# Patient Record
Sex: Male | Born: 1935 | Race: Black or African American | Hispanic: No
Health system: Southern US, Community
[De-identification: ages and names within clinical notes are randomized; demographics above are authoritative.]

## PROBLEM LIST (undated history)

## (undated) DIAGNOSIS — M1990 Unspecified osteoarthritis, unspecified site: Secondary | ICD-10-CM

## (undated) DIAGNOSIS — R7611 Nonspecific reaction to tuberculin skin test without active tuberculosis: Secondary | ICD-10-CM

## (undated) DIAGNOSIS — N289 Disorder of kidney and ureter, unspecified: Secondary | ICD-10-CM

## (undated) DIAGNOSIS — I10 Essential (primary) hypertension: Secondary | ICD-10-CM

## (undated) DIAGNOSIS — R7301 Impaired fasting glucose: Secondary | ICD-10-CM

## (undated) DIAGNOSIS — E119 Type 2 diabetes mellitus without complications: Secondary | ICD-10-CM

## (undated) DIAGNOSIS — N400 Enlarged prostate without lower urinary tract symptoms: Secondary | ICD-10-CM

## (undated) DIAGNOSIS — I739 Peripheral vascular disease, unspecified: Secondary | ICD-10-CM

## (undated) DIAGNOSIS — D649 Anemia, unspecified: Secondary | ICD-10-CM

## (undated) DIAGNOSIS — E785 Hyperlipidemia, unspecified: Secondary | ICD-10-CM

## (undated) DIAGNOSIS — K311 Adult hypertrophic pyloric stenosis: Secondary | ICD-10-CM

## (undated) DIAGNOSIS — R7309 Other abnormal glucose: Secondary | ICD-10-CM

## (undated) HISTORY — DX: Type 2 diabetes mellitus without complications: E11.9

## (undated) HISTORY — DX: Nonspecific reaction to tuberculin skin test without active tuberculosis: R76.11

## (undated) HISTORY — DX: Anemia, unspecified: D64.9

## (undated) HISTORY — DX: Other abnormal glucose: R73.09

## (undated) HISTORY — PX: COLONOSCOPY W/ POLYPECTOMY: SHX1380

## (undated) HISTORY — DX: Hyperlipidemia, unspecified: E78.5

## (undated) HISTORY — DX: Adult hypertrophic pyloric stenosis: K31.1

## (undated) HISTORY — PX: LIVER BIOPSY: SHX301

## (undated) HISTORY — DX: Peripheral vascular disease, unspecified: I73.9

## (undated) HISTORY — PX: CHOLECYSTECTOMY: SHX55

## (undated) HISTORY — DX: Impaired fasting glucose: R73.01

## (undated) HISTORY — DX: Unspecified osteoarthritis, unspecified site: M19.90

## (undated) HISTORY — DX: Benign prostatic hyperplasia without lower urinary tract symptoms: N40.0

## (undated) HISTORY — DX: Essential (primary) hypertension: I10

## (undated) HISTORY — DX: Disorder of kidney and ureter, unspecified: N28.9

---

## 1989-11-10 DIAGNOSIS — R7611 Nonspecific reaction to tuberculin skin test without active tuberculosis: Secondary | ICD-10-CM

## 1989-11-10 HISTORY — DX: Nonspecific reaction to tuberculin skin test without active tuberculosis: R76.11

## 2002-09-02 ENCOUNTER — Encounter: Payer: Self-pay | Admitting: Internal Medicine

## 2002-09-02 ENCOUNTER — Encounter: Admission: RE | Admit: 2002-09-02 | Discharge: 2002-09-02 | Payer: Self-pay | Admitting: Internal Medicine

## 2002-10-31 ENCOUNTER — Encounter: Payer: Self-pay | Admitting: Internal Medicine

## 2002-10-31 ENCOUNTER — Encounter: Admission: RE | Admit: 2002-10-31 | Discharge: 2002-10-31 | Payer: Self-pay | Admitting: Internal Medicine

## 2003-11-28 ENCOUNTER — Encounter: Payer: Self-pay | Admitting: Internal Medicine

## 2004-09-24 ENCOUNTER — Ambulatory Visit: Payer: Self-pay | Admitting: Internal Medicine

## 2005-03-18 ENCOUNTER — Encounter: Admission: RE | Admit: 2005-03-18 | Discharge: 2005-03-18 | Payer: Self-pay | Admitting: Internal Medicine

## 2005-03-18 ENCOUNTER — Ambulatory Visit: Payer: Self-pay | Admitting: Internal Medicine

## 2005-03-26 ENCOUNTER — Ambulatory Visit: Payer: Self-pay | Admitting: Internal Medicine

## 2005-03-27 ENCOUNTER — Encounter: Admission: RE | Admit: 2005-03-27 | Discharge: 2005-03-27 | Payer: Self-pay | Admitting: Internal Medicine

## 2005-10-09 ENCOUNTER — Ambulatory Visit: Payer: Self-pay | Admitting: Internal Medicine

## 2006-03-26 ENCOUNTER — Ambulatory Visit: Payer: Self-pay | Admitting: Internal Medicine

## 2006-04-23 ENCOUNTER — Ambulatory Visit: Payer: Self-pay | Admitting: Internal Medicine

## 2006-08-17 ENCOUNTER — Ambulatory Visit: Payer: Self-pay | Admitting: Internal Medicine

## 2006-09-01 ENCOUNTER — Ambulatory Visit: Payer: Self-pay | Admitting: Internal Medicine

## 2006-11-10 DIAGNOSIS — R7309 Other abnormal glucose: Secondary | ICD-10-CM

## 2006-11-10 HISTORY — DX: Other abnormal glucose: R73.09

## 2007-01-13 ENCOUNTER — Ambulatory Visit: Payer: Self-pay | Admitting: Internal Medicine

## 2007-01-20 ENCOUNTER — Ambulatory Visit: Payer: Self-pay | Admitting: Internal Medicine

## 2007-01-20 LAB — CONVERTED CEMR LAB
ALT: 29 units/L (ref 0–40)
AST: 29 units/L (ref 0–37)
BUN: 21 mg/dL (ref 6–23)
Cholesterol: 160 mg/dL (ref 0–200)
Creatinine, Ser: 1.5 mg/dL (ref 0.4–1.5)
Creatinine,U: 226 mg/dL
HDL: 56.5 mg/dL (ref >39.0)
Hgb A1c MFr Bld: 6.1 % — ABNORMAL HIGH (ref 4.6–6.0)
LDL Cholesterol: 92 mg/dL (ref 0–99)
Microalb Creat Ratio: 2.7 mg/g (ref 0.0–30.0)
Microalb, Ur: 0.6 mg/dL (ref 0.0–1.9)
Total CHOL/HDL Ratio: 2.8
Triglycerides: 60 mg/dL (ref 0–149)
VLDL: 12 mg/dL (ref 0–40)

## 2007-02-09 HISTORY — PX: UPPER GASTROINTESTINAL ENDOSCOPY: SHX188

## 2007-02-12 ENCOUNTER — Ambulatory Visit: Payer: Self-pay | Admitting: Gastroenterology

## 2007-02-12 LAB — CONVERTED CEMR LAB
ALT: 26 units/L (ref 0–40)
Alkaline Phosphatase: 48 units/L (ref 39–117)
BUN: 32 mg/dL — ABNORMAL HIGH (ref 6–23)
Basophils Relative: 0.5 % (ref 0.0–1.0)
CO2: 31 meq/L (ref 19–32)
Calcium: 9.5 mg/dL (ref 8.4–10.5)
Eosinophils Absolute: 0.2 10*3/uL (ref 0.0–0.6)
GFR calc Af Amer: 51 mL/min
GFR calc non Af Amer: 42 mL/min
Lymphocytes Relative: 23.7 % (ref 12.0–46.0)
Monocytes Relative: 13.6 % — ABNORMAL HIGH (ref 3.0–11.0)
Neutro Abs: 4.6 10*3/uL (ref 1.4–7.7)
Platelets: 203 10*3/uL (ref 150–400)
RBC: 4.48 M/uL (ref 4.22–5.81)

## 2007-02-25 ENCOUNTER — Ambulatory Visit (HOSPITAL_COMMUNITY): Admission: RE | Admit: 2007-02-25 | Discharge: 2007-02-25 | Payer: Self-pay | Admitting: Gastroenterology

## 2007-02-25 ENCOUNTER — Encounter (INDEPENDENT_AMBULATORY_CARE_PROVIDER_SITE_OTHER): Payer: Self-pay | Admitting: Specialist

## 2007-03-02 ENCOUNTER — Ambulatory Visit: Payer: Self-pay | Admitting: Gastroenterology

## 2007-04-02 ENCOUNTER — Ambulatory Visit: Payer: Self-pay | Admitting: Gastroenterology

## 2007-04-02 LAB — CONVERTED CEMR LAB: Creatinine, Ser: 1.9 mg/dL — ABNORMAL HIGH (ref 0.4–1.5)

## 2007-04-06 ENCOUNTER — Ambulatory Visit: Payer: Self-pay | Admitting: Cardiology

## 2007-05-17 ENCOUNTER — Ambulatory Visit: Payer: Self-pay | Admitting: Gastroenterology

## 2007-05-20 ENCOUNTER — Encounter: Payer: Self-pay | Admitting: Internal Medicine

## 2007-05-20 ENCOUNTER — Ambulatory Visit (HOSPITAL_COMMUNITY): Admission: RE | Admit: 2007-05-20 | Discharge: 2007-05-20 | Payer: Self-pay | Admitting: Gastroenterology

## 2007-05-20 ENCOUNTER — Encounter: Payer: Self-pay | Admitting: Gastroenterology

## 2007-05-25 ENCOUNTER — Ambulatory Visit: Payer: Self-pay | Admitting: Gastroenterology

## 2007-06-03 ENCOUNTER — Ambulatory Visit (HOSPITAL_COMMUNITY): Admission: RE | Admit: 2007-06-03 | Discharge: 2007-06-03 | Payer: Self-pay | Admitting: Gastroenterology

## 2007-06-03 ENCOUNTER — Encounter: Payer: Self-pay | Admitting: Internal Medicine

## 2007-07-01 ENCOUNTER — Encounter: Payer: Self-pay | Admitting: Gastroenterology

## 2007-07-01 ENCOUNTER — Encounter: Payer: Self-pay | Admitting: Internal Medicine

## 2007-07-01 ENCOUNTER — Ambulatory Visit (HOSPITAL_COMMUNITY): Admission: RE | Admit: 2007-07-01 | Discharge: 2007-07-01 | Payer: Self-pay | Admitting: Gastroenterology

## 2007-07-01 DIAGNOSIS — Q400 Congenital hypertrophic pyloric stenosis: Secondary | ICD-10-CM | POA: Insufficient documentation

## 2007-07-05 ENCOUNTER — Ambulatory Visit: Payer: Self-pay | Admitting: Internal Medicine

## 2007-07-14 ENCOUNTER — Ambulatory Visit: Payer: Self-pay | Admitting: Gastroenterology

## 2008-02-26 DIAGNOSIS — I10 Essential (primary) hypertension: Secondary | ICD-10-CM

## 2008-02-26 DIAGNOSIS — E789 Disorder of lipoprotein metabolism, unspecified: Secondary | ICD-10-CM | POA: Insufficient documentation

## 2008-03-08 ENCOUNTER — Ambulatory Visit: Payer: Self-pay | Admitting: Internal Medicine

## 2008-03-08 DIAGNOSIS — N400 Enlarged prostate without lower urinary tract symptoms: Secondary | ICD-10-CM | POA: Insufficient documentation

## 2008-03-08 DIAGNOSIS — T887XXA Unspecified adverse effect of drug or medicament, initial encounter: Secondary | ICD-10-CM | POA: Insufficient documentation

## 2008-03-08 DIAGNOSIS — R7611 Nonspecific reaction to tuberculin skin test without active tuberculosis: Secondary | ICD-10-CM | POA: Insufficient documentation

## 2008-03-08 DIAGNOSIS — R7989 Other specified abnormal findings of blood chemistry: Secondary | ICD-10-CM | POA: Insufficient documentation

## 2008-03-17 ENCOUNTER — Encounter: Payer: Self-pay | Admitting: Internal Medicine

## 2008-03-20 ENCOUNTER — Encounter (INDEPENDENT_AMBULATORY_CARE_PROVIDER_SITE_OTHER): Payer: Self-pay | Admitting: *Deleted

## 2008-03-20 ENCOUNTER — Ambulatory Visit: Payer: Self-pay | Admitting: Internal Medicine

## 2008-03-20 LAB — CONVERTED CEMR LAB
OCCULT 2: NEGATIVE
OCCULT 3: NEGATIVE

## 2009-03-16 ENCOUNTER — Ambulatory Visit: Payer: Self-pay | Admitting: Internal Medicine

## 2009-03-16 DIAGNOSIS — I4949 Other premature depolarization: Secondary | ICD-10-CM | POA: Insufficient documentation

## 2009-03-16 DIAGNOSIS — R7309 Other abnormal glucose: Secondary | ICD-10-CM

## 2009-03-20 ENCOUNTER — Ambulatory Visit: Payer: Self-pay | Admitting: Internal Medicine

## 2009-03-21 ENCOUNTER — Telehealth (INDEPENDENT_AMBULATORY_CARE_PROVIDER_SITE_OTHER): Payer: Self-pay | Admitting: *Deleted

## 2009-03-21 ENCOUNTER — Encounter (INDEPENDENT_AMBULATORY_CARE_PROVIDER_SITE_OTHER): Payer: Self-pay | Admitting: *Deleted

## 2009-03-26 ENCOUNTER — Encounter (INDEPENDENT_AMBULATORY_CARE_PROVIDER_SITE_OTHER): Payer: Self-pay | Admitting: *Deleted

## 2009-03-26 LAB — CONVERTED CEMR LAB
ALT: 26 units/L (ref 0–53)
Alkaline Phosphatase: 38 units/L — ABNORMAL LOW (ref 39–117)
BUN: 25 mg/dL — ABNORMAL HIGH (ref 6–23)
Basophils Absolute: 0 10*3/uL (ref 0.0–0.1)
Bilirubin, Direct: 0.1 mg/dL (ref 0.0–0.3)
Chloride: 104 meq/L (ref 96–112)
Creatinine, Ser: 1.6 mg/dL — ABNORMAL HIGH (ref 0.4–1.5)
Direct LDL: 132.2 mg/dL
Eosinophils Absolute: 0.1 10*3/uL (ref 0.0–0.7)
Glucose, Bld: 111 mg/dL — ABNORMAL HIGH (ref 70–99)
HCT: 40.7 % (ref 39.0–52.0)
Lymphs Abs: 1.5 10*3/uL (ref 0.7–4.0)
MCHC: 32.7 g/dL (ref 30.0–36.0)
MCV: 92 fL (ref 78.0–100.0)
Monocytes Absolute: 0.7 10*3/uL (ref 0.1–1.0)
Monocytes Relative: 10.2 % (ref 3.0–12.0)
Platelets: 178 10*3/uL (ref 150.0–400.0)
RDW: 12.8 % (ref 11.5–14.6)
TSH: 3.62 microintl units/mL (ref 0.35–5.50)
Total Protein: 7.1 g/dL (ref 6.0–8.3)
VLDL: 15.4 mg/dL (ref 0.0–40.0)

## 2009-03-29 ENCOUNTER — Encounter (INDEPENDENT_AMBULATORY_CARE_PROVIDER_SITE_OTHER): Payer: Self-pay | Admitting: *Deleted

## 2009-09-21 ENCOUNTER — Ambulatory Visit: Payer: Self-pay | Admitting: Internal Medicine

## 2009-09-24 ENCOUNTER — Encounter (INDEPENDENT_AMBULATORY_CARE_PROVIDER_SITE_OTHER): Payer: Self-pay | Admitting: *Deleted

## 2010-03-26 ENCOUNTER — Telehealth (INDEPENDENT_AMBULATORY_CARE_PROVIDER_SITE_OTHER): Payer: Self-pay | Admitting: *Deleted

## 2010-04-19 ENCOUNTER — Ambulatory Visit: Payer: Self-pay | Admitting: Internal Medicine

## 2010-04-19 DIAGNOSIS — H9190 Unspecified hearing loss, unspecified ear: Secondary | ICD-10-CM

## 2010-12-08 LAB — CONVERTED CEMR LAB
ALT: 29 units/L (ref 0–53)
AST: 46 units/L — ABNORMAL HIGH (ref 0–37)
Alkaline Phosphatase: 39 units/L (ref 39–117)
Bilirubin, Direct: 0.1 mg/dL (ref 0.0–0.3)
Calcium: 9.1 mg/dL (ref 8.4–10.5)
Cholesterol: 204 mg/dL — ABNORMAL HIGH (ref 0–200)
Creatinine, Ser: 1.6 mg/dL — ABNORMAL HIGH (ref 0.4–1.5)
Direct LDL: 130.5 mg/dL
Eosinophils Relative: 2.2 % (ref 0.0–5.0)
GFR calc non Af Amer: 52.46 mL/min (ref >60)
HCT: 43.1 % (ref 39.0–52.0)
HDL: 64.5 mg/dL (ref >39.00)
Hemoglobin: 14.4 g/dL (ref 13.0–17.0)
Hgb A1c MFr Bld: 6.1 % (ref 4.6–6.5)
Monocytes Absolute: 0.6 10*3/uL (ref 0.1–1.0)
Monocytes Relative: 8.7 % (ref 3.0–12.0)
Neutro Abs: 4.3 10*3/uL (ref 1.4–7.7)
Sodium: 143 meq/L (ref 135–145)
Total CHOL/HDL Ratio: 3
Triglycerides: 68 mg/dL (ref 0.0–149.0)
VLDL: 13.6 mg/dL (ref 0.0–40.0)
WBC: 6.8 10*3/uL (ref 4.5–10.5)

## 2010-12-10 NOTE — Assessment & Plan Note (Signed)
Summary: med refill /cbs   Vital Signs:  Patient profile:   77 year old male Height:      67.75 inches Weight:      146 pounds BMI:     22.44 Temp:     98.4 degrees F oral Resp:     14 per minute BP sitting:   120 / 68  (left arm) Cuff size:   regular  Vitals Entered By: Georgette Dover (April 19, 2010 11:31 AM) CC: Yearly follow-up and fasting labs , Lipid Management Comments REVIEWED MED LIST, PATIENT AGREED DOSE AND INSTRUCTION CORRECT    CC:  Yearly follow-up and fasting labs  and Lipid Management.  History of Present Illness: Here for Medicare AWV:  1.   Risk factors based on Past M, S, F history:Lipids, fasting hyperglycemia,HTN 2.   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   Depression/mood: denied 4.   Hearing: decreased R> L to loud whisper @ 6 ft 5.   ADL's: no limitations 6.   Fall Risk: denied 7.   Home Safety: none 8.   Height, weight, &visual acuity:vision grossly  intact w/o lenses  9.   Counseling: no restrictions based on history  & exam 10.   Labs ordered based on risk factors: as per Dx (see #1) 11.           Referral Coordination: [facility] referral recommended 12.           Care Plan: see Instructions 13.            Cognitive Assessment: Oriented X 3   Hypertension Follow-Up      This is an 77 year old man who presents for Hypertension follow-up.  The patient denies lightheadedness, urinary frequency, headaches, edema, rash, and fatigue.  The patient denies the following associated symptoms: chest pain, chest pressure, exercise intolerance, dyspnea, palpitations, syncope, and pedal edema.  Compliance with medications (by patient report) has been near 100%.  The patient reports exercising daily.  Adjunctive measures currently used by the patient include salt restriction.    Lipid Management History:      Positive NCEP/ATP III risk factors include male age 75 years old or older, family history for ischemic heart disease (females  less than 82 years old), and hypertension.  Negative NCEP/ATP III risk factors include non-diabetic, non-tobacco-user status, no ASHD (atherosclerotic heart disease), no prior stroke/TIA, no peripheral vascular disease, and no history of aortic aneurysm.     Preventive Screening-Counseling & Management  Alcohol-Tobacco     Alcohol drinks/day: 0     Smoking Status: quit     Year Quit: 1977  Caffeine-Diet-Exercise     Caffeine use/day: 1 cup/day     Diet Comments: none     Does Patient Exercise: yes     Type of exercise: walking on job  Hep-HIV-STD-Contraception     Dental Visit-last 6 months yes  Safety-Violence-Falls     Seat Belt Use: yes     Smoke Detectors: no     Violence in the Home: no risk noted     Fall Risk: no risk by history      Sexual History:  currently monogamous.        Blood Transfusions:  no.    Allergies (verified): No Known Drug Allergies  Past History:  Past Medical History: HYPERLIPIDEMIA (ICD-272.4), LDL goal = < 130 as per [place] Study Fasting hyperglycemia (790.29) HYPERTENSION (ICD-401.9) PYLORIC STENOSIS (ICD-750.5)/pyloric stricture , Dr Ardis Hughs PMH + PPD,  no INH Therapy 1991  Past Surgical History: Cholecystectomy; Liver biopsy of hepatic lesion @ cholecystectomy: benign 1960s ulcers, no surgery ; Endoscopy  02/2007 :pyloric stricture, gastritis, Dr Oretha Caprice  Family History: father-CVA mother-HTN, MI @ 70 M aunt-cancer ? 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 GI:  Denies abdominal pain, bloody stools, dark tarry stools, and indigestion; No dysphagia. Derm:  Denies poor wound healing. Neuro:  Denies numbness and tingling. Endo:  Denies  excessive hunger, excessive thirst, and excessive urination.  Physical Exam  General:  Appears much younger than age,well-nourished,in no acute distress; alert,appropriate and cooperative throughout examination Head:  Normocephalic and atraumatic without obvious abnormalities. Pattern  alopecia or balding. Eyes:  No corneal or conjunctival inflammation noted. Vision grossly normal. Ears:  External ear exam shows no significant lesions or deformities.  Otoscopic examination reveals clear canals, tympanic membranes are intact bilaterally without bulging, retraction, inflammation or discharge. Hearing is grossly  decreased bilaterally. Mouth:  Oral mucosa and oropharynx without lesions or exudates.  Upper plate , lower partial Neck:  No deformities, masses, or tenderness noted. Lungs:  Normal respiratory effort, chest expands symmetrically. Lungs are clear to auscultation, no crackles or wheezes. Heart:  Normal rate and regular rhythm. S1 and S2 normal without gallop, murmur, click, rub .S4 Abdomen:  Bowel sounds positive,abdomen soft and non-tender without masses, organomegaly or hernias noted. OP scar R periumbilical area Msk:  No deformity or scoliosis noted of thoracic or lumbar spine.   Pulses:  R and L carotid,radial,dorsalis pedis and posterior tibial pulses are full and equal bilaterally Extremities:  No clubbing, cyanosis, edema. Flexion contractures 5th fingers Neurologic:  alert & oriented X3, strength normal in all extremities, and DTRs symmetrical and normal.   Skin:  Intact without suspicious lesions or rashes Cervical Nodes:  No lymphadenopathy noted Psych:  memory intact for recent and remote, normally interactive, good eye contact, not anxious appearing, and not depressed appearing.     Impression & Recommendations:  Problem # 1:  PREVENTIVE HEALTH CARE (ICD-V70.0)  Orders: Subsequent annual wellness visit with prevention plan [id number])  Problem # 2:  HEARING LOSS, BILATERAL  (ICD-389.9)  Orders: Audiology (Audio)  Problem # 3:  FASTING HYPERGLYCEMIA (ICD-790.29)  Orders: TLB-A1C / Hgb A1C (Glycohemoglobin) (83036-A1C)  Problem # 4:  HYPERLIPIDEMIA (ICD-272.4)  Orders: TLB-Lipid Panel (80061-LIPID) TLB-Hepatic/Liver Function Pnl (80076-HEPATIC) TLB-TSH (Thyroid Stimulating Hormone) (84443-TSH)  Problem # 5:  HYPERTENSION (ICD-401.9)  controlled His updated medication list for this problem includes:    Hydrochlorothiazide 25 Mg Tabs (Hydrochlorothiazide) .Marland Kitchen... 1/2 tab qd    Benicar 40 Mg Tabs (Olmesartan medoxomil) .Marland Kitchen... 1 by mouth qd  Orders: EKG w/ Interpretation (93000) Venipuncture [id number]) TLB-BMP (Basic Metabolic Panel-BMET) (99991111)  Complete Medication List: 1)  Hydrochlorothiazide 25 Mg Tabs (Hydrochlorothiazide) .... 1/2 tab qd 2)  Benicar 40 Mg Tabs (Olmesartan medoxomil) .Marland Kitchen.. 1 by mouth qd 3)  Baby Asa   Lipid Assessment/Plan:      Based on NCEP/ATP III, the patient's risk factor category is "2 or more risk factors and a calculated 10 year CAD risk of < 20%".  The patient's lipid goals are as follows: Total cholesterol goal is 200; LDL cholesterol goal is 130; HDL  cholesterol goal is 40; Triglyceride goal is 150.    Patient Instructions: 1)  Take an 81 mg coated  Aspirin every day. 2)  It is important that your Diabetic A1c level is checked every 6 months. 3)  Check your Blood Pressure regularly. If it is above: 140/90 ON AVERAGE  you should make an appointment.

## 2010-12-10 NOTE — Progress Notes (Signed)
Summary: lm pt needs appt cpx scheduled [id number]  Phone Note Call from Patient Call back at Home Phone [phone]   Caller: Patient Summary of Call: Message left on VM: Patient would like to schedule an appointment for a check up and needs a refill on Benicar   RX filled for 30day supply, will forward to have appointment scheduled for 32min Yearly check-up Initial call taken by: Georgette Dover,  Mar 26, 2010 1:43 PM  Follow-up for Phone Call        lm am re appt .Marland KitchenArbie [place]  Mar 26, 2010 2:40 PM   Additional Follow-up for Phone Call Additional follow up Details #1::        appt scheduled [id number] Additional Follow-up by: Arbie [place],  Apr 02, 2010 2:53 PM    Prescriptions: BENICAR 40 MG  TABS (OLMESARTAN MEDOXOMIL) 1 by mouth qd  #30 x 0   Entered by:   Georgette Dover   Authorized by:   Unice Cobble MD   Signed by:   Georgette Dover on 03/26/2010   Method used:   Elect[facility]ically to        [id number]       [address]       [place], [place]  [address]       Ph: [phone]       Fax: [phone]   RxID:   [id number]

## 2011-03-11 ENCOUNTER — Encounter: Payer: Self-pay | Admitting: Internal Medicine

## 2011-03-12 ENCOUNTER — Encounter: Payer: Self-pay | Admitting: Internal Medicine

## 2011-03-12 ENCOUNTER — Ambulatory Visit (INDEPENDENT_AMBULATORY_CARE_PROVIDER_SITE_OTHER): Payer: Medicare Other | Admitting: Internal Medicine

## 2011-03-12 DIAGNOSIS — Z87898 Personal history of other specified conditions: Secondary | ICD-10-CM

## 2011-03-12 DIAGNOSIS — R0981 Nasal congestion: Secondary | ICD-10-CM

## 2011-03-12 DIAGNOSIS — R350 Frequency of micturition: Secondary | ICD-10-CM

## 2011-03-12 DIAGNOSIS — R7309 Other abnormal glucose: Secondary | ICD-10-CM

## 2011-03-12 DIAGNOSIS — J3489 Other specified disorders of nose and nasal sinuses: Secondary | ICD-10-CM

## 2011-03-12 LAB — POCT URINALYSIS DIPSTICK
Bilirubin, UA: NEGATIVE
Blood, UA: NEGATIVE
Glucose, UA: NEGATIVE
Ketones, UA: NEGATIVE
Spec Grav, UA: 1.015

## 2011-03-12 NOTE — Patient Instructions (Addendum)
Use the  sample spray twice a day in each nostril. Report fever, frontal headache , facial pain,  Or  yellow-green secretions.Marland Kitchen

## 2011-03-12 NOTE — Progress Notes (Signed)
Subjective:    Patient ID: Ryan Cortez, male    DOB: 03/10/1927, 77 y.o.   MRN: [id number]  HPI Urinary Frequency  Onset: 2 weeks   Progression: actually better with increased water intake    Symptoms:No polydipsia or polyphagia Urgency: no  Hesitancy: no  Hematuria: no  Flank Pain: yes, slightly on R intermittently  Fever: no    Nausea/Vomiting: no   Discharge: no Irritants: no Rash: no  Red Flags  : (Risk Factors for Complicated UTI) Recent Antibiotic Usage (last 30 days): no  Symptoms lasting more than seven (7) days: yes, but better this week  More than 3 UTI's last 12 months: no  PMH of  1. DM: no 2. Renal Disease/Calculi: no 3. Urinary Tract Abnormality: no  4. Instrumentation/Trauma: no 5. Immunosuppression: no                                                                                                                                                                      Upper respiratory tract symptoms Onset/symptoms:2- 3 weeks ago Exposures (illness/environmental/extrinsic):denied Progression of symptoms:stable Treatments/response:Mucinex DM w/o change Present symptoms: Fever/chills/sweats:no Frontal headache:pressure w/o true pain  Facial pain:no Nasal purulence:no Sore throat:no Dental pain:dentures Lymphadenopathy:no Wheezing/shortness of breath:no Cough/sputum/hemoptysis:no Pleuritic pain:no Associated extrinsic/allergic symptoms:itchy eyes/ sneezing:no Past medical history: Seasonal allergies/asthma:no Smoking history:quit 1957         Review of Systems     Objective:   Physical Exam Gen.: Healthy and well-nourished in appearance. Alert, appropriate and cooperative throughout exam. Appears younger than stated age Head: Normocephalic without obvious abnormalities;  Pattern  alopecia  Eyes: No corneal or conjunctival inflammation noted.Ears: External  ear exam reveals no significant lesions or deformities. Canals clear .TMs normal.  Hearing is grossly normal bilaterally. Nose: External nasal exam reveals no deformity or inflammation. Nasal mucosa are pink and moist. No lesions or exudates noted. Septum w/o  Deviation/dislocation. Mouth: Oral mucosa and oropharynx reveal no lesions or exudates. dentures Neck: No deformities, masses, or tenderness noted.  Lungs: Normal respiratory effort; chest expands symmetrically. Lungs are clear to auscultation without rales, wheezes, or increased work of breathing. Heart: Normal rate and rhythm. Normal S1 and S2. No gallop, click, or rub. No murmur. Abdomen: Bowel sounds normal; abdomen soft and nontender. No masses, organomegaly or hernias noted.No flank tenderness. Genitalia: Testicular atrophy is present. There is a granuloma in the right epididymal area. Prostate is upper limits of normal but soft and nontender without nodules.  No clubbing, cyanosis, edema, or deformity noted. Vascular: Carotid, radial artery, dorsalis pedis and dorsalis posterior tibial pulses are full and equal. No bruits present. Neurologic: Alert and oriented x3. Skin: Intact without suspicious lesions or rashes. Lymph: No cervical, axillary, or inguinal lymphadenopathy present. Psych: Mood and affect are normal. Normally interactive                                                                                        Assessment & Plan:  #1 urinary frequency; rule out urinary tract infection. Rule out diabetes  #2 mild flank discomfort  #3 sinus pressure ; criteria for rhinosinusitis are not present  Plan: #1 urinalysis  #2 sample of Nasonex to use twice a day.  #3 A1c

## 2011-03-15 LAB — URINE CULTURE: Organism ID, Bacteria: NO GROWTH

## 2011-03-25 NOTE — Assessment & Plan Note (Signed)
[facility] OFFICE NOTE   Ryan Cortez, SWEENY                         MRN:          [id number]  DATE:05/17/2007                            DOB:          05/23/1927    PRIMARY CARE PHYSICIAN:  Dr. Unice Cobble.   GASTROINTESTINAL PROBLEM LIST:  Pyloric stenosis unclear etiology,  presented with early satiety and weight loss April 2008. EGD found  gastritis, incomplete pyloric stricture causing partial gastric outlet  obstruction, CLO biopsies negative. No neoplasm or dysplasia, on  biopsies there was focal intestinal metaplasia. CT scan showed pyloric  thickening but no other abnormalities, no signs of obvious neoplasm.   INTERVAL HISTORY:  I last saw Hiren in April of 2008. After his CT scan  we tried several times to contact him with the results without much  success. We eventually sent him a letter by mail at the end of May which  he responded to and he is showing up for that appointment now. He tells  me he was out of town and was not checking his messages regularly. He  still has early satiety and has lost another 6 pounds since his last  visit here. He says his heartburn is much better on Aciphex once daily.   CURRENT MEDICATIONS:  Pravastatin, hydrochlorothiazide, metformin,  Benicar, and Aciphex.   PHYSICAL EXAMINATION:  Weight 134 pounds which is down 6 pounds since  his last visit, blood pressure 120/62, pulse 80.  CONSTITUTIONAL: Thin otherwise well-appearing.  NEUROLOGIC: Alert and oriented x3.  ABDOMEN: Soft, nontender, nondistended. Normal bowel sounds.  EXTREMITIES: No lower extremity edema.   ASSESSMENT/PLAN:  A 77 year old man with early satiety, weight loss,  pyloric stricture.   He does not take a lot of NSAIDs except for a baby aspirin once daily  and so I doubt that this pyloric stricture is NSAID related. His CLO  biopsy is negative. It did not look malignant endoscopically and the  CT  scan did not show any signs of overt malignancy. My plan is to repeat  his EGD in the next 2 to 3 days, get another look at it to rebiopsy it.  He is losing weight and if my biopsies are inconclusive I will probably  send him for surgical evaluation, and consider distal gastrectomy.     Milus Banister, MD  Electronically Signed   DPJ/MedQ  DD: 05/17/2007  DT: 05/17/2007  Job #: [id number]   cc:   Darrick Penna. Linna Darner, MD,FACP,FCCP

## 2011-03-28 NOTE — Assessment & Plan Note (Signed)
[facility] OFFICE NOTE   JEHOVAH, ESKRA                         MRN:          [id number]  DATE:01/20/2007                            DOB:          [birth year]    Mr. Lempert has had intermittent vague GI symptoms which he described as  his stomach is nervous.  This has been associated with anorexia. He  has also had dysphagia, approximately 2 times a week for several weeks.  He denies any melena.  He also has had an approximately 5 pound weight  loss.   His past medical history includes cholecystectomy.  At that time he had  a liver biopsy, and there was no definite pathologic process diagnosed.  He also has had a history of ulcers.  In 1991 a PPD was positive; he was  not treated with INH because of his age.  He has medical problems of  hypertension, hypercholesterolemia, prostatic hypertrophy and peripheral  vascular disease.   His creatinine has been as high as 1.8.   FAMILY HISTORY:  Positive high blood pressure, heart attack and  alcoholism.   He quit smoking in 1977.  His does not drink.  He has worked in [employer]  which requires significant walking.   He is on:  1. Benicar 10 mg daily.  2. Enteric-coated aspirin 81 mg daily.  3. Hydrochlorothiazide 25 mg 1/2 daily.  4. Pravastatin 40 mg at bedtime.   He has no known drug allergies.   Blood pressure was 116/64, pulse 68, respiratory rate 16.  He had no icterus.  He is  not jaundiced.  An S4 was noted with a grade 1 systolic murmur.  CHEST:  Clear.  He had no organomegaly or lymphadenopathy.  There was no abdominal  tenderness.   His lipids were at goal on the pravastatin and SGOT and SGPT were both  normal.  The creatinine is now 1.5.  A1c was 6.1%; he had no  microalbumin.  Hyperglycemic carbohydrate restriction will be  recommended, along with avoidance of high-fructose-containing foods.  He  was placed on metformin CR 500 mg 1 daily.   BUN, creatinine and A1c will  be rechecked in 8 weeks. GI consult will be pursued because of the  anorexia, weight loss, & dysphagia.    Darrick Penna. Linna Darner, MD,FACP,FCCP  Electronically Signed   WF[facility]/MedQ  DD: 01/25/2007  DT: 01/25/2007  Job #: [id number]

## 2011-03-28 NOTE — Assessment & Plan Note (Signed)
[facility] OFFICE NOTE   Ryan Cortez, Ryan Cortez                         MRN:          [id number]  DATE:02/12/2007                            DOB:          [birth year]    REFERRING PHYSICIAN:  Darrick Penna. Linna Darner, MD,FACP,FCCP   GI CONSULTATION:   REASON FOR REFERRAL:  Dr. Linna Cortez asked me to evaluate Ryan Cortez in  consultation regarding early satiety.   HISTORY OF PRESENT ILLNESS:  Ryan Cortez is a very pleasant 77 year old  man who has noticed approximately three months of early satiety.  He  says food goes down fine, but it seems to just sit in his stomach and  does not digest quickly.  He gets early very fast and is not eating  nearly as much as he used to.  He believes that he has lost about 10  pounds in the past three months.  He has no true dysphagia symptoms.  He  used to have burning off and on and would take a small pink-colored  pill, which I believe is probably a proton pump inhibitor.  He would  take this intermittently over the past many years and said with that,  the burning would definitely improve.  He has noticed that in the past  few months, the burning has returned.  He has not been taking any  specific medicines for it.   He has had no nausea, no vomiting, no overt GI bleeding.   REVIEW OF SYSTEMS:  Notable for a 10 pound weight loss, otherwise  essentially normal and is available on the nursing intake sheet.   PAST MEDICAL HISTORY:  1. Hypertension.  2. Elevated cholesterol.  3. Status post cholecystectomy.  4. Borderline diabetes.   CURRENT MEDICATIONS:  Pravastatin, hydrochlorothiazide, Metformin,  Benicar.   ALLERGIES:  No known drug allergies.   SOCIAL HISTORY:  Divorced.  Lives by himself.  Retired from [employer].  Nonsmoker.  Nondrinker.  Does not drink much caffeine.   FAMILY HISTORY:  No colon cancer or colon polyps in the family.   PHYSICAL EXAMINATION:  VITAL SIGNS:   Height 5 feet 9 inches.  Weight 140  pounds.  Blood pressure 140/70, pulse 82.  CONSTITUTIONAL:  Thin-appearing but not very chronically ill-appearing.  NEUROLOGIC:  Alert and oriented x3.  HEENT:  Extraocular movements are intact.  Mouth:  Oropharynx is moist.  No lesions.  NECK:  Supple.  No lymphadenopathy.  CARDIOVASCULAR:  Regular rate and rhythm.  LUNGS:  Clear to auscultation bilaterally.  ABDOMEN:  Soft, nontender, nondistended.  Normal bowel sounds.  Right  vertical scar from previous open cholecystectomy.  EXTREMITIES:  No lower extremity edema.  SKIN:  No rashes or lesions on the visible extremities.   ASSESSMENT/PLAN:  A 77 year old man with early satiety, abdominal  burning, weight loss.   The burning that he describes may be gastroesophageal reflux disease  related, and it sounds like that it what he has had in the past and got  relief from proton pump inhibitors.  So I am giving him samples of  proton pump inhibitor  and he is instructed to take 20-30 minutes prior  to his breakfast meal on a daily basis.  We will also arrange for him to  have a CBC, a complete metabolic profile and thyroid testing done today.  He will have an EEG performed at his soonest convenience.  Concern about  peptic stricturing, ulcer disease, and perhaps neoplasm.  If the EGD  does not give the answer, I will proceed to CT scan of abdomen and  possibly pelvis.     Ryan Banister, MD  Electronically Signed    DPJ/MedQ  DD: 02/12/2007  DT: 02/12/2007  Job #: [id number]   cc:   Darrick Penna. Linna Darner, MD,FACP,FCCP

## 2011-04-19 ENCOUNTER — Encounter: Payer: Self-pay | Admitting: Internal Medicine

## 2011-04-22 ENCOUNTER — Ambulatory Visit (INDEPENDENT_AMBULATORY_CARE_PROVIDER_SITE_OTHER): Payer: Medicare Other | Admitting: Internal Medicine

## 2011-04-22 ENCOUNTER — Encounter: Payer: Self-pay | Admitting: Internal Medicine

## 2011-04-22 VITALS — BP 150/80 | HR 72 | Temp 98.6°F | Resp 14 | Ht 68.0 in | Wt 148.0 lb

## 2011-04-22 DIAGNOSIS — Z87898 Personal history of other specified conditions: Secondary | ICD-10-CM

## 2011-04-22 DIAGNOSIS — E785 Hyperlipidemia, unspecified: Secondary | ICD-10-CM

## 2011-04-22 DIAGNOSIS — Z136 Encounter for screening for cardiovascular disorders: Secondary | ICD-10-CM

## 2011-04-22 DIAGNOSIS — N189 Chronic kidney disease, unspecified: Secondary | ICD-10-CM

## 2011-04-22 DIAGNOSIS — Z0000 Encounter for general adult medical examination without abnormal findings: Secondary | ICD-10-CM

## 2011-04-22 DIAGNOSIS — I10 Essential (primary) hypertension: Secondary | ICD-10-CM

## 2011-04-22 DIAGNOSIS — R7309 Other abnormal glucose: Secondary | ICD-10-CM

## 2011-04-22 LAB — BASIC METABOLIC PANEL
CO2: 28 mEq/L (ref 19–32)
Glucose, Bld: 91 mg/dL (ref 70–99)
Potassium: 4.6 mEq/L (ref 3.5–5.1)
Sodium: 137 mEq/L (ref 135–145)

## 2011-04-22 LAB — HEPATIC FUNCTION PANEL
ALT: 23 U/L (ref 0–53)
Bilirubin, Direct: 0 mg/dL (ref 0.0–0.3)
Total Bilirubin: 0.6 mg/dL (ref 0.3–1.2)

## 2011-04-22 LAB — CBC WITH DIFFERENTIAL/PLATELET
Basophils Absolute: 0 10*3/uL (ref 0.0–0.1)
Basophils Relative: 0.4 % (ref 0.0–3.0)
HCT: 38.5 % — ABNORMAL LOW (ref 39.0–52.0)
Hemoglobin: 12.7 g/dL — ABNORMAL LOW (ref 13.0–17.0)
Lymphs Abs: 2.6 10*3/uL (ref 0.7–4.0)
MCHC: 33 g/dL (ref 30.0–36.0)
Monocytes Relative: 9 % (ref 3.0–12.0)
Neutro Abs: 5.2 10*3/uL (ref 1.4–7.7)
RBC: 4.31 Mil/uL (ref 4.22–5.81)
RDW: 14.4 % (ref 11.5–14.6)

## 2011-04-22 LAB — TSH: TSH: 4.02 u[IU]/mL (ref 0.35–5.50)

## 2011-04-22 LAB — LIPID PANEL
HDL: 76.5 mg/dL (ref >39.00)
VLDL: 22.8 mg/dL (ref 0.0–40.0)

## 2011-04-22 MED ORDER — HYDROCHLOROTHIAZIDE 25 MG PO TABS: 25.0000 mg | ORAL_TABLET | ORAL | Status: DC

## 2011-04-22 NOTE — Progress Notes (Signed)
Subjective:    Patient ID: Ryan Cortez, male    DOB: 11-Aug-1927, 77 y.o.   MRN: [id number]  HPI  Medicare Wellness Visit:  The following psychosocial & medical history were reviewed as required by Medicare.   Social history: caffeine: 1 cup/ day , alcohol:  no,  tobacco use : quit 1977  & exercise : does manual work almost daily .   Home & personal  safety / fall risk: no issues, activities of daily living: no limitations , seatbelt use : yes , and smoke alarm employment : yes.  Power of Attorney/Living Will status : needed  Vision ( as recorded per Nurse) & Hearing  evaluation :  decreased to whisper @ 6 ft; wall chart read with lenses. Orientation :oriented X 3 , memory & recall :poor, spelling : "WORLD" spelled " backward:"DLORW",and mood & affect : normal . Depression / anxiety: denied Travel history : [place] 1951 , immunization status :risk discussed , transfusion history:  no, and preventive health surveillance ( colonoscopies, BMD , etc as per protocol/ SOC): no colonoscopy to date; ENDO 2008, Dental care:  dentures . Chart reviewed &  Updated. Active issues reviewed & addressed.     Review of Systems     Objective:   Physical Exam Gen.: Thin but healthy and well-nourished in appearance. Alert, appropriate and cooperative throughout exam. Appears younger than age Head: Normocephalic without obvious abnormalities;  Pattern  alopecia  Eyes: No corneal or conjunctival inflammation noted. Pupils equal round reactive to light and accommodation. Fundal exam is benign without hemorrhages, exudate, papilledema. Extraocular motion intact. Vision grossly normal. Ears: Wax on R; L TM normal. Hearing is grossly decreased  bilaterally. Nose: External nasal exam reveals no deformity or inflammation. Nasal mucosa are pink and moist. No lesions or exudates noted. Decreased flow L nareMouth: Oral mucosa and oropharynx reveal no lesions or exudates. dentures Neck: No deformities, masses, or  tenderness noted. Range of motion &. Thyroid normal. Lungs: Normal respiratory effort; chest expands symmetrically. Lungs are clear to auscultation without rales, wheezes, or increased work of breathing. Decreased BS Heart: Normal rate and rhythm. Normal S1 and S2. No gallop, click, or rub. No  murmur. Abdomen: Bowel sounds normal; abdomen soft and nontender. No masses, organomegaly or hernias noted. Genitalia: deferred due to age                                                                                      Musculoskeletal/extremities: No deformity or scoliosis noted of  the thoracic or lumbar spine. No clubbing, cyanosis, edema, or deformity noted. Range of motion  normal .Tone & strength  Normal.Flexion contracture L 5th digit. Nail health  good. Vascular: Carotid, radial artery, dorsalis pedis and dorsalis posterior tibial pulses are full and equal. No bruits present. Neurologic: Alert and oriented x3. Deep tendon reflexes symmetrical and normal.         Skin: Intact without suspicious lesions or rashes. Lymph: No cervical, axillary, or inguinal lymphadenopathy present. Psych: Mood and affect are normal. Normally interactive  Assessment & Plan:  #1 Medicare Wellness Exam; criteria met ; data entered #2 Problem List reviewed ; Assessment/ Recommendations made Plan: see Orders

## 2011-04-22 NOTE — Patient Instructions (Signed)
Preventive Health Care: Exercise at least 30-45 minutes a day,  3-4 days a week.  Consider referral to [facility] - have an eye exam @ least annually.                                                         Health Care Power of [place]. Complete if not in place ; these place you in charge of your health care decisions.

## 2011-04-23 ENCOUNTER — Encounter: Payer: Self-pay | Admitting: Internal Medicine

## 2011-04-23 LAB — LDL CHOLESTEROL, DIRECT: Direct LDL: 144 mg/dL

## 2011-04-23 MED ORDER — FUROSEMIDE 40 MG PO TABS: 40.0000 mg | ORAL_TABLET | Freq: Every day | ORAL | Status: DC

## 2011-04-23 NOTE — Progress Notes (Signed)
Addended by: Secundino Ginger on: 04/23/2011 01:13 PM   Modules accepted: Orders

## 2011-06-19 ENCOUNTER — Ambulatory Visit (INDEPENDENT_AMBULATORY_CARE_PROVIDER_SITE_OTHER): Payer: Medicare Other | Admitting: Internal Medicine

## 2011-06-19 ENCOUNTER — Encounter: Payer: Self-pay | Admitting: Internal Medicine

## 2011-06-19 DIAGNOSIS — M549 Dorsalgia, unspecified: Secondary | ICD-10-CM

## 2011-06-19 DIAGNOSIS — R490 Dysphonia: Secondary | ICD-10-CM

## 2011-06-19 DIAGNOSIS — N189 Chronic kidney disease, unspecified: Secondary | ICD-10-CM

## 2011-06-19 DIAGNOSIS — I10 Essential (primary) hypertension: Secondary | ICD-10-CM

## 2011-06-19 NOTE — Progress Notes (Signed)
  Subjective:    Patient ID: Ryan Cortez, male    DOB: [birth year], 77 y.o.   MRN: [id number]  HPI Back Pain: Location: LS   Onset: intermittently in past, ?worse post Lasix. He brings in the package insert with highlights of dry mouth, decreased urination electrolyte problems, loss of appetite, restlessness.   Severity: up to 5 Pain is described as: aching  Better with: ambulation  Pain radiates to: no   Impaired range of motion: no History of repetitive motion:  yes, lifts 40 # 4-5 X/day  History of trauma:  no   Past history of similar problem:  yes, ? Symptoms Numbness/tingling:  Not associated with pain Weakness: no Red Flags Fever:  no  Bowel/bladder dysfunction:  no       Review of Systems he complains of some hoarseness.     Objective:   Physical Exam he is in no acute distress; he is then but appears well muscled well-nourished. He appears  much less younger than his age.  He has no lymphadenopathy about the head, neck, or axilla.  Thyroid is not enlarged; no nodules are palpable  He has no flank tenderness to percussion. He is able to lie back and sit up without help.  Strength in extremities is good and equal. Negative SLR  Degenerative reflexes are normal except for 1/2+ at the right knee  Abdomen is flat and well muscled. He has no aortic aneurysm.         Assessment & Plan:  #1 low back pain most likely related to repetitive lifting  #2 hoarseness; normal thyroid exam. TSH normal in June of this year  #3 hypertension adequately controlled  #4 renal insufficiency; it was for this reason that the Lasix was prescribed.  Plan: I reassured him that the case symptoms are not related to the diuretic. He would take this medication can provide adequate diuresis in view of this kidney impairment. He can try half a pill if he wishes.

## 2011-06-19 NOTE — Patient Instructions (Signed)
BUN & creatinine assess kidney function. To protect the kidneys it  is important to control your blood pressure and sugar. You should also stay well hydrated. Drink to thirst, up to 40 ounces of water a day. Progressive kidney impairment is typically associated with anemia which is unrelated to  iron deficiency or B12 deficiency.

## 2011-06-20 LAB — CREATININE, SERUM: Creatinine, Ser: 2 mg/dL — ABNORMAL HIGH (ref 0.4–1.5)

## 2011-06-20 LAB — POTASSIUM: Potassium: 4.3 mEq/L (ref 3.5–5.1)

## 2011-06-20 LAB — TSH: TSH: 3.52 u[IU]/mL (ref 0.35–5.50)

## 2011-07-06 ENCOUNTER — Encounter: Payer: Self-pay | Admitting: Internal Medicine

## 2011-08-11 ENCOUNTER — Encounter: Payer: Self-pay | Admitting: Internal Medicine

## 2011-08-11 ENCOUNTER — Ambulatory Visit (INDEPENDENT_AMBULATORY_CARE_PROVIDER_SITE_OTHER): Payer: Medicare Other | Admitting: Internal Medicine

## 2011-08-11 DIAGNOSIS — I10 Essential (primary) hypertension: Secondary | ICD-10-CM

## 2011-08-11 DIAGNOSIS — N189 Chronic kidney disease, unspecified: Secondary | ICD-10-CM

## 2011-08-11 DIAGNOSIS — N259 Disorder resulting from impaired renal tubular function, unspecified: Secondary | ICD-10-CM

## 2011-08-11 DIAGNOSIS — Z87898 Personal history of other specified conditions: Secondary | ICD-10-CM

## 2011-08-11 MED ORDER — AMLODIPINE BESYLATE 2.5 MG PO TABS: 2.5000 mg | ORAL_TABLET | Freq: Every day | ORAL | Status: DC

## 2011-08-11 NOTE — Progress Notes (Signed)
  Subjective:    Patient ID: Ryan Cortez, male    DOB: December 25, 1926, 77 y.o.   MRN: [id number]  HPI  HYPERTENSION: Disease Monitoring  Blood pressure range: not checked  Chest pain: no   Dyspnea: no   Claudication: no  Medication compliance: yes,   Medication Side Effects  Lightheadedness: no   Urinary frequency: yes, with Lasix   Edema: no   Preventitive Healthcare:  Exercise: yes, walking   Diet Pattern: no  Salt Restriction: modified restriction      Review of Systems  BUN has risen from 25 to a value of 34 and creatinine from 1.7 up to 2.0 in the past 4 months. His creatinine has been as low as 1.6 and as high as 1.9 the last 4 years.  His A1c have been well controlled over the last 2 years; high A1c was 6.2%  He has a history of prostatic hypertrophy but he denies any difficulty urinating such as dribbling, frequency or oliguria.     Objective:   Physical Exam  He is thin and well nourished; he appears much younger that his stated age  Chest is clear with no rhonchi, rales or increased work of breathing  Heart rhythm is regular with an S4. No significant murmurs  He has no aortic aneurysm or renal artery bruits  He has no significant clubbing, edema or cyanosis  All pulses are intact although the pedal pulses are slightly decreased.        Assessment & Plan:  #1 hypertension, suboptimal control  #2 renal insufficiency, slightly worse  Plan: See orders and recommendations

## 2011-08-11 NOTE — Patient Instructions (Signed)
Your BP goal = AVERAGE < 135/85. Avoid ingestion of  excess salt/sodium.Cook with pepper & other spices . Use the salt substitute "No Salt"(unless your potassium has been elevated) OR the Mrs Deliah Boston products to season food @ the table. Avoid foods which taste salty or "vinegary" as their sodium contentet will be high.  Please  schedule fasting Labs in 3 months : BMET, CBC & dif, A1c.  Please bring these instructions to that Lab appt.

## 2011-08-12 ENCOUNTER — Other Ambulatory Visit: Payer: Self-pay | Admitting: *Deleted

## 2011-08-12 MED ORDER — FUROSEMIDE 40 MG PO TABS: 40.0000 mg | ORAL_TABLET | Freq: Every day | ORAL | Status: DC

## 2011-08-12 MED ORDER — OLMESARTAN MEDOXOMIL 40 MG PO TABS: 40.0000 mg | ORAL_TABLET | Freq: Every day | ORAL | Status: DC

## 2011-08-12 NOTE — Telephone Encounter (Signed)
Rx sent 

## 2011-11-10 ENCOUNTER — Other Ambulatory Visit: Payer: Self-pay | Admitting: Internal Medicine

## 2011-11-10 DIAGNOSIS — I10 Essential (primary) hypertension: Secondary | ICD-10-CM

## 2011-11-10 DIAGNOSIS — R7309 Other abnormal glucose: Secondary | ICD-10-CM

## 2011-11-10 DIAGNOSIS — N189 Chronic kidney disease, unspecified: Secondary | ICD-10-CM

## 2011-11-12 ENCOUNTER — Other Ambulatory Visit (INDEPENDENT_AMBULATORY_CARE_PROVIDER_SITE_OTHER): Payer: Medicare Other

## 2011-11-12 DIAGNOSIS — N189 Chronic kidney disease, unspecified: Secondary | ICD-10-CM

## 2011-11-12 DIAGNOSIS — I10 Essential (primary) hypertension: Secondary | ICD-10-CM | POA: Diagnosis not present

## 2011-11-12 DIAGNOSIS — R7309 Other abnormal glucose: Secondary | ICD-10-CM

## 2011-11-12 LAB — BASIC METABOLIC PANEL
BUN: 41 mg/dL — ABNORMAL HIGH (ref 6–23)
Chloride: 105 mEq/L (ref 96–112)
GFR: 36.78 mL/min — ABNORMAL LOW (ref >60.00)
Glucose, Bld: 95 mg/dL (ref 70–99)
Potassium: 4.9 mEq/L (ref 3.5–5.1)
Sodium: 139 mEq/L (ref 135–145)

## 2011-11-12 LAB — CBC WITH DIFFERENTIAL/PLATELET
Basophils Absolute: 0 10*3/uL (ref 0.0–0.1)
Eosinophils Relative: 4.2 % (ref 0.0–5.0)
HCT: 34.7 % — ABNORMAL LOW (ref 39.0–52.0)
Hemoglobin: 11.4 g/dL — ABNORMAL LOW (ref 13.0–17.0)
Lymphs Abs: 2.5 10*3/uL (ref 0.7–4.0)
MCV: 90.8 fl (ref 78.0–100.0)
Monocytes Absolute: 0.8 10*3/uL (ref 0.1–1.0)
Monocytes Relative: 10.7 % (ref 3.0–12.0)
Neutro Abs: 4 10*3/uL (ref 1.4–7.7)
RDW: 14.1 % (ref 11.5–14.6)

## 2011-12-23 ENCOUNTER — Ambulatory Visit (INDEPENDENT_AMBULATORY_CARE_PROVIDER_SITE_OTHER): Payer: Medicare Other | Admitting: Internal Medicine

## 2011-12-23 ENCOUNTER — Encounter: Payer: Self-pay | Admitting: Internal Medicine

## 2011-12-23 DIAGNOSIS — IMO0002 Reserved for concepts with insufficient information to code with codable children: Secondary | ICD-10-CM

## 2011-12-23 DIAGNOSIS — I10 Essential (primary) hypertension: Secondary | ICD-10-CM

## 2011-12-23 DIAGNOSIS — G609 Hereditary and idiopathic neuropathy, unspecified: Secondary | ICD-10-CM | POA: Diagnosis not present

## 2011-12-23 DIAGNOSIS — G629 Polyneuropathy, unspecified: Secondary | ICD-10-CM

## 2011-12-23 DIAGNOSIS — R7309 Other abnormal glucose: Secondary | ICD-10-CM | POA: Diagnosis not present

## 2011-12-23 DIAGNOSIS — N289 Disorder of kidney and ureter, unspecified: Secondary | ICD-10-CM

## 2011-12-23 DIAGNOSIS — M5416 Radiculopathy, lumbar region: Secondary | ICD-10-CM

## 2011-12-23 DIAGNOSIS — D649 Anemia, unspecified: Secondary | ICD-10-CM

## 2011-12-23 DIAGNOSIS — E785 Hyperlipidemia, unspecified: Secondary | ICD-10-CM | POA: Diagnosis not present

## 2011-12-23 DIAGNOSIS — R0989 Other specified symptoms and signs involving the circulatory and respiratory systems: Secondary | ICD-10-CM

## 2011-12-23 DIAGNOSIS — Z87898 Personal history of other specified conditions: Secondary | ICD-10-CM | POA: Diagnosis not present

## 2011-12-23 MED ORDER — GABAPENTIN 100 MG PO CAPS: ORAL_CAPSULE | ORAL | Status: DC

## 2011-12-23 MED ORDER — OLMESARTAN MEDOXOMIL 40 MG PO TABS: 40.0000 mg | ORAL_TABLET | Freq: Every day | ORAL | Status: DC

## 2011-12-23 MED ORDER — AMLODIPINE BESYLATE 5 MG PO TABS: 5.0000 mg | ORAL_TABLET | Freq: Every day | ORAL | Status: DC

## 2011-12-23 NOTE — Assessment & Plan Note (Signed)
His blood pressure is suboptimally controlled; this may be the cause of the progressive renal insufficiency. See orders

## 2011-12-23 NOTE — Assessment & Plan Note (Signed)
The degree of BPH should not cause hydronephrosis; PSA will be checked

## 2011-12-23 NOTE — Assessment & Plan Note (Signed)
His advanced cholesterol testing does not suggest increased risk with the LDL of 144; but he does have a left carotid bruit. Carotid Doppler will be performed. His echo his HDL is protective

## 2011-12-23 NOTE — Patient Instructions (Addendum)
BUN, creatinine, and GFR  all assess kidney function. To protect the kidneys it  is important to control your blood pressure and sugar. You should also stay well hydrated. Drink to thirst, up to 40 ounces of water a day. Progressive kidney impairment is typically associated with anemia which is unrelated to  iron deficiency or B12 deficiency. Blood Pressure Goal  Ideally is an AVERAGE < 135/85. This AVERAGE should be calculated from @ least 5-7 BP readings taken @ different times of day on different days of week. You should not respond to isolated BP readings , but rather the AVERAGE for that week   PLEASE BRING THESE INSTRUCTIONS TO FOLLOW UP  LAB APPOINTMENT for BMET in 10 weeks.This will guarantee correct labs are drawn, eliminating need for repeat blood sampling ( needle sticks ! ). Diagnoses /Codes: U8646187.9

## 2011-12-23 NOTE — Assessment & Plan Note (Signed)
A1 C. is excellent; no medication required

## 2011-12-23 NOTE — Progress Notes (Signed)
Subjective:    Patient ID: Ryan Cortez, male    DOB: [birth year], 78 y.o.   MRN: [id number]  HPI HYPERTENSION: Disease Monitoring: Blood pressure range-not checked regularly Chest pain, palpitations- no       Dyspnea- no Medications: Compliance- no  Lightheadedness,Syncope- no   Edema- no Creatinine 2.2, BUN 41, GFR 36.7809/12/23  DIABETES: Disease Monitoring: Blood Sugar ranges-not monitored Polyuria/phagia/dipsia- no       Visual problems- no Medications:none Diet: no but restricts salt  A1c was 6.2 on 11/12/11.  HYPERLIPIDEMIA: Disease Monitoring: See symptoms for Hypertension Medications: Compliance- no statin On 04/22/2011 his LDL was 144 and HDL 76.5. TG were 114.            Review of Systems Abd pain, bowel changes-no   Muscle aches- cramps in calves @ night occasionally  For several weeks he describes intermittent throbbing low back pain with intermittent radiation into the left thigh. He will intermittently have numbness and tingling in the same area. He denies stool or urinary incontinence. He denies taking any medication for this.  He denies any history of prostate disease.     Objective:   Physical Exam Gen.: Thin but healthy and well-nourished in appearance. Alert, appropriate and cooperative throughout exam.Appears younger than stated age   Eyes: No corneal or conjunctival inflammation noted.  Neck: No deformities, masses, or tenderness noted. Range of motion & Thyroidnormal  Lungs: Normal respiratory effort; chest expands symmetrically. Lungs are clear to auscultation without rales, wheezes, or increased work of breathing.Decreased BS Heart: Normal rate and rhythm. Normal S1 and S2. No gallop, click, or rub. Grade 1/6 systolic murmur . Abdomen: Bowel sounds normal; abdomen soft and nontender. No masses, organomegaly or hernias noted.No AAA  DRE: Prostate is mildly enlarged; there is no asymmetry or nodules present.                                                                                    Musculoskeletal/extremities: No deformity or scoliosis noted of  the thoracic or lumbar spine;but  the right paraspinous muscles a larger than the left. No clubbing, cyanosis, edema, or deformity noted. Range of motion  normal .Tone & strength  normal.Joints normal. Nail health  good. He is able to lie back and sit up without help. Straight leg raising is negative to 90 Vascular: Carotid, radial artery, and  posterior tibial pulses are full and equal. Decreased DPP.L carotid  bruit present. Neurologic: Alert and oriented x3. Deep tendon reflexes ssymmetric; right knee reflex 0-1/2+. Foot drop noted on the left with heel walking. Tiptoe walking is normal.      Skin: Intact without suspicious lesions or rashes. Lymph: No cervical, axillary, or inguinal lymphadenopathy present. Psych: Mood and affect are normal. Normally interactive  Assessment & Plan:

## 2011-12-24 LAB — CBC WITH DIFFERENTIAL/PLATELET
Basophils Relative: 0.5 % (ref 0.0–3.0)
Eosinophils Absolute: 0.2 10*3/uL (ref 0.0–0.7)
Hemoglobin: 11.4 g/dL — ABNORMAL LOW (ref 13.0–17.0)
MCHC: 32.8 g/dL (ref 30.0–36.0)
MCV: 90.2 fl (ref 78.0–100.0)
Monocytes Absolute: 0.6 10*3/uL (ref 0.1–1.0)
Neutro Abs: 1.5 10*3/uL (ref 1.4–7.7)
RBC: 3.85 Mil/uL — ABNORMAL LOW (ref 4.22–5.81)
RDW: 14.3 % (ref 11.5–14.6)

## 2011-12-24 LAB — IBC PANEL
Iron: 36 ug/dL — ABNORMAL LOW (ref 42–165)
Saturation Ratios: 9.5 % — ABNORMAL LOW (ref 20.0–50.0)
Transferrin: 270.1 mg/dL (ref 212.0–360.0)

## 2011-12-26 ENCOUNTER — Other Ambulatory Visit: Payer: Self-pay | Admitting: Internal Medicine

## 2011-12-26 DIAGNOSIS — D509 Iron deficiency anemia, unspecified: Secondary | ICD-10-CM

## 2012-01-06 ENCOUNTER — Other Ambulatory Visit: Payer: Self-pay | Admitting: *Deleted

## 2012-01-06 ENCOUNTER — Ambulatory Visit (INDEPENDENT_AMBULATORY_CARE_PROVIDER_SITE_OTHER): Payer: Medicare Other

## 2012-01-06 DIAGNOSIS — R0989 Other specified symptoms and signs involving the circulatory and respiratory systems: Secondary | ICD-10-CM

## 2012-01-06 DIAGNOSIS — E785 Hyperlipidemia, unspecified: Secondary | ICD-10-CM

## 2012-01-06 DIAGNOSIS — I10 Essential (primary) hypertension: Secondary | ICD-10-CM

## 2012-01-06 DIAGNOSIS — I6529 Occlusion and stenosis of unspecified carotid artery: Secondary | ICD-10-CM

## 2012-01-13 ENCOUNTER — Ambulatory Visit (INDEPENDENT_AMBULATORY_CARE_PROVIDER_SITE_OTHER): Payer: Medicare Other | Admitting: Gastroenterology

## 2012-01-13 ENCOUNTER — Encounter: Payer: Self-pay | Admitting: Gastroenterology

## 2012-01-13 DIAGNOSIS — D509 Iron deficiency anemia, unspecified: Secondary | ICD-10-CM

## 2012-01-13 MED ORDER — PEG-KCL-NACL-NASULF-NA ASC-C 100 G PO SOLR: 1.0000 | ORAL | Status: DC

## 2012-01-13 NOTE — Patient Instructions (Addendum)
You will be set up for a colonoscopy for iron deficiency anemia. You will be set up for an upper endoscopy at [facility] for iron deficiency anemia.

## 2012-01-13 NOTE — Progress Notes (Signed)
Review of pertinent gastrointestinal problems: 1.  Pyloric stenosis ; likely benign presented with early satiety and weight loss April 2008. EGD found gastritis, incomplete pyloric stricture causing partial gastric outlet obstruction, CLO biopsies negative. No neoplasm or dysplasia, onbiopsies there was focal intestinal metaplasia. CT scan showed pyloric thickening but no other abnormalities, no signs of obvious neoplasm.  Repeat EGD July 2008, repeat biopsies of pyloric stricture or showed no malignancy. Dilated to 15 mm. Repeat EGD later in July 2008 showed the same, dilated to 16.5 mm. Repeat EGD August 2008 showed the same, dilated to 18 mm. He was recommended to followup 3-4 weeks later but that was the last I saw him.     HPI: This is a   very pleasant 78 year old man whom I last saw several years ago.  I was helping him with his benign, pyloric stenosis, see the EGD reports above.  I last saw him 4-5 years ago.  He has been eating OK.  No vomiting, no nausea.  He eats small meals.  Early satiety but he works around it.  His weight has been stable.  No overt GI bleeding.  His bowels are not a problems.  He was recently found to have hemoglobin of 11.4, 2 years ago it was 13. His iron level was slightly low at 34.  He does not take much nsaids.    He does have minor dysphagia, pills especially.    Has never had colonoscopy that he can recall. No FH of colon cancer, polyps.   Review of systems: Pertinent positive and negative review of systems were noted in the above HPI section. Complete review of systems was performed and was otherwise normal.    Past Medical History  Diagnosis Date  . Positive PPD 1991    no INH prophylaxis due to age  . Hypertension   . Hyperlipidemia   . BPH (benign prostatic hyperplasia)   . PVD (peripheral vascular disease)   . Renal insufficiency   . Other abnormal glucose 2008    A1c 6.1%  . Fasting hyperglycemia   . Pyloric stenosis     Past  Surgical History  Procedure Date  . Cholecystectomy   . Liver biopsy   . Upper gastrointestinal endoscopy 02/2007    Dr. Oretha Caprice; gastritis,incomplete pyloric stricture with partial outlet obstruction    Current Outpatient Prescriptions  Medication Sig Dispense Refill  . amLODipine (NORVASC) 5 MG tablet Take 1 tablet (5 mg total) by mouth daily.  90 tablet  3  . aspirin 81 MG tablet Take 81 mg by mouth daily.        . furosemide (LASIX) 40 MG tablet Take 1 tablet (40 mg total) by mouth daily.  90 tablet  1  . gabapentin (NEURONTIN) 100 MG capsule 1 every 8 hrs prn  30 capsule  2  . olmesartan (BENICAR) 40 MG tablet Take 1 tablet (40 mg total) by mouth daily.  30 tablet  5    Allergies as of 01/13/2012  . (No Known Allergies)    Family History  Problem Relation Age of Onset  . Stroke Father   . Hypertension Mother   . Heart attack Mother   . Cancer Maternal Aunt     ? primary  . Alcohol abuse Brother     History   Social History  . Marital Status: Divorced    Spouse Name: N/A    Number of Children: N/A  . Years of Education: N/A   Occupational History  .  Retired    Social History Main Topics  . Smoking status: Former Smoker    Quit date: 11/11/1975  . Smokeless tobacco: Never Used  . Alcohol Use: No  . Drug Use: No  . Sexually Active: Not on file   Other Topics Concern  . Not on file   Social History Narrative   [employer]       Physical Exam: BP 138/66  Pulse 80  Ht 5\' 9"  (1.753 m)  Wt 145 lb 6.4 oz (65.953 kg)  BMI 21.47 kg/m2 Constitutional: generally well-appearing Psychiatric: alert and oriented x3 Eyes: extraocular movements intact Mouth: oral pharynx moist, no lesions Neck: supple no lymphadenopathy Cardiovascular: heart regular rate and rhythm Lungs: clear to auscultation bilaterally Abdomen: soft, nontender, nondistended, no obvious ascites, no peritoneal signs, normal bowel sounds Extremities: no lower extremity  edema bilaterally Skin: no lesions on visible extremities    Assessment and plan: 78 y.o. male with  mild iron deficiency anemia, otherwise routine risk for colon cancer, previous pyloric stenosis  He does not believe that he has ever had colon cancer screening with a colonoscopy. Given his mild iron deficiency anemia we will proceed with that now. I am also going to look in his stomach again since he has had pyloric stenosis. It sounds like he has modified his diet and works around that narrow pylorus quite well. The area has been biopsied 2 or 3 times several years ago but given his anemia I would like to look at it again and repeat biopsies to check for neoplastic transformation.

## 2012-01-14 ENCOUNTER — Telehealth: Payer: Self-pay

## 2012-01-14 NOTE — Telephone Encounter (Signed)
Paperwork was received from [employer]. Benicar is no longer covered. This drug will only be covered if patient tries other drugs first. This is referred to as a step therapy program. Per Dr.Hopper (hand written on paperwork) change Benicar to Losartan 100 mg #90/1 refill    I called and left message on voicemail for patient to return call to discuss

## 2012-01-21 MED ORDER — LOSARTAN POTASSIUM 100 MG PO TABS: 100.0000 mg | ORAL_TABLET | Freq: Every day | ORAL | Status: DC

## 2012-01-21 NOTE — Telephone Encounter (Signed)
Left message on voicemail informing patient to return call when available

## 2012-01-21 NOTE — Telephone Encounter (Signed)
Spoke with patient, patient ok'd med change and would like rx sent to [facility] [address]

## 2012-02-18 ENCOUNTER — Other Ambulatory Visit (INDEPENDENT_AMBULATORY_CARE_PROVIDER_SITE_OTHER): Payer: Medicare Other

## 2012-02-18 DIAGNOSIS — I10 Essential (primary) hypertension: Secondary | ICD-10-CM | POA: Diagnosis not present

## 2012-02-18 LAB — BASIC METABOLIC PANEL
BUN: 34 mg/dL — ABNORMAL HIGH (ref 6–23)
Calcium: 9.2 mg/dL (ref 8.4–10.5)
Creatinine, Ser: 2.4 mg/dL — ABNORMAL HIGH (ref 0.4–1.5)
GFR: 32.94 mL/min — ABNORMAL LOW (ref >60.00)

## 2012-02-24 ENCOUNTER — Encounter: Payer: Self-pay | Admitting: Gastroenterology

## 2012-02-24 ENCOUNTER — Ambulatory Visit (AMBULATORY_SURGERY_CENTER): Payer: Medicare Other | Admitting: Gastroenterology

## 2012-02-24 VITALS — BP 133/93 | HR 81 | Temp 98.1°F | Resp 20 | Ht 69.0 in | Wt 145.0 lb

## 2012-02-24 DIAGNOSIS — D126 Benign neoplasm of colon, unspecified: Secondary | ICD-10-CM

## 2012-02-24 DIAGNOSIS — D509 Iron deficiency anemia, unspecified: Secondary | ICD-10-CM | POA: Diagnosis not present

## 2012-02-24 DIAGNOSIS — D1330 Benign neoplasm of unspecified part of small intestine: Secondary | ICD-10-CM | POA: Diagnosis not present

## 2012-02-24 MED ORDER — SODIUM CHLORIDE 0.9 % IV SOLN: 500.0000 mL | INTRAVENOUS | Status: DC

## 2012-02-24 NOTE — Patient Instructions (Signed)
YOU HAD AN ENDOSCOPIC PROCEDURE TODAY AT THE [facility]: Refer to the procedure report that was given to you for any specific questions about what was found during the examination.  If the procedure report does not answer your questions, please call your gastroenterologist to clarify.  If you requested that your care partner not be given the details of your procedure findings, then the procedure report has been included in a sealed envelope for you to review at your convenience later.  YOU SHOULD EXPECT: Some feelings of bloating in the abdomen. Passage of more gas than usual.  Walking can help get rid of the air that was put into your GI tract during the procedure and reduce the bloating. If you had a lower endoscopy (such as a colonoscopy or flexible sigmoidoscopy) you may notice spotting of blood in your stool or on the toilet paper. If you underwent a bowel prep for your procedure, then you may not have a normal bowel movement for a few days.  DIET: Your first meal following the procedure should be a light meal and then it is ok to progress to your normal diet.  A half-sandwich or bowl of soup is an example of a good first meal.  Heavy or fried foods are harder to digest and may make you feel nauseous or bloated.  Likewise meals heavy in dairy and vegetables can cause extra gas to form and this can also increase the bloating.  Drink plenty of fluids but you should avoid alcoholic beverages for 24 hours.  ACTIVITY: Your care partner should take you home directly after the procedure.  You should plan to take it easy, moving slowly for the rest of the day.  You can resume normal activity the day after the procedure however you should NOT DRIVE or use heavy machinery for 24 hours (because of the sedation medicines used during the test).    SYMPTOMS TO REPORT IMMEDIATELY: A gastroenterologist can be reached at any hour.  During normal business hours, 8:30 AM to 5:00 PM Monday through Friday,  call [phone].  After hours and on weekends, please call the GI answering service at [phone] who will take a message and have the physician on call contact you.   Following lower endoscopy (colonoscopy or flexible sigmoidoscopy):  Excessive amounts of blood in the stool  Significant tenderness or worsening of abdominal pains  Swelling of the abdomen that is new, acute  Fever of 100F or higher  Following upper endoscopy (EGD)  Vomiting of blood or coffee ground material  New chest pain or pain under the shoulder blades  Painful or persistently difficult swallowing  New shortness of breath  Fever of 100F or higher  Black, tarry-looking stools  FOLLOW UP: If any biopsies were taken you will be contacted by phone or by letter within the next 1-3 weeks.  Call your gastroenterologist if you have not heard about the biopsies in 3 weeks.  Our staff will call the home number listed on your records the next business day following your procedure to check on you and address any questions or concerns that you may have at that time regarding the information given to you following your procedure. This is a courtesy call and so if there is no answer at the home number and we have not heard from you through the emergency physician on call, we will assume that you have returned to your regular daily activities without incident.  SIGNATURES/CONFIDENTIALITY: You and/or your care   partner have signed paperwork which will be entered into your electronic medical record.  These signatures attest to the fact that that the information above on your After Visit Summary has been reviewed and is understood.  Full responsibility of the confidentiality of this discharge information lies with you and/or your care-partner.   Colonoscopy- we removed 1 polyps and will send to pathology  Endoscopy- polyp noted and biopsy taken of this.  Dr. Ardis Hughs will contact you with the final results and any recommendation

## 2012-02-24 NOTE — Progress Notes (Signed)
Patient did not experience any of the following events: a burn prior to discharge; a fall within the facility; wrong site/side/patient/procedure/implant event; or a hospital transfer or hospital admission upon discharge from the facility. (G8907) Patient did not have preoperative order for IV antibiotic SSI prophylaxis. (G8918)  

## 2012-02-24 NOTE — Op Note (Signed)
[facility] [place], [place]  [id number]  COLONOSCOPY PROCEDURE REPORT  PATIENT:  Ryan Cortez, Ryan Cortez  MR#:  [id number] BIRTHDATE:  24-Jan-1927, 85 yrs. old  GENDER:  male ENDOSCOPIST:  Milus Banister, MD PROCEDURE DATE:  02/24/2012 PROCEDURE:  Colonoscopy with biopsy ASA CLASS:  Class II INDICATIONS:  IDA MEDICATIONS:   Fentanyl 50 mcg IV, These medications were titrated to patient response per physician's verbal order, Versed 4 mg IV  DESCRIPTION OF PROCEDURE:   After the risks benefits and alternatives of the procedure were thoroughly explained, informed consent was obtained.  Digital rectal exam was performed and revealed no rectal masses.   The LB [id number]-[id number] endoscope was introduced through the anus and advanced to the cecum, which was identified by both the appendix and ileocecal valve, without limitations.  The quality of the prep was good..  The instrument was then slowly withdrawn as the colon was fully examined. <<PROCEDUREIMAGES>> FINDINGS:  A diminutive polyp was found in the ascending colon. This was removed with forceps and sent to pathology (jar 1) (see image3).  This was otherwise a normal examination of the colon (see image1, image2, and image4).   Retroflexed views in the rectum revealed no abnormalities. COMPLICATIONS:  None  ENDOSCOPIC IMPRESSION: 1) Diminutive polyp in the ascending colon, removed and sent to pathology 2) Otherwise normal examination RECOMMENDATIONS: 1) Given your age, you will not need another colonoscopy for colon cancer screening or polyp surveillance. These types of tests usually stop around the age 64. 2) You will receive a letter within 1-2 weeks with the results of your biopsy as well as final recommendations. Please call my office if you have not received a letter after 3 weeks. 3) EGD now to continue workup of IDA  ______________________________ Milus Banister, MD  n. eSIGNED:   Milus Banister  at 02/24/2012 10:49 AM  Jacinto Halim, [id number]

## 2012-02-24 NOTE — Op Note (Signed)
[facility]. [place], [place]  [id number]  ENDOSCOPY PROCEDURE REPORT  PATIENT:  Ryan, Cortez  MR#:  [id number] BIRTHDATE:  Mar 30, 1927, 20 yrs. old  GENDER:  male ENDOSCOPIST:  Milus Banister, MD PROCEDURE DATE:  02/24/2012 PROCEDURE:  EGD with biopsy, [id number] ASA CLASS:  Class II INDICATIONS:  IDA MEDICATIONS:  There was residual sedation effect present from prior procedure., Versed 2 mg IV, These medications were titrated to patient response per physician's verbal order TOPICAL ANESTHETIC:  Cetacaine Spray  DESCRIPTION OF PROCEDURE:   After the risks benefits and alternatives of the procedure were thoroughly explained, informed consent was obtained.  The [facility] GIF-H180 [id number] endoscope was introduced through the mouth and advanced to the second portion of the duodenum, without limitations.  The instrument was slowly withdrawn as the mucosa was fully examined. <<PROCEDUREIMAGES>> A pedunculated polyp was found. This was a 2cm, friable, hyperplastic/inflamatory appearing polyp in body of stomach on a thin stalk. This was biopsied and sent to pathology (jar 1) (see image3 and image5).  pyloric stenosis. Mild pyloric stenosis (chronic) that was able to be passed with adult gatroscope without resistence.  Otherwise the examination was normal (see image2 and image1).  ENDOSCOPIC IMPRESSION: 1) Pedunculated polyp in stomach, biopsied 2) Pyloric stenosis (chronic) 3) Otherwise normal examination  RECOMMENDATIONS: Await final biopsies.  If this is an inflamatory polyp, will start carafate twice daily.  This polyp was friable and certainly could cause your mild anemia.  ______________________________ Milus Banister, MD  n. eSIGNED:   Milus Banister at 02/24/2012 11:06 AM  Jacinto Halim, [id number]

## 2012-02-25 ENCOUNTER — Telehealth: Payer: Self-pay | Admitting: *Deleted

## 2012-02-25 NOTE — Telephone Encounter (Signed)
  Follow up Call-  Call back number 02/24/2012  Post procedure Call Back phone  # [phone]  Permission to leave phone message Yes     Patient questions:  Do you have a fever, pain , or abdominal swelling? no Pain Score  0 *  Have you tolerated food without any problems? yes  Have you been able to return to your normal activities? yes  Do you have any questions about your discharge instructions: Diet   no Medications  no Follow up visit  no  Do you have questions or concerns about your Care? no  Actions: * If pain score is 4 or above: No action needed, pain <4.

## 2012-03-04 ENCOUNTER — Telehealth: Payer: Self-pay

## 2012-03-04 DIAGNOSIS — D509 Iron deficiency anemia, unspecified: Secondary | ICD-10-CM

## 2012-03-04 MED ORDER — SUCRALFATE 1 GM/10ML PO SUSP: 1.0000 g | Freq: Two times a day (BID) | ORAL | Status: DC

## 2012-03-04 NOTE — Telephone Encounter (Signed)
Pt has been given his path results and advised to have labs in 2 months and pick up his rx.  Pt wil call with any questions or concerns

## 2012-03-04 NOTE — Telephone Encounter (Signed)
Left message on machine to call back  

## 2012-03-23 ENCOUNTER — Encounter: Payer: Self-pay | Admitting: Internal Medicine

## 2012-03-23 ENCOUNTER — Ambulatory Visit (INDEPENDENT_AMBULATORY_CARE_PROVIDER_SITE_OTHER): Payer: Medicare Other | Admitting: Internal Medicine

## 2012-03-23 VITALS — BP 150/74 | HR 65 | Temp 98.1°F | Wt 140.0 lb

## 2012-03-23 DIAGNOSIS — I10 Essential (primary) hypertension: Secondary | ICD-10-CM

## 2012-03-23 DIAGNOSIS — N189 Chronic kidney disease, unspecified: Secondary | ICD-10-CM | POA: Diagnosis not present

## 2012-03-23 DIAGNOSIS — R7309 Other abnormal glucose: Secondary | ICD-10-CM

## 2012-03-23 LAB — BASIC METABOLIC PANEL
BUN: 38 mg/dL — ABNORMAL HIGH (ref 6–23)
Creatinine, Ser: 2.2 mg/dL — ABNORMAL HIGH (ref 0.4–1.5)
GFR: 36.18 mL/min — ABNORMAL LOW (ref >60.00)
Glucose, Bld: 96 mg/dL (ref 70–99)

## 2012-03-23 LAB — HEMOGLOBIN A1C: Hgb A1c MFr Bld: 6.1 % (ref 4.6–6.5)

## 2012-03-23 MED ORDER — AMLODIPINE BESYLATE 5 MG PO TABS: 5.0000 mg | ORAL_TABLET | Freq: Every day | ORAL | Status: DC

## 2012-03-23 NOTE — Assessment & Plan Note (Addendum)
Last A1c 6.2, get labs. Meaning of the hemoglobin A1c discussed with the patient.

## 2012-03-23 NOTE — Progress Notes (Signed)
  Subjective:    Patient ID: Ryan Cortez, male    DOB: [birth year], 78 y.o.   MRN: [id number]  HPI  Acute visit Since Benicar was changed to losartan his BP has increased, it used to be in the 140s but now is in the 160s. Yesterday he got concerned because he felt dizzy for 15 minutes, dizziness was mild, there was no associated symptoms, it resolved by itself. Dizziness has not returned. Chronic renal insufficiency, chart is reviewed, last creatinine slightly elevated.  Past Medical History  Diagnosis Date  . Positive PPD 1991    no INH prophylaxis due to age  . Hypertension   . Hyperlipidemia   . BPH (benign prostatic hyperplasia)   . PVD (peripheral vascular disease)   . Renal insufficiency   . Other abnormal glucose 2008    A1c 6.1%  . Fasting hyperglycemia   . Pyloric stenosis   . Anemia    Past Surgical History  Procedure Date  . Cholecystectomy   . Liver biopsy   . Upper gastrointestinal endoscopy 02/2007    Dr. Oretha Caprice; gastritis,incomplete pyloric stricture with partial outlet obstruction      Review of Systems Denies chest pain or shortness of breath No slurred speech, diplopia or headaches No syncope or presyncopal feeling.     Objective:   Physical Exam  General -- alert, well-developed, and well-nourished.   Neck --no thyromegaly , normal carotid pulse Lungs -- normal respiratory effort, no intercostal retractions, no accessory muscle use, and normal breath sounds.   Heart-- normal rate, regular rhythm, no murmur, and no gallop.   Extremities-- no pretibial edema bilaterally Neurologic-- alert & oriented X3, speech, gait and motor are intact. EOMI Psych-- Cognition and judgment appear intact. Alert and cooperative with normal attention span and concentration.  not anxious appearing and not depressed appearing.       Assessment & Plan:

## 2012-03-23 NOTE — Assessment & Plan Note (Signed)
Will check a renal ultrasound, refer to nephrology

## 2012-03-23 NOTE — Assessment & Plan Note (Addendum)
History of hypertension and chronic renal insufficiency. BP lately has been elevated in the 160s, on further questioning, he stopped taking amlodipine, unclear why. He denies side effects. Also had dizziness for 15 minutes yesterday, no associated symptoms, neurological exam normal. Plan:  Restart amlodipine, BMP Will call if dizziness re surface or go to the ER if severe

## 2012-03-23 NOTE — Patient Instructions (Signed)
Continue with all her regular medicines and also amlodipine, see list below. Come back in 2 weeks Will schedule a renal ultrasound and also send you to the kidney doctor.

## 2012-03-26 ENCOUNTER — Ambulatory Visit
Admission: RE | Admit: 2012-03-26 | Discharge: 2012-03-26 | Disposition: A | Discharge status: Home / Self Care | Payer: Medicare Other | Source: Ambulatory Visit | Attending: Internal Medicine | Admitting: Internal Medicine

## 2012-03-26 DIAGNOSIS — N189 Chronic kidney disease, unspecified: Secondary | ICD-10-CM

## 2012-03-26 DIAGNOSIS — N281 Cyst of kidney, acquired: Secondary | ICD-10-CM | POA: Diagnosis not present

## 2012-03-26 DIAGNOSIS — M545 Low back pain, unspecified: Secondary | ICD-10-CM | POA: Diagnosis not present

## 2012-05-04 ENCOUNTER — Telehealth: Payer: Self-pay

## 2012-05-04 NOTE — Telephone Encounter (Signed)
Pt has been notified to have labs  

## 2012-05-04 NOTE — Telephone Encounter (Signed)
Message copied by Barron Alvine on Tue May 04, 2012  8:17 AM ------      Message from: Barron Alvine      Created: Thu Mar 04, 2012 10:09 AM       Pt needs CBC see path report 03/04/12

## 2012-05-06 ENCOUNTER — Other Ambulatory Visit (INDEPENDENT_AMBULATORY_CARE_PROVIDER_SITE_OTHER): Payer: Medicare Other

## 2012-05-06 DIAGNOSIS — D509 Iron deficiency anemia, unspecified: Secondary | ICD-10-CM | POA: Diagnosis not present

## 2012-05-06 LAB — CBC WITH DIFFERENTIAL/PLATELET
Basophils Absolute: 0 10*3/uL (ref 0.0–0.1)
Eosinophils Absolute: 0.2 10*3/uL (ref 0.0–0.7)
Lymphocytes Relative: 26.8 % (ref 12.0–46.0)
Lymphs Abs: 2.7 10*3/uL (ref 0.7–4.0)
MCHC: 32.1 g/dL (ref 30.0–36.0)
Monocytes Relative: 8.9 % (ref 3.0–12.0)
Neutro Abs: 6.2 10*3/uL (ref 1.4–7.7)
Platelets: 210 10*3/uL (ref 150.0–400.0)
RDW: 14.2 % (ref 11.5–14.6)

## 2012-05-10 DIAGNOSIS — D649 Anemia, unspecified: Secondary | ICD-10-CM | POA: Diagnosis not present

## 2012-05-10 DIAGNOSIS — N183 Chronic kidney disease, stage 3 unspecified: Secondary | ICD-10-CM | POA: Diagnosis not present

## 2012-05-10 DIAGNOSIS — N2581 Secondary hyperparathyroidism of renal origin: Secondary | ICD-10-CM | POA: Diagnosis not present

## 2012-05-10 DIAGNOSIS — I10 Essential (primary) hypertension: Secondary | ICD-10-CM | POA: Diagnosis not present

## 2012-05-10 DIAGNOSIS — I129 Hypertensive chronic kidney disease with stage 1 through stage 4 chronic kidney disease, or unspecified chronic kidney disease: Secondary | ICD-10-CM | POA: Diagnosis not present

## 2012-05-12 ENCOUNTER — Encounter: Payer: Self-pay | Admitting: Internal Medicine

## 2012-05-12 ENCOUNTER — Ambulatory Visit (INDEPENDENT_AMBULATORY_CARE_PROVIDER_SITE_OTHER): Payer: Medicare Other | Admitting: Internal Medicine

## 2012-05-12 VITALS — BP 146/70 | HR 96 | Temp 98.4°F | Wt 137.8 lb

## 2012-05-12 DIAGNOSIS — Q400 Congenital hypertrophic pyloric stenosis: Secondary | ICD-10-CM

## 2012-05-12 DIAGNOSIS — R7309 Other abnormal glucose: Secondary | ICD-10-CM

## 2012-05-12 DIAGNOSIS — D649 Anemia, unspecified: Secondary | ICD-10-CM

## 2012-05-12 DIAGNOSIS — I10 Essential (primary) hypertension: Secondary | ICD-10-CM

## 2012-05-12 DIAGNOSIS — K635 Polyp of colon: Secondary | ICD-10-CM | POA: Insufficient documentation

## 2012-05-12 DIAGNOSIS — Z87898 Personal history of other specified conditions: Secondary | ICD-10-CM

## 2012-05-12 DIAGNOSIS — N189 Chronic kidney disease, unspecified: Secondary | ICD-10-CM

## 2012-05-12 MED ORDER — LOSARTAN POTASSIUM 100 MG PO TABS: 100.0000 mg | ORAL_TABLET | Freq: Every day | ORAL | Status: DC

## 2012-05-12 NOTE — Patient Instructions (Addendum)
Blood Pressure Goal is AVERAGE < 140/90; Ideally an AVERAGE < 135/85. This AVERAGE should be calculated from @ least 5-7 BP readings taken @ different times of day on different days of week. You should not respond to isolated BP readings , but rather the AVERAGE for that week

## 2012-05-12 NOTE — Progress Notes (Signed)
  Subjective:    Patient ID: Ryan Cortez, male    DOB: 1927-04-25, 78 y.o.   MRN: [id number]  HPI CHRONIC HYPERTENSION  Disease Monitoring  Blood pressure range: not monitored  Chest pain: no  Dyspnea:no  Claudication: sometimes  Medication compliance: yes  Medication Side Effects  Lightheadedness: no  Urinary frequency: with diuretic   Edema: intermittently   Preventitive Healthcare:  Exercise: still working   Diet Pattern: no plan  Salt Restriction: no     Review of Systems  evaluation by Dr. Ardis Hughs was reviewed. His chart was updated in reference to problem list  & assessment In all is we need to worry about renal artery stenosis    Objective:   Physical Exam He is thin but appears healthy and well-nourished; he is in no acute distress  No carotid bruits are present.  Heart rhythm and rate are normal with no gallops.Grade 1/2 over 6 systolic murmur   Chest is clear with no increased work of breathing  There is no evidence of aortic aneurysm or renal artery bruits  He has no clubbing or edema.   Pedal pulses are intact   No ischemic skin changes are present         Assessment & Plan:  #1 hypertension; systolic is mildly elevated. Home monitor her goals discussed. He was encouraged to buy a blood pressure cuff to facilitate the monitor.  #2 see updated problem list with assessments.

## 2012-05-12 NOTE — Addendum Note (Signed)
Addended byUnice Cobble F on: 05/12/2012 03:07 PM   Modules accepted: Orders

## 2012-06-21 ENCOUNTER — Other Ambulatory Visit: Payer: Self-pay

## 2012-06-21 MED ORDER — FUROSEMIDE 40 MG PO TABS: 40.0000 mg | ORAL_TABLET | Freq: Every day | ORAL | Status: DC

## 2012-06-21 NOTE — Telephone Encounter (Signed)
Patient called in requesting refill, patient was informed that in the future he needs to contact pharmacy first. RX sent to pharmacy

## 2012-08-10 DIAGNOSIS — N183 Chronic kidney disease, stage 3 unspecified: Secondary | ICD-10-CM | POA: Diagnosis not present

## 2012-08-10 DIAGNOSIS — I129 Hypertensive chronic kidney disease with stage 1 through stage 4 chronic kidney disease, or unspecified chronic kidney disease: Secondary | ICD-10-CM | POA: Diagnosis not present

## 2012-08-10 DIAGNOSIS — D649 Anemia, unspecified: Secondary | ICD-10-CM | POA: Diagnosis not present

## 2012-08-10 DIAGNOSIS — I10 Essential (primary) hypertension: Secondary | ICD-10-CM | POA: Diagnosis not present

## 2012-08-10 DIAGNOSIS — N2581 Secondary hyperparathyroidism of renal origin: Secondary | ICD-10-CM | POA: Diagnosis not present

## 2012-09-13 ENCOUNTER — Telehealth: Payer: Self-pay

## 2012-09-13 DIAGNOSIS — D509 Iron deficiency anemia, unspecified: Secondary | ICD-10-CM

## 2012-09-13 NOTE — Telephone Encounter (Signed)
Pt has been notified to make an appointment with Dr Ardis Hughs and have labs

## 2012-09-13 NOTE — Telephone Encounter (Signed)
Message copied by Barron Alvine on Mon Sep 13, 2012  9:17 AM ------      Message from: Barron Alvine      Created: Wed May 12, 2012 12:58 PM        repeat CBC in 4 months and rov afterwards

## 2012-09-23 ENCOUNTER — Other Ambulatory Visit: Payer: Medicare Other

## 2012-09-23 ENCOUNTER — Other Ambulatory Visit (INDEPENDENT_AMBULATORY_CARE_PROVIDER_SITE_OTHER): Payer: BC Managed Care – PPO

## 2012-09-23 DIAGNOSIS — D509 Iron deficiency anemia, unspecified: Secondary | ICD-10-CM | POA: Diagnosis not present

## 2012-09-23 LAB — CBC WITH DIFFERENTIAL/PLATELET
Basophils Absolute: 0 10*3/uL (ref 0.0–0.1)
Eosinophils Absolute: 0.2 10*3/uL (ref 0.0–0.7)
HCT: 35.2 % — ABNORMAL LOW (ref 39.0–52.0)
Hemoglobin: 11.4 g/dL — ABNORMAL LOW (ref 13.0–17.0)
Lymphs Abs: 2.2 10*3/uL (ref 0.7–4.0)
MCHC: 32.3 g/dL (ref 30.0–36.0)
MCV: 87.8 fl (ref 78.0–100.0)
Monocytes Absolute: 0.8 10*3/uL (ref 0.1–1.0)
Monocytes Relative: 9.3 % (ref 3.0–12.0)
Neutro Abs: 5.2 10*3/uL (ref 1.4–7.7)
Platelets: 212 10*3/uL (ref 150.0–400.0)
RDW: 14 % (ref 11.5–14.6)

## 2012-10-20 ENCOUNTER — Ambulatory Visit (INDEPENDENT_AMBULATORY_CARE_PROVIDER_SITE_OTHER): Payer: Medicare Other | Admitting: Gastroenterology

## 2012-10-20 ENCOUNTER — Encounter: Payer: Self-pay | Admitting: Gastroenterology

## 2012-10-20 VITALS — BP 132/58 | HR 76 | Ht 66.75 in | Wt 141.0 lb

## 2012-10-20 DIAGNOSIS — D649 Anemia, unspecified: Secondary | ICD-10-CM

## 2012-10-20 NOTE — Progress Notes (Signed)
Review of pertinent gastrointestinal problems:  1. Pyloric stenosis ; likely benign presented with early satiety and weight loss April 2008. EGD found gastritis, incomplete pyloric stricture causing partial gastric outlet obstruction, CLO biopsies negative. No neoplasm or dysplasia, onbiopsies there was focal intestinal metaplasia. CT scan showed pyloric thickening but no other abnormalities, no signs of obvious neoplasm. Repeat EGD July 2008, repeat biopsies of pyloric stricture or showed no malignancy. Dilated to 15 mm. Repeat EGD later in July 2008 showed the same, dilated to 16.5 mm. Repeat EGD August 2008 showed the same, dilated to 18 mm. He was recommended to followup 3-4 weeks later but that was the last I saw him. 2. IDA 2013: possibly from hyperplastic, inflammatory polyp in stomach seen and biopsied EGD 01/2012 (2cm pedunculated polpy) was put on bid carafate;  Hb stable 11.4 throughout 2013 2. Adenomatous colon polyp: colonoscopy 01/2012 single small TA removed, no recall due to age (was 13)  HPI: This is a  very pleasant 78 year old man whom I last saw several months ago  We've been watching his blood counts and been absolutely stable over the past several months at 11.4.  He has been feeling pretty good.  Has lost a bit of weight. 4 pounds in 9 months according to our scale here.  He has had no abdominal pains, no overt bleeding signs.   Past Medical History  Diagnosis Date  . Positive PPD 1991    no INH prophylaxis due to age  . Hypertension   . Hyperlipidemia   . BPH (benign prostatic hyperplasia)   . PVD (peripheral vascular disease)   . Renal insufficiency   . Other abnormal glucose 2008    A1c 6.1%  . Fasting hyperglycemia   . Pyloric stenosis   . Anemia     Past Surgical History  Procedure Date  . Cholecystectomy   . Liver biopsy   . Upper gastrointestinal endoscopy 02/2007    Dr. Oretha Caprice; gastritis,incomplete pyloric stricture with partial outlet obstruction     Current Outpatient Prescriptions  Medication Sig Dispense Refill  . amLODipine (NORVASC) 5 MG tablet Take 1 tablet (5 mg total) by mouth daily.  30 tablet  6  . aspirin 81 MG tablet Take 81 mg by mouth daily.        . furosemide (LASIX) 40 MG tablet Take 1 tablet (40 mg total) by mouth daily.  90 tablet  1  . losartan (COZAAR) 100 MG tablet Take 1 tablet (100 mg total) by mouth daily.  90 tablet  3  . sucralfate (CARAFATE) 1 GM/10ML suspension Take 10 mLs (1 g total) by mouth 2 (two) times daily.  420 mL  3  . [DISCONTINUED] olmesartan (BENICAR) 40 MG tablet Take 1 tablet (40 mg total) by mouth daily.  30 tablet  5   Current Facility-Administered Medications  Medication Dose Route Frequency Provider Last Rate Last Dose  . 0.9 %  sodium chloride infusion  500 mL Intravenous Continuous Milus Banister, MD        Allergies as of 10/20/2012  . (No Known Allergies)    Family History  Problem Relation Age of Onset  . Stroke Father   . Hypertension Mother   . Heart attack Mother   . Cancer Maternal Aunt     ? primary  . Alcohol abuse Brother     History   Social History  . Marital Status: Divorced    Spouse Name: N/A    Number of Children: N/A  .  Years of Education: N/A   Occupational History  . Retired    Social History Main Topics  . Smoking status: Former Smoker    Quit date: 11/11/1975  . Smokeless tobacco: Never Used  . Alcohol Use: No  . Drug Use: No  . Sexually Active: Not on file   Other Topics Concern  . Not on file   Social History Narrative   [employer]      Physical Exam: BP 132/58  Pulse 76  Ht 5' 6.75" (1.695 m)  Wt 141 lb (63.957 kg)  BMI 22.25 kg/m2 Constitutional: generally well-appearing Psychiatric: alert and oriented x3 Abdomen: soft, nontender, nondistended, no obvious ascites, no peritoneal signs, normal bowel sounds     Assessment and plan: 78 y.o. male with iron deficiency anemia; possibly from benign,  hyperplastic gastric polyp  He will start iron supplement pill once daily, over-the-counter. Repeat CBC in 3 months with iron studies as well.

## 2012-10-20 NOTE — Patient Instructions (Addendum)
Start taking one iron supplement pill every day.  325 mg ferrous sulfate. Repeat CBC, iron, ferritin, TIBC level in 3 months.

## 2013-01-17 ENCOUNTER — Encounter (INDEPENDENT_AMBULATORY_CARE_PROVIDER_SITE_OTHER): Payer: Medicare Other

## 2013-01-17 DIAGNOSIS — I6529 Occlusion and stenosis of unspecified carotid artery: Secondary | ICD-10-CM | POA: Diagnosis not present

## 2013-01-24 ENCOUNTER — Encounter: Payer: Self-pay | Admitting: *Deleted

## 2013-02-13 ENCOUNTER — Other Ambulatory Visit: Payer: Self-pay | Admitting: Internal Medicine

## 2013-04-01 ENCOUNTER — Ambulatory Visit (INDEPENDENT_AMBULATORY_CARE_PROVIDER_SITE_OTHER): Payer: Medicare Other | Admitting: Internal Medicine

## 2013-04-01 ENCOUNTER — Encounter: Payer: Self-pay | Admitting: Internal Medicine

## 2013-04-01 VITALS — BP 138/75 | HR 75 | Temp 98.4°F | Resp 12 | Ht 67.5 in | Wt 138.0 lb

## 2013-04-01 DIAGNOSIS — I10 Essential (primary) hypertension: Secondary | ICD-10-CM | POA: Diagnosis not present

## 2013-04-01 DIAGNOSIS — D649 Anemia, unspecified: Secondary | ICD-10-CM

## 2013-04-01 DIAGNOSIS — I129 Hypertensive chronic kidney disease with stage 1 through stage 4 chronic kidney disease, or unspecified chronic kidney disease: Secondary | ICD-10-CM | POA: Diagnosis not present

## 2013-04-01 DIAGNOSIS — Z0000 Encounter for general adult medical examination without abnormal findings: Secondary | ICD-10-CM

## 2013-04-01 DIAGNOSIS — E785 Hyperlipidemia, unspecified: Secondary | ICD-10-CM

## 2013-04-01 DIAGNOSIS — N189 Chronic kidney disease, unspecified: Secondary | ICD-10-CM

## 2013-04-01 DIAGNOSIS — R7309 Other abnormal glucose: Secondary | ICD-10-CM

## 2013-04-01 LAB — CBC WITH DIFFERENTIAL/PLATELET
Basophils Absolute: 0 10*3/uL (ref 0.0–0.1)
Basophils Relative: 1 % (ref 0–1)
MCHC: 32.6 g/dL (ref 30.0–36.0)
Neutro Abs: 4.5 10*3/uL (ref 1.7–7.7)
Neutrophils Relative %: 51 % (ref 43–77)
Platelets: 235 10*3/uL (ref 150–400)
RDW: 14.8 % (ref 11.5–15.5)

## 2013-04-01 LAB — HEPATIC FUNCTION PANEL
AST: 29 U/L (ref 0–37)
Bilirubin, Direct: 0.1 mg/dL (ref 0.0–0.3)
Total Bilirubin: 0.4 mg/dL (ref 0.3–1.2)

## 2013-04-01 LAB — TSH: TSH: 3.034 u[IU]/mL (ref 0.350–4.500)

## 2013-04-01 LAB — BASIC METABOLIC PANEL
BUN: 34 mg/dL — ABNORMAL HIGH (ref 6–23)
Potassium: 4.6 mEq/L (ref 3.5–5.3)
Sodium: 141 mEq/L (ref 135–145)

## 2013-04-01 LAB — LIPID PANEL
HDL: 68 mg/dL (ref >39)
Total CHOL/HDL Ratio: 2.9 Ratio

## 2013-04-01 NOTE — Patient Instructions (Addendum)
Share results with all non [facility] medical staff seen  

## 2013-04-01 NOTE — Progress Notes (Signed)
Subjective:    Patient ID: Ryan Cortez, male    DOB: [birth year], 79 y.o.   MRN: [id number]  HPI Medicare Wellness Visit:  Psychosocial & medical history were reviewed as required by Medicare (abuse,antisocial behavioral risks,firearm risk).  Social history: caffeine:cocoa every other day  , alcohol:no   ,  tobacco use: quit 1977   Exercise : walking 1/4 mpd 2-3 X/ week  No home & personal  safety / fall risk Activities of daily living: no limitations  Seatbelt  and smoke alarm employed. Power of Attorney/Living Will status : needed Ophthalmology exam pending Hearing evaluation not current Orientation :oriented X 3  Memory & recall :good Spelling  testing:good Mood & affect : normal . Depression / anxiety: denied Travel history : last [place] 1953 Immunization status :does not take Transfusion history:  none  Preventive health surveillance ( colonoscopy as per protocol/ SOC): current (aged out as per GI) Dental care:  Dentures. Chart reviewed &  Updated. Active issues reviewed & addressed.      Review of Systems    His blood pressure at home has ranged from 128/57-140/65. He denies chest pain, palpitations, dyspnea, paroxysmal nocturnal dyspnea, or claudication. He does have some cramps in his legs at times. He also has intermittent ankle edema.      Objective:   Physical Exam  Gen.: Healthy and well-nourished in appearance. Alert, appropriate and cooperative throughout exam.Appears much younger than stated age  Head: Normocephalic without obvious abnormalities; pattern alopecia  Eyes: No corneal or conjunctival inflammation noted. Extraocular motion intact. Vision grossly normal with lenses Ears: External  ear exam reveals no significant lesions or deformities. Wax on L. Hearing is grossly markedly decreased bilaterally. Nose: External nasal exam reveals no deformity or inflammation. Nasal mucosa are pink and moist. No lesions or exudates noted.  Mouth: Oral mucosa and  oropharynx reveal no lesions or exudates. Upper plate & lower partial. Neck: No deformities, masses, or tenderness noted. Range of motion &. Thyroid normal. Lungs: Normal respiratory effort; chest expands symmetrically. Lungs are clear to auscultation without rales, wheezes, or increased work of breathing. Heart: Normal rate and rhythm. Normal S1 and S2. No gallop, click, or rub. S4 w/o murmur. Abdomen: Bowel sounds normal; abdomen soft and nontender. No masses, organomegaly or hernias noted. Genitalia: As per Urology                                  Musculoskeletal/extremities: No deformity or scoliosis noted of  the thoracic or lumbar spine.  No clubbing, cyanosis, edema, or significant extremity  deformity noted. Range of motion normal .Tone & strength  Normal. Joints  reveal mild  flexion changes. Nail health good. Able to lie down & sit up w/o help. Negative SLR bilaterally Vascular: Carotid, radial artery, dorsalis pedis and  posterior tibial pulses are full and equal. No bruits present. Neurologic: Alert and oriented x3. Deep tendon reflexes symmetrical and normal.          Skin: Intact without suspicious lesions or rashes. Lymph: No cervical, axillary lymphadenopathy present. Psych: Mood and affect are normal. Normally interactive  Assessment & Plan:  #1 Medicare Wellness Exam; criteria met ; data entered #2 Problem List reviewed ; Assessment/ Recommendations made Plan: see Orders

## 2013-04-01 NOTE — Addendum Note (Signed)
Addended by: Modena Morrow D on: 04/01/2013 03:43 PM   Modules accepted: Orders

## 2013-04-03 ENCOUNTER — Other Ambulatory Visit: Payer: Self-pay | Admitting: Internal Medicine

## 2013-06-17 ENCOUNTER — Other Ambulatory Visit: Payer: Self-pay | Admitting: Internal Medicine

## 2013-08-29 DIAGNOSIS — N2581 Secondary hyperparathyroidism of renal origin: Secondary | ICD-10-CM | POA: Diagnosis not present

## 2013-08-29 DIAGNOSIS — N184 Chronic kidney disease, stage 4 (severe): Secondary | ICD-10-CM | POA: Diagnosis not present

## 2013-08-29 DIAGNOSIS — D649 Anemia, unspecified: Secondary | ICD-10-CM | POA: Diagnosis not present

## 2013-12-22 ENCOUNTER — Ambulatory Visit (INDEPENDENT_AMBULATORY_CARE_PROVIDER_SITE_OTHER): Payer: Medicare Other | Admitting: Internal Medicine

## 2013-12-22 ENCOUNTER — Encounter: Payer: Self-pay | Admitting: Internal Medicine

## 2013-12-22 VITALS — BP 132/68 | HR 77 | Temp 98.4°F | Resp 14 | Ht 67.5 in | Wt 143.0 lb

## 2013-12-22 DIAGNOSIS — N4889 Other specified disorders of penis: Secondary | ICD-10-CM | POA: Diagnosis not present

## 2013-12-22 DIAGNOSIS — N489 Disorder of penis, unspecified: Secondary | ICD-10-CM

## 2013-12-22 NOTE — Patient Instructions (Signed)
Use hypoallergenic cleansing motions.Use Aveeno Daily  Moisturizing Lotion  twice a day  for the dry skin. Bathe with moisturizing liquid soap , not bar soap.

## 2013-12-22 NOTE — Progress Notes (Signed)
   Subjective:    Patient ID: Ryan Cortez, male    DOB: Jul 11, 1927, 80 y.o.   MRN: [id number]  HPI  He has had a small lesion on his penis since September 2014. This gets irritated with bathing  But has not changed in size or color. There has been no associated discharge, bleeding or pain      Review of Systems He also denies fever, chills, sweats, or unexplained weight loss  He has no dysuria, pyuria, or hematuria.     Objective:   Physical Exam    He appears well-nourished are healthy. He appears much younger than stated age  He has no Jaundice or scleral icterus  He has no lymphadenopathy about the neck, axilla, or inguinal areas  He has no organomegaly or masses  There is 0.5 x 1 cm slightly shiny area over the dorsum of the penis proximal to the penile head. The tissue is not macerated or weeping. There is no suggestion of cellulitis. There is no ulcer formation.        Assessment & Plan:  #1penile neoplasm; ? Soap reaction See orders

## 2013-12-22 NOTE — Progress Notes (Signed)
Pre visit review using our clinic review tool, if applicable. No additional management support is needed unless otherwise documented below in the visit note/SLS  

## 2014-01-26 ENCOUNTER — Other Ambulatory Visit: Payer: Self-pay | Admitting: *Deleted

## 2014-01-26 MED ORDER — LOSARTAN POTASSIUM 100 MG PO TABS: ORAL_TABLET | ORAL | Status: DC

## 2014-01-26 MED ORDER — AMLODIPINE BESYLATE 5 MG PO TABS: ORAL_TABLET | ORAL | Status: DC

## 2014-02-01 DIAGNOSIS — N476 Balanoposthitis: Secondary | ICD-10-CM | POA: Diagnosis not present

## 2014-02-06 ENCOUNTER — Other Ambulatory Visit (HOSPITAL_COMMUNITY): Payer: Self-pay | Admitting: Cardiology

## 2014-02-06 DIAGNOSIS — I6529 Occlusion and stenosis of unspecified carotid artery: Secondary | ICD-10-CM

## 2014-02-13 ENCOUNTER — Encounter: Payer: Self-pay | Admitting: Internal Medicine

## 2014-02-13 ENCOUNTER — Ambulatory Visit (HOSPITAL_COMMUNITY): Payer: Medicare Other | Attending: Internal Medicine | Admitting: Cardiology

## 2014-02-13 DIAGNOSIS — I6529 Occlusion and stenosis of unspecified carotid artery: Secondary | ICD-10-CM | POA: Diagnosis not present

## 2014-02-13 NOTE — Progress Notes (Signed)
Carotid duplex performed 

## 2014-02-15 ENCOUNTER — Encounter: Payer: Self-pay | Admitting: *Deleted

## 2014-02-21 ENCOUNTER — Other Ambulatory Visit: Payer: Self-pay

## 2014-02-21 MED ORDER — LOSARTAN POTASSIUM 100 MG PO TABS: ORAL_TABLET | ORAL | Status: DC

## 2014-03-28 ENCOUNTER — Ambulatory Visit (INDEPENDENT_AMBULATORY_CARE_PROVIDER_SITE_OTHER): Payer: Medicare Other | Admitting: Internal Medicine

## 2014-03-28 ENCOUNTER — Encounter: Payer: Self-pay | Admitting: Internal Medicine

## 2014-03-28 VITALS — BP 148/70 | HR 76 | Temp 98.1°F | Wt 145.0 lb

## 2014-03-28 DIAGNOSIS — H6120 Impacted cerumen, unspecified ear: Secondary | ICD-10-CM

## 2014-03-28 DIAGNOSIS — H6122 Impacted cerumen, left ear: Secondary | ICD-10-CM

## 2014-03-28 DIAGNOSIS — J309 Allergic rhinitis, unspecified: Secondary | ICD-10-CM | POA: Diagnosis not present

## 2014-03-28 DIAGNOSIS — I6529 Occlusion and stenosis of unspecified carotid artery: Secondary | ICD-10-CM | POA: Diagnosis not present

## 2014-03-28 NOTE — Progress Notes (Signed)
Pre visit review using our clinic review tool, if applicable. No additional management support is needed unless otherwise documented below in the visit note. 

## 2014-03-28 NOTE — Patient Instructions (Signed)
Plain Mucinex (NOT D) for thick secretions ;force NON dairy fluids .   Nasal cleansing in the shower as discussed with lather of mild shampoo.After 10 seconds wash off lather while  exhaling through nostrils. Make sure that all residual soap is removed to prevent irritation.  Flonase OR Nasacort AQ 1 spray in each nostril twice a day as needed. Use the "crossover" technique into opposite nostril spraying toward opposite ear @ 45 degree angle, not straight up into nostril.  Use a Neti pot daily only  as needed for significant sinus congestion; going from open side to congested side . Plain Allegra (NOT D )  160 daily , Loratidine 10 mg , OR Zyrtec 10 mg @ bedtime  as needed for itchy eyes & sneezing.  Please do not use Q-tips as we discussed. Should wax build up occur, please put 2-3 drops of mineral oil in the affected  ear at night to soften the wax .Cover the canal with a  cotton ball to prevent the oil from staining bed linens. In the morning fill the ear canal with hydrogen peroxide & lie in the opposite lateral decubitus position(on the side opposite the affected ear)  for 10-15 minutes. After allowing this period of time for the peroxide to dissolve the wax ;shower and use the thinnest washrag available to wick out the wax. If both ears are involved ; alternate this treatment from ear to ear each night until no wax is found on the washrag. 

## 2014-03-28 NOTE — Progress Notes (Signed)
   Subjective:    Patient ID: Ryan Cortez, male    DOB: 01/01/1927, 80 y.o.   MRN: [id number]  HPI   Symptoms began one week ago as nasal congestion. He's had significant sinus congestion with clear drainage. Cough has been productive of clear sputum as well.  He's had some numbness or fullness in the left ear.  He feelstrigger may have been pollen exposure.  The ear symptoms began 5/17; these are not associated with otic discharge  He has associated itchy, watery eyes, and sneezing   Review of Systems  He specifically denies fever, chills, or sweats  He has no significant frontal sinus pain, facial pain, nasal purulence, dental pain, or sore throat  The cough is not associated with shortness of breath or wheezing. Again he has no purulent sputum.         Objective:   Physical Exam General appearance:good health ;well nourished; no acute distress or increased work of breathing is present.  No  lymphadenopathy about the head, neck, or axilla noted. Appears younger than stated age  Eyes: No conjunctival inflammation or lid edema is present. There is no scleral icterus.  Ears:  External ear exam shows no significant lesions or deformities.  Otoscopic examination reveals wax L > R Nose:  External nasal examination shows no deformity or inflammation. Nasal mucosa are pink and moist without lesions or exudates. No septal dislocation or deviation.No obstruction to airflow.   Oral exam: upper plate & lower partial; lips and gums are healthy appearing.There is no oropharyngeal erythema or exudate noted.   Neck:  No deformities, masses, or tenderness noted.   Supple with full range of motion without pain.   Heart:  Normal rate and regular rhythm. S1 and S2 normal without gallop, murmur, click, rub or other extra sounds.   Lungs:Chest clear to auscultation; no wheezes, rhonchi,rales ,or rubs present.No increased work of breathing.    Extremities:  No cyanosis, edema, or clubbing   noted    Skin: Warm & dry .                     Assessment & Plan:  #1 allergic rhinitis #2 cerumen impaction See orders & AVS

## 2014-04-05 ENCOUNTER — Ambulatory Visit (INDEPENDENT_AMBULATORY_CARE_PROVIDER_SITE_OTHER): Payer: Medicare Other | Admitting: Internal Medicine

## 2014-04-05 ENCOUNTER — Telehealth: Payer: Self-pay | Admitting: Internal Medicine

## 2014-04-05 ENCOUNTER — Other Ambulatory Visit (INDEPENDENT_AMBULATORY_CARE_PROVIDER_SITE_OTHER): Payer: Medicare Other

## 2014-04-05 ENCOUNTER — Encounter: Payer: Self-pay | Admitting: Internal Medicine

## 2014-04-05 VITALS — BP 142/72 | HR 74 | Temp 98.3°F | Resp 14 | Ht 67.75 in | Wt 142.0 lb

## 2014-04-05 DIAGNOSIS — R7309 Other abnormal glucose: Secondary | ICD-10-CM

## 2014-04-05 DIAGNOSIS — I10 Essential (primary) hypertension: Secondary | ICD-10-CM

## 2014-04-05 DIAGNOSIS — D649 Anemia, unspecified: Secondary | ICD-10-CM | POA: Diagnosis not present

## 2014-04-05 DIAGNOSIS — I6529 Occlusion and stenosis of unspecified carotid artery: Secondary | ICD-10-CM | POA: Diagnosis not present

## 2014-04-05 DIAGNOSIS — E785 Hyperlipidemia, unspecified: Secondary | ICD-10-CM

## 2014-04-05 LAB — BASIC METABOLIC PANEL
BUN: 34 mg/dL — ABNORMAL HIGH (ref 6–23)
CHLORIDE: 104 meq/L (ref 96–112)
CO2: 27 meq/L (ref 19–32)
Calcium: 9.7 mg/dL (ref 8.4–10.5)
Creatinine, Ser: 2.4 mg/dL — ABNORMAL HIGH (ref 0.4–1.5)
GFR: 33.41 mL/min — ABNORMAL LOW (ref >60.00)
Glucose, Bld: 91 mg/dL (ref 70–99)
Potassium: 4.9 mEq/L (ref 3.5–5.1)
SODIUM: 140 meq/L (ref 135–145)

## 2014-04-05 LAB — CBC WITH DIFFERENTIAL/PLATELET
Basophils Absolute: 0 10*3/uL (ref 0.0–0.1)
Basophils Relative: 0.5 % (ref 0.0–3.0)
EOS PCT: 2.3 % (ref 0.0–5.0)
Eosinophils Absolute: 0.2 10*3/uL (ref 0.0–0.7)
HCT: 33.8 % — ABNORMAL LOW (ref 39.0–52.0)
Hemoglobin: 11 g/dL — ABNORMAL LOW (ref 13.0–17.0)
LYMPHS PCT: 25.7 % (ref 12.0–46.0)
Lymphs Abs: 2.2 10*3/uL (ref 0.7–4.0)
MCHC: 32.6 g/dL (ref 30.0–36.0)
MCV: 88.5 fl (ref 78.0–100.0)
MONO ABS: 0.9 10*3/uL (ref 0.1–1.0)
MONOS PCT: 10.7 % (ref 3.0–12.0)
Neutro Abs: 5.3 10*3/uL (ref 1.4–7.7)
Neutrophils Relative %: 60.8 % (ref 43.0–77.0)
PLATELETS: 206 10*3/uL (ref 150.0–400.0)
RBC: 3.82 Mil/uL — AB (ref 4.22–5.81)
RDW: 14.6 % (ref 11.5–15.5)
WBC: 8.6 10*3/uL (ref 4.0–10.5)

## 2014-04-05 LAB — IBC PANEL
IRON: 42 ug/dL (ref 42–165)
Saturation Ratios: 9.9 % — ABNORMAL LOW (ref 20.0–50.0)
Transferrin: 303.4 mg/dL (ref 212.0–360.0)

## 2014-04-05 LAB — HEMOGLOBIN A1C: Hgb A1c MFr Bld: 6.1 % (ref 4.6–6.5)

## 2014-04-05 LAB — TSH: TSH: 3.1 u[IU]/mL (ref 0.35–4.50)

## 2014-04-05 MED ORDER — FUROSEMIDE 40 MG PO TABS: ORAL_TABLET | ORAL | Status: DC

## 2014-04-05 NOTE — Progress Notes (Signed)
   Subjective:    Patient ID: Ryan Cortez, male    DOB: May 08, 1927, 80 y.o.   MRN: [id number]  HPI He is here to assess active health issues & conditions. PMH, FH, & Social history verified & updated   Blood pressure range / average : 137/65-140/72 Compliant with anti hypertemsive medication. No lightheadedness or other adverse medication effect described.  A modified low salt diet is followed. Exercise as walking on job for [employer].  Family history is + for HTN ,CVA & MI    Review of Systems   Significant headaches, epistaxis, chest pain, palpitations, exertional dyspnea, claudication, paroxysmal nocturnal dyspnea, or edema absent.  He is followed by Dr Justin Mend for renal insufficiency; ? Date of next appointment.Dr Jason Nest 10/14 OV note was reviewed.       Objective:   Physical Exam Gen.: Thin but healthy and well-nourished in appearance. Alert, appropriate and cooperative throughout exam. Appears younger than stated age  Head: Normocephalic without obvious abnormalities;  Head shaven  Eyes: No corneal or conjunctival inflammation noted. Pupils equal round reactive to light and accommodation. Extraocular motion intact. Arcus Ears: External  ear exam reveals no significant lesions or deformities. Canals clear .TMs normal. Hearing is grossly decreased bilaterally. Nose: External nasal exam reveals no deformity or inflammation. Nasal mucosa are pink and moist. No lesions or exudates noted.   Mouth: Oral mucosa and oropharynx reveal no lesions or exudates. Upper plate ; lower partial Neck: No deformities, masses, or tenderness noted. Range of motion good; Thyroid normal Lungs: Normal respiratory effort; chest expands symmetrically. Lungs are clear to auscultation without rales, wheezes, or increased work of breathing. Heart: Normal rate and rhythm. Normal S1 and S2. No gallop, click, or rub. No murmur. Abdomen: Bowel sounds normal; abdomen soft and nontender. No masses,  organomegaly or hernias noted. Genitalia: deferred (age)                                 Musculoskeletal/extremities:  There is slight asymmetry of the posterior thoracic musculature suggesting occult scoliosis. No clubbing, cyanosis, edema, or significant extremity  deformity noted. Range of motion normal .Tone & strength normal. Hand joints normal  Fingernail health good. Able to lie down & sit up w/o help. Negative SLR bilaterally Vascular: Carotid, radial artery, dorsalis pedis and  posterior tibial pulses are equal. PTP decreased > DPP.No bruits present. Neurologic: Alert and oriented x3. Deep tendon reflexes symmetrical and normal.  Gait normal .     Skin: Intact without suspicious lesions or rashes. Lymph: No cervical, axillary lymphadenopathy present. Psych: Mood and affect are normal. Normally interactive                                                                                        Assessment & Plan:  See Current Assessment & Plan in Problem List under specific Diagnosis

## 2014-04-05 NOTE — Assessment & Plan Note (Signed)
A1c

## 2014-04-05 NOTE — Progress Notes (Signed)
Pre visit review using our clinic review tool, if applicable. No additional management support is needed unless otherwise documented below in the visit note. 

## 2014-04-05 NOTE — Patient Instructions (Signed)

## 2014-04-05 NOTE — Assessment & Plan Note (Signed)
CBC & dif IBC

## 2014-04-05 NOTE — Assessment & Plan Note (Signed)
Blood pressure goals reviewed. BMET 

## 2014-04-05 NOTE — Assessment & Plan Note (Signed)
TSH 

## 2014-04-05 NOTE — Telephone Encounter (Signed)
Relevant patient education mailed to patient.  

## 2014-05-11 ENCOUNTER — Other Ambulatory Visit: Payer: Self-pay | Admitting: Internal Medicine

## 2014-08-22 ENCOUNTER — Other Ambulatory Visit: Payer: Self-pay | Admitting: Internal Medicine

## 2014-09-20 DIAGNOSIS — N189 Chronic kidney disease, unspecified: Secondary | ICD-10-CM | POA: Diagnosis not present

## 2014-09-20 DIAGNOSIS — D631 Anemia in chronic kidney disease: Secondary | ICD-10-CM | POA: Diagnosis not present

## 2014-09-20 DIAGNOSIS — N184 Chronic kidney disease, stage 4 (severe): Secondary | ICD-10-CM | POA: Diagnosis not present

## 2014-09-20 DIAGNOSIS — R809 Proteinuria, unspecified: Secondary | ICD-10-CM | POA: Diagnosis not present

## 2014-09-20 DIAGNOSIS — N2581 Secondary hyperparathyroidism of renal origin: Secondary | ICD-10-CM | POA: Diagnosis not present

## 2015-03-27 ENCOUNTER — Ambulatory Visit (INDEPENDENT_AMBULATORY_CARE_PROVIDER_SITE_OTHER): Payer: Medicare Other | Admitting: Internal Medicine

## 2015-03-27 ENCOUNTER — Other Ambulatory Visit (INDEPENDENT_AMBULATORY_CARE_PROVIDER_SITE_OTHER): Payer: Medicare Other

## 2015-03-27 ENCOUNTER — Encounter: Payer: Self-pay | Admitting: Internal Medicine

## 2015-03-27 VITALS — BP 144/82 | HR 79 | Temp 98.0°F | Wt 143.2 lb

## 2015-03-27 DIAGNOSIS — I10 Essential (primary) hypertension: Secondary | ICD-10-CM

## 2015-03-27 DIAGNOSIS — M79672 Pain in left foot: Secondary | ICD-10-CM

## 2015-03-27 DIAGNOSIS — M25552 Pain in left hip: Secondary | ICD-10-CM

## 2015-03-27 MED ORDER — LOSARTAN POTASSIUM 100 MG PO TABS: 100.0000 mg | ORAL_TABLET | Freq: Every day | ORAL | Status: DC

## 2015-03-27 MED ORDER — TRAMADOL HCL 50 MG PO TABS: ORAL_TABLET | ORAL | Status: DC

## 2015-03-27 MED ORDER — AMLODIPINE BESYLATE 5 MG PO TABS: 5.0000 mg | ORAL_TABLET | Freq: Every day | ORAL | Status: DC

## 2015-03-27 NOTE — Progress Notes (Signed)
Pre visit review using our clinic review tool, if applicable. No additional management support is needed unless otherwise documented below in the visit note. 

## 2015-03-27 NOTE — Patient Instructions (Signed)

## 2015-03-27 NOTE — Progress Notes (Signed)
   Subjective:    Patient ID: Ryan Cortez, male    DOB: [birth year], 81 y.o.   MRN: [id number]  HPI  He has had intermittent pain in the dorsum of the left foot for the last month. Approximately 2 months ago he dropped a glass table on the left foot that had pain only @ the great toe.  The pain is described as sharp up to level VII. As of 5/16 it's actually resolved. He has some residual numbness in the dorsum of the foot.  Over the last 2 weeks he's now had pain in the left hip which he describes up to level IV.  He's had some weakness in the left lower extremity.  BP averages < 140/90.  Review of Systems He denies fever, chills, sweats, weight loss. There's been no localized joint redness or swelling. He denies any change in color or temperature of the skin in the area of the pain. He's had no loss of control of bladder or bowels.  Chest pain, palpitations, tachycardia, exertional dyspnea, paroxysmal nocturnal dyspnea, claudication or edema are absent.      Objective:   Physical Exam  Pertinent or positive findings include: He appears much younger than stated age.  He has a loud S4.  Breath sounds are decreased.  There are flexion contractures of the fifth digits bilaterally greater in the left hand than the right.  He has a slight foot drop with heel walking on the left.  Pedal pulses are decreased.  An exostosis is present over the dorsum of the left foot. This is slightly tender.  The hip range of motion is excellent without pain. Straight leg raising is negative bilaterally.  General appearance :adequately nourished; in no distress. Eyes: No conjunctival inflammation or scleral icterus is present. Heart:  Normal rate and regular rhythm. S1 and S2 normal without gallop, murmur, click, or rub. Lungs:Chest clear to auscultation; no wheezes, rhonchi,rales ,or rubs present.No increased work of breathing.  Abdomen: bowel sounds normal, soft and non-tender without masses,  organomegaly or hernias noted.  No guarding or rebound. No AAA Vascular : all pulses equal ; no bruits present. Skin:Warm & dry.  Intact without suspicious lesions or rashes ; no tenting  Lymphatic: No lymphadenopathy is noted about the head, neck, axilla Neuro: Strength, tone & DTRs normal.        Assessment & Plan:  #1 foot pain, essentially resolved  #2 left hip pain most likely related to adjustment in his gait from #1  #3 HTN Plan: See orders and recommendations

## 2015-03-28 LAB — BASIC METABOLIC PANEL
BUN: 34 mg/dL — ABNORMAL HIGH (ref 6–23)
CALCIUM: 9.6 mg/dL (ref 8.4–10.5)
CO2: 25 mEq/L (ref 19–32)
Chloride: 105 mEq/L (ref 96–112)
Creatinine, Ser: 2.32 mg/dL — ABNORMAL HIGH (ref 0.40–1.50)
GFR: 34.49 mL/min — AB (ref >60.00)
Glucose, Bld: 96 mg/dL (ref 70–99)
Potassium: 4.5 mEq/L (ref 3.5–5.1)
SODIUM: 140 meq/L (ref 135–145)

## 2015-07-20 ENCOUNTER — Ambulatory Visit (INDEPENDENT_AMBULATORY_CARE_PROVIDER_SITE_OTHER): Payer: Medicare Other | Admitting: Internal Medicine

## 2015-07-20 ENCOUNTER — Encounter: Payer: Self-pay | Admitting: Internal Medicine

## 2015-07-20 VITALS — BP 138/64 | HR 89 | Temp 98.7°F | Ht 68.0 in | Wt 137.0 lb

## 2015-07-20 DIAGNOSIS — I10 Essential (primary) hypertension: Secondary | ICD-10-CM | POA: Diagnosis not present

## 2015-07-20 DIAGNOSIS — J309 Allergic rhinitis, unspecified: Secondary | ICD-10-CM

## 2015-07-20 MED ORDER — FUROSEMIDE 40 MG PO TABS: ORAL_TABLET | ORAL | Status: DC

## 2015-07-20 NOTE — Patient Instructions (Addendum)
Minimal Blood Pressure Goal= AVERAGE < 140/90;  Ideal is an AVERAGE < 135/85. This AVERAGE should be calculated from @ least 5-7 BP readings taken @ different times of day on different days of week. You should not respond to isolated BP readings , but rather the AVERAGE for that week .Please bring your  blood pressure cuff to office visits to verify that it is reliable.It  can also be checked against the blood pressure device at the pharmacy. Finger or wrist cuffs are not dependable; an arm cuff is.  Plain Mucinex (NOT D) for thick secretions ;force NON dairy fluids .   Fluticasone  Or Nasacort AQ 1 spray in each nostril twice a day as needed. Use the "crossover" technique into opposite nostril spraying toward opposite ear @ 45 degree angle, not straight up into nostril.  Plain Allegra (NOT D )  160 daily , Loratidine 10 mg , OR Zyrtec 10 mg @ bedtime  as needed for itchy eyes & sneezing.

## 2015-07-20 NOTE — Progress Notes (Signed)
Pre visit review using our clinic review tool, if applicable. No additional management support is needed unless otherwise documented below in the visit note. 

## 2015-07-20 NOTE — Progress Notes (Signed)
   Subjective:    Patient ID: Ryan Cortez, male    DOB: [birth year], 81 y.o.   MRN: [id number]  HPI  He is here for follow-up of his blood pressure. Blood pressure at home ranges 123/58-143/68. He worries about the fluctuation. He had dizziness 1 day this week but this was an isolated phenomena.  He denies active cardiopulmonary symptoms.  Review of Systems Chest pain, palpitations, tachycardia, exertional dyspnea, paroxysmal nocturnal dyspnea, claudication or edema are absent.  He describes nasal congestion as well as itchy, watery eyes.      Objective:   Physical Exam Pattern alopecia is present. He has a mustache. Arcus senilis is noted. He has an upper plate and lower partial. He does have somewhat of a hyponasal speech pattern. Pedal pulses are decreased, especially dorsalis pedis pulses. He has flexion contractures of the fifth fingers, greater on the left than the right.  Pertinent or positive findings include: General appearance :adequately nourished; in no distress.  Eyes: No conjunctival inflammation or scleral icterus is present.  Oral exam:  Lips and gums are healthy appearing.There is no oropharyngeal erythema or exudate noted.  Heart:  Normal rate and regular rhythm. S1 and S2 normal without gallop, murmur, click, rub or other extra sounds    Lungs:Chest clear to auscultation; no wheezes, rhonchi,rales ,or rubs present.No increased work of breathing.   Abdomen: bowel sounds normal, soft and non-tender without masses, organomegaly or hernias noted.  No guarding or rebound.   Vascular : all pulses equal ; no bruits present.  Skin:Warm & dry.  Intact without suspicious lesions or rashes ; no tenting  Lymphatic: No lymphadenopathy is noted about the head, neck, axilla.   Neuro: Strength, tone & DTRs normal.     Assessment & Plan:  #1 HTN  #2 allergic rhinitis  See orders

## 2015-10-23 ENCOUNTER — Telehealth: Payer: Self-pay

## 2015-10-23 NOTE — Telephone Encounter (Signed)
Left Voice Mail for pt to call back.   RE: Flu Vaccine for 2016  

## 2015-11-06 ENCOUNTER — Telehealth: Payer: Self-pay

## 2015-11-06 NOTE — Telephone Encounter (Signed)
Fup regarding AWV; STated he is coming in next Tuesday Jan 2 at 8am. Agreed to stay post MD visit for AWV around 8:30

## 2015-11-13 ENCOUNTER — Other Ambulatory Visit (INDEPENDENT_AMBULATORY_CARE_PROVIDER_SITE_OTHER): Payer: Medicare Other

## 2015-11-13 ENCOUNTER — Ambulatory Visit (INDEPENDENT_AMBULATORY_CARE_PROVIDER_SITE_OTHER): Payer: Medicare Other | Admitting: Internal Medicine

## 2015-11-13 ENCOUNTER — Encounter: Payer: Self-pay | Admitting: Internal Medicine

## 2015-11-13 VITALS — BP 162/72 | HR 74 | Temp 98.2°F | Resp 12 | Ht 69.0 in | Wt 136.1 lb

## 2015-11-13 DIAGNOSIS — N183 Chronic kidney disease, stage 3 unspecified: Secondary | ICD-10-CM

## 2015-11-13 DIAGNOSIS — Z0000 Encounter for general adult medical examination without abnormal findings: Secondary | ICD-10-CM | POA: Insufficient documentation

## 2015-11-13 DIAGNOSIS — R5383 Other fatigue: Secondary | ICD-10-CM

## 2015-11-13 DIAGNOSIS — I10 Essential (primary) hypertension: Secondary | ICD-10-CM | POA: Diagnosis not present

## 2015-11-13 DIAGNOSIS — E789 Disorder of lipoprotein metabolism, unspecified: Secondary | ICD-10-CM

## 2015-11-13 LAB — COMPREHENSIVE METABOLIC PANEL
ALBUMIN: 4.1 g/dL (ref 3.5–5.2)
ALK PHOS: 39 U/L (ref 39–117)
ALT: 21 U/L (ref 0–53)
AST: 29 U/L (ref 0–37)
BUN: 47 mg/dL — AB (ref 6–23)
CALCIUM: 9.8 mg/dL (ref 8.4–10.5)
CHLORIDE: 102 meq/L (ref 96–112)
CO2: 28 mEq/L (ref 19–32)
CREATININE: 2.54 mg/dL — AB (ref 0.40–1.50)
GFR: 31.02 mL/min — ABNORMAL LOW (ref >60.00)
Glucose, Bld: 91 mg/dL (ref 70–99)
POTASSIUM: 4.3 meq/L (ref 3.5–5.1)
Sodium: 140 mEq/L (ref 135–145)
TOTAL PROTEIN: 7.5 g/dL (ref 6.0–8.3)
Total Bilirubin: 0.4 mg/dL (ref 0.2–1.2)

## 2015-11-13 LAB — CBC
HCT: 35.7 % — ABNORMAL LOW (ref 39.0–52.0)
Hemoglobin: 11.4 g/dL — ABNORMAL LOW (ref 13.0–17.0)
MCHC: 31.8 g/dL (ref 30.0–36.0)
MCV: 87.1 fl (ref 78.0–100.0)
PLATELETS: 235 10*3/uL (ref 150.0–400.0)
RBC: 4.1 Mil/uL — AB (ref 4.22–5.81)
RDW: 14.9 % (ref 11.5–15.5)
WBC: 8.9 10*3/uL (ref 4.0–10.5)

## 2015-11-13 LAB — LIPID PANEL
CHOL/HDL RATIO: 4
Cholesterol: 250 mg/dL — ABNORMAL HIGH (ref 0–200)
HDL: 65.9 mg/dL (ref >39.00)
LDL CALC: 166 mg/dL — AB (ref 0–99)
NonHDL: 183.9
TRIGLYCERIDES: 92 mg/dL (ref 0.0–149.0)
VLDL: 18.4 mg/dL (ref 0.0–40.0)

## 2015-11-13 LAB — TSH: TSH: 4.37 u[IU]/mL (ref 0.35–4.50)

## 2015-11-13 MED ORDER — LOSARTAN POTASSIUM 100 MG PO TABS: 100.0000 mg | ORAL_TABLET | Freq: Every day | ORAL | Status: DC

## 2015-11-13 MED ORDER — FUROSEMIDE 40 MG PO TABS: ORAL_TABLET | ORAL | Status: DC

## 2015-11-13 MED ORDER — AMLODIPINE BESYLATE 5 MG PO TABS: 5.0000 mg | ORAL_TABLET | Freq: Every day | ORAL | Status: DC

## 2015-11-13 NOTE — Assessment & Plan Note (Signed)
BP previously at goal on amlodipine, lasix, losartan. BP mildly elevated today due to being out of medicine. Refilled today and will get rechecked at nephrology visit next week.

## 2015-11-13 NOTE — Patient Instructions (Signed)
We are checking the blood work today and will call you back with the results. We will also send the results to Dr. Justin Mend so that he does not have to repeat blood work when you see him.   You are doing great with the health so stay active.   Health Maintenance, Male A healthy lifestyle and preventative care can promote health and wellness.  Maintain regular health, dental, and eye exams.  Eat a healthy diet. Foods like vegetables, fruits, whole grains, low-fat dairy products, and lean protein foods contain the nutrients you need and are low in calories. Decrease your intake of foods high in solid fats, added sugars, and salt. Get information about a proper diet from your health care provider, if necessary.  Regular physical exercise is one of the most important things you can do for your health. Most adults should get at least 150 minutes of moderate-intensity exercise (any activity that increases your heart rate and causes you to sweat) each week. In addition, most adults need muscle-strengthening exercises on 2 or more days a week.   Maintain a healthy weight. The body mass index (BMI) is a screening tool to identify possible weight problems. It provides an estimate of body fat based on height and weight. Your health care provider can find your BMI and can help you achieve or maintain a healthy weight. For males 20 years and older:  A BMI below 18.5 is considered underweight.  A BMI of 18.5 to 24.9 is normal.  A BMI of 25 to 29.9 is considered overweight.  A BMI of 30 and above is considered obese.  Maintain normal blood lipids and cholesterol by exercising and minimizing your intake of saturated fat. Eat a balanced diet with plenty of fruits and vegetables. Blood tests for lipids and cholesterol should begin at age 21 and be repeated every 5 years. If your lipid or cholesterol levels are high, you are over age 70, or you are at high risk for heart disease, you may need your cholesterol  levels checked more frequently.Ongoing high lipid and cholesterol levels should be treated with medicines if diet and exercise are not working.  If you smoke, find out from your health care provider how to quit. If you do not use tobacco, do not start.  Lung cancer screening is recommended for adults aged 64-80 years who are at high risk for developing lung cancer because of a history of smoking. A yearly low-dose CT scan of the lungs is recommended for people who have at least a 30-pack-year history of smoking and are current smokers or have quit within the past 15 years. A pack year of smoking is smoking an average of 1 pack of cigarettes a day for 1 year (for example, a 30-pack-year history of smoking could mean smoking 1 pack a day for 30 years or 2 packs a day for 15 years). Yearly screening should continue until the smoker has stopped smoking for at least 15 years. Yearly screening should be stopped for people who develop a health problem that would prevent them from having lung cancer treatment.  If you choose to drink alcohol, do not have more than 2 drinks per day. One drink is considered to be 12 oz (360 mL) of beer, 5 oz (150 mL) of wine, or 1.5 oz (45 mL) of liquor.  Avoid the use of street drugs. Do not share needles with anyone. Ask for help if you need support or instructions about stopping the use of drugs.  High blood pressure causes heart disease and increases the risk of stroke. High blood pressure is more likely to develop in:  People who have blood pressure in the end of the normal range (100-139/85-89 mm Hg).  People who are overweight or obese.  People who are African American.  If you are 58-22 years of age, have your blood pressure checked every 3-5 years. If you are 75 years of age or older, have your blood pressure checked every year. You should have your blood pressure measured twice--once when you are at a hospital or clinic, and once when you are not at a hospital or  clinic. Record the average of the two measurements. To check your blood pressure when you are not at a hospital or clinic, you can use:  An automated blood pressure machine at a pharmacy.  A home blood pressure monitor.  If you are 53-45 years old, ask your health care provider if you should take aspirin to prevent heart disease.  Diabetes screening involves taking a blood sample to check your fasting blood sugar level. This should be done once every 3 years after age 56 if you are at a normal weight and without risk factors for diabetes. Testing should be considered at a younger age or be carried out more frequently if you are overweight and have at least 1 risk factor for diabetes.  Colorectal cancer can be detected and often prevented. Most routine colorectal cancer screening begins at the age of 42 and continues through age 64. However, your health care provider may recommend screening at an earlier age if you have risk factors for colon cancer. On a yearly basis, your health care provider may provide home test kits to check for hidden blood in the stool. A small camera at the end of a tube may be used to directly examine the colon (sigmoidoscopy or colonoscopy) to detect the earliest forms of colorectal cancer. Talk to your health care provider about this at age 54 when routine screening begins. A direct exam of the colon should be repeated every 5-10 years through age 58, unless early forms of precancerous polyps or small growths are found.  People who are at an increased risk for hepatitis B should be screened for this virus. You are considered at high risk for hepatitis B if:  You were born in a country where hepatitis B occurs often. Talk with your health care provider about which countries are considered high risk.  Your parents were born in a high-risk country and you have not received a shot to protect against hepatitis B (hepatitis B vaccine).  You have HIV or AIDS.  You use needles  to inject street drugs.  You live with, or have sex with, someone who has hepatitis B.  You are a man who has sex with other men (MSM).  You get hemodialysis treatment.  You take certain medicines for conditions like cancer, organ transplantation, and autoimmune conditions.  Hepatitis C blood testing is recommended for all people born from [birth year] through 1963 and any individual with known risk factors for hepatitis C.  Healthy men should no longer receive prostate-specific antigen (PSA) blood tests as part of routine cancer screening. Talk to your health care provider about prostate cancer screening.  Testicular cancer screening is not recommended for adolescents or adult males who have no symptoms. Screening includes self-exam, a health care provider exam, and other screening tests. Consult with your health care provider about any symptoms you have or any  concerns you have about testicular cancer.  Practice safe sex. Use condoms and avoid high-risk sexual practices to reduce the spread of sexually transmitted infections (STIs).  You should be screened for STIs, including gonorrhea and chlamydia if:  You are sexually active and are younger than 24 years.  You are older than 24 years, and your health care provider tells you that you are at risk for this type of infection.  Your sexual activity has changed since you were last screened, and you are at an increased risk for chlamydia or gonorrhea. Ask your health care provider if you are at risk.  If you are at risk of being infected with HIV, it is recommended that you take a prescription medicine daily to prevent HIV infection. This is called pre-exposure prophylaxis (PrEP). You are considered at risk if:  You are a man who has sex with other men (MSM).  You are a heterosexual man who is sexually active with multiple partners.  You take drugs by injection.  You are sexually active with a partner who has HIV.  Talk with your health  care provider about whether you are at high risk of being infected with HIV. If you choose to begin PrEP, you should first be tested for HIV. You should then be tested every 3 months for as long as you are taking PrEP.  Use sunscreen. Apply sunscreen liberally and repeatedly throughout the day. You should seek shade when your shadow is shorter than you. Protect yourself by wearing long sleeves, pants, a wide-brimmed hat, and sunglasses year round whenever you are outdoors.  Tell your health care provider of new moles or changes in moles, especially if there is a change in shape or color. Also, tell your health care provider if a mole is larger than the size of a pencil eraser.  A one-time screening for abdominal aortic aneurysm (AAA) and surgical repair of large AAAs by ultrasound is recommended for men aged 41-75 years who are current or former smokers.  Stay current with your vaccines (immunizations).   This information is not intended to replace advice given to you by your health care provider. Make sure you discuss any questions you have with your health care provider.   Document Released: 04/24/2008 Document Revised: 11/17/2014 Document Reviewed: 03/24/2011 Elsevier Interactive Patient Education [employer].

## 2015-11-13 NOTE — Assessment & Plan Note (Signed)
Aged out of colonoscopy screening. Declines all immunizations today. Counseled about the benefits of immunizations. Low risk for falls at home. Able to do all ADLs. No confusion with medications.

## 2015-11-13 NOTE — Progress Notes (Signed)
Pre visit review using our clinic review tool, if applicable. No additional management support is needed unless otherwise documented below in the visit note. 

## 2015-11-13 NOTE — Progress Notes (Signed)
   Subjective:    Patient ID: Ryan Cortez, male    DOB: 07-Nov-1927, 82 y.o.   MRN: [id number]  HPI Here for medicare wellness, no new complaints. Please see A/P for status and treatment of chronic medical problems.   Diet: heart healthy Physical activity: active Depression/mood screen: negative Hearing: some decline, needs hearing test Visual acuity: grossly normal with lens, performs annual eye exam  ADLs: capable Fall risk: none Home safety: good Cognitive evaluation: intact to orientation, naming, recall and repetition EOL planning: adv directives discussed, not in place  I have personally reviewed and have noted 1. The patient's medical and social history - reviewed today no changes 2. Their use of alcohol, tobacco or illicit drugs 3. Their current medications and supplements 4. The patient's functional ability including ADL's, fall risks, home safety risks and hearing or visual impairment. 5. Diet and physical activities 6. Evidence for depression or mood disorders 7. Care team reviewed and updated (available in snapshot)  Review of Systems  Constitutional: Negative for fever, chills, activity change, appetite change, fatigue and unexpected weight change.  HENT: Negative.   Eyes: Negative.   Respiratory: Negative for cough, chest tightness, shortness of breath and wheezing.   Cardiovascular: Negative for chest pain, palpitations and leg swelling.  Gastrointestinal: Negative for nausea, abdominal pain, diarrhea, constipation and abdominal distention.  Musculoskeletal: Positive for arthralgias. Negative for back pain and gait problem.  Skin: Negative.   Neurological: Negative.   Psychiatric/Behavioral: Negative.       Objective:   Physical Exam  Constitutional: He is oriented to person, place, and time. He appears well-developed and well-nourished.  Slightly thin  HENT:  Head: Normocephalic and atraumatic.  Slightly hard of hearing  Eyes: EOM are normal.  Neck:  Normal range of motion.  Cardiovascular: Normal rate and regular rhythm.   Poor peripheral pulses in PT  Pulmonary/Chest: Effort normal and breath sounds normal. No respiratory distress. He has no wheezes. He has no rales.  Abdominal: Soft. Bowel sounds are normal. He exhibits no distension. There is no tenderness. There is no rebound.  Musculoskeletal: He exhibits no edema.  Neurological: He is alert and oriented to person, place, and time. Coordination normal.  Skin: Skin is warm and dry.  Psychiatric: He has a normal mood and affect.   Filed Vitals:   11/13/15 0804  BP: 158/72  Pulse: 74  Temp: 98.2 F (36.8 C)  TempSrc: Oral  Resp: 12  Height: 5\' 9"  (1.753 m)  Weight: 136 lb 1.9 oz (61.744 kg)  SpO2: 98%      Assessment & Plan:

## 2015-11-13 NOTE — Assessment & Plan Note (Signed)
Seeing Dr. Justin Mend and checking labs today and forward to him. Checking CMP, PTH, CBC today. BP slightly above goal.

## 2015-11-13 NOTE — Assessment & Plan Note (Signed)
Not on statin and checking lipid panel today.

## 2015-11-14 LAB — PTH, INTACT AND CALCIUM
CALCIUM: 9.6 mg/dL (ref 8.4–10.5)
PTH: 72 pg/mL — AB (ref 14–64)

## 2015-11-21 DIAGNOSIS — R809 Proteinuria, unspecified: Secondary | ICD-10-CM | POA: Diagnosis not present

## 2015-11-21 DIAGNOSIS — N2581 Secondary hyperparathyroidism of renal origin: Secondary | ICD-10-CM | POA: Diagnosis not present

## 2015-11-21 DIAGNOSIS — D631 Anemia in chronic kidney disease: Secondary | ICD-10-CM | POA: Diagnosis not present

## 2015-11-21 DIAGNOSIS — N184 Chronic kidney disease, stage 4 (severe): Secondary | ICD-10-CM | POA: Diagnosis not present

## 2016-03-03 ENCOUNTER — Other Ambulatory Visit: Payer: Self-pay | Admitting: Internal Medicine

## 2016-03-03 DIAGNOSIS — I6523 Occlusion and stenosis of bilateral carotid arteries: Secondary | ICD-10-CM

## 2016-03-06 ENCOUNTER — Ambulatory Visit (HOSPITAL_COMMUNITY)
Admission: RE | Admit: 2016-03-06 | Discharge: 2016-03-06 | Disposition: A | Discharge status: Home / Self Care | Payer: Medicare Other | Source: Ambulatory Visit | Attending: Cardiology | Admitting: Cardiology

## 2016-03-06 DIAGNOSIS — E785 Hyperlipidemia, unspecified: Secondary | ICD-10-CM | POA: Diagnosis not present

## 2016-03-06 DIAGNOSIS — I10 Essential (primary) hypertension: Secondary | ICD-10-CM | POA: Diagnosis not present

## 2016-03-06 DIAGNOSIS — I6523 Occlusion and stenosis of bilateral carotid arteries: Secondary | ICD-10-CM

## 2016-03-13 ENCOUNTER — Telehealth: Payer: Self-pay | Admitting: Internal Medicine

## 2016-03-13 NOTE — Telephone Encounter (Signed)
Pt request test result that was done on 03/06/16. Please give him a call back, he has been trying to get in touch with our office.

## 2016-03-13 NOTE — Telephone Encounter (Signed)
Tried to reach patient, no answer. Left message.

## 2016-03-17 ENCOUNTER — Other Ambulatory Visit: Payer: Self-pay | Admitting: Geriatric Medicine

## 2016-03-17 NOTE — Telephone Encounter (Signed)
Can you call on him [phone].

## 2016-03-18 NOTE — Telephone Encounter (Signed)
Tried to reach patient. No answer, left message.

## 2016-08-01 ENCOUNTER — Encounter: Payer: Self-pay | Admitting: Internal Medicine

## 2016-08-01 ENCOUNTER — Ambulatory Visit (INDEPENDENT_AMBULATORY_CARE_PROVIDER_SITE_OTHER): Payer: Medicare Other | Admitting: Internal Medicine

## 2016-08-01 DIAGNOSIS — I10 Essential (primary) hypertension: Secondary | ICD-10-CM

## 2016-08-01 DIAGNOSIS — I6523 Occlusion and stenosis of bilateral carotid arteries: Secondary | ICD-10-CM | POA: Diagnosis not present

## 2016-08-01 DIAGNOSIS — N400 Enlarged prostate without lower urinary tract symptoms: Secondary | ICD-10-CM

## 2016-08-01 NOTE — Progress Notes (Signed)
   Subjective:    Patient ID: Ryan Cortez, male    DOB: [birth year], 82 y.o.   MRN: [id number]  HPI The patient is an 82 YO man coming in for follow up on his blood pressure (taking lasix, amlodipine, and losartan). It is complicated by CKD stage 4. He is doing well overall. Some decrease in energy at times. Better when he can sleep the whole night. He is still seeing nephrology. No headaches or chest pains. No SOB or abdominal pain.   Review of Systems  Constitutional: Negative for activity change, appetite change, chills, fatigue, fever and unexpected weight change.  Respiratory: Negative for cough, chest tightness, shortness of breath and wheezing.   Cardiovascular: Negative for chest pain, palpitations and leg swelling.  Gastrointestinal: Negative for abdominal distention, abdominal pain, constipation, diarrhea and nausea.  Musculoskeletal: Positive for arthralgias. Negative for back pain and gait problem.  Neurological: Negative.   Psychiatric/Behavioral: Negative.       Objective:   Physical Exam  Constitutional: He is oriented to person, place, and time. He appears well-developed and well-nourished.  Slightly thin  HENT:  Head: Normocephalic and atraumatic.  Slightly hard of hearing  Eyes: EOM are normal.  Neck: Normal range of motion.  Cardiovascular: Normal rate and regular rhythm.   Pulmonary/Chest: Effort normal and breath sounds normal. No respiratory distress. He has no wheezes. He has no rales.  Abdominal: Soft. He exhibits no distension. There is no tenderness. There is no rebound.  Musculoskeletal: He exhibits no edema.  Neurological: He is alert and oriented to person, place, and time. Coordination normal.  Skin: Skin is warm and dry.   Vitals:   08/01/16 1456  BP: 134/64  Pulse: 82  Resp: 16  Temp: 98.4 F (36.9 C)  TempSrc: Oral  SpO2: 98%  Weight: 136 lb (61.7 kg)  Height: 5\' 9"  (1.753 m)      Assessment & Plan:

## 2016-08-01 NOTE — Assessment & Plan Note (Signed)
BP at goal on lasix, amlodipine, losartan. Seeing nephrology for the CKD. Stable and will follow. Last CMP stable.

## 2016-08-01 NOTE — Patient Instructions (Signed)
We will see you back in about 6 months for the physical.   Think about getting the pneumonia shot when you come next time as this helps to protect you against the kinds of pneumonia that can make you end up in the hospital.

## 2016-08-01 NOTE — Progress Notes (Signed)
Pre visit review using our clinic review tool, if applicable. No additional management support is needed unless otherwise documented below in the visit note. 

## 2016-08-02 NOTE — Assessment & Plan Note (Signed)
Not on meds due to concurrent lasix making them not effective. No problems with urination.

## 2016-12-04 DIAGNOSIS — N184 Chronic kidney disease, stage 4 (severe): Secondary | ICD-10-CM | POA: Diagnosis not present

## 2016-12-04 DIAGNOSIS — N2581 Secondary hyperparathyroidism of renal origin: Secondary | ICD-10-CM | POA: Diagnosis not present

## 2016-12-04 DIAGNOSIS — D631 Anemia in chronic kidney disease: Secondary | ICD-10-CM | POA: Diagnosis not present

## 2016-12-04 DIAGNOSIS — R809 Proteinuria, unspecified: Secondary | ICD-10-CM | POA: Diagnosis not present

## 2017-01-09 DIAGNOSIS — N184 Chronic kidney disease, stage 4 (severe): Secondary | ICD-10-CM | POA: Diagnosis not present

## 2017-01-21 ENCOUNTER — Telehealth: Payer: Self-pay | Admitting: Internal Medicine

## 2017-01-21 NOTE — Telephone Encounter (Signed)
Patient called office and scheduled awv with health coach for 01/26/2017.

## 2017-01-21 NOTE — Telephone Encounter (Signed)
Called patient to schedule awv. Lvm for patient to call office to schedule appt.  °

## 2017-01-25 NOTE — Progress Notes (Signed)
Subjective:   Ryan Cortez is a 83 y.o. male who presents for Medicare Annual/Subsequent preventive examination.  Review of Systems:  No ROS.  Medicare Wellness Visit.  Cardiac Risk Factors include: advanced age (>48men, >63 women);dyslipidemia;hypertension;male gender;family history of premature cardiovascular disease Sleep patterns: no sleep issues, feels rested on waking, gets up 1 times nightly to void and sleeps 8 hours nightly.   Home Safety/Smoke Alarms: Feels safe in home. Smoke alarms in place.    Living environment; residence and Firearm Safety: 1-story house/ trailer, no firearms. Lives alone, states he has friend support close by. Seat Belt Safety/Bike Helmet: Wears seat belt.   Counseling:   Eye Exam-  Resources given Dental- dentures  Male:   CCS- Last 02/24/12, no recall due to age   PSA- Lab Results  Component Value Date   PSA 3.02 12/23/2011         Objective:    Vitals: BP (!) 156/72   Pulse 72   Resp 18   Ht 5\' 9"  (1.753 m)   Wt 136 lb (61.7 kg)   SpO2 98%   BMI 20.08 kg/m   Body mass index is 20.08 kg/m.  Tobacco History  Smoking Status  . Former Smoker  . Quit date: 11/11/1975  Smokeless Tobacco  . Never Used    Comment: smoked age [age],up to 1/2 ppd     Counseling given: Not Answered   Past Medical History:  Diagnosis Date  . Anemia    iron Rxed by Dr Ardis Hughs  . BPH (benign prostatic hyperplasia)   . Fasting hyperglycemia   . Hyperlipidemia   . Hypertension   . Other abnormal glucose 2008   A1c 6.1%  . Positive PPD 1991   no INH prophylaxis due to age  . PVD (peripheral vascular disease) ([place])   . Pyloric stenosis   . Renal insufficiency    Dr Justin Mend   Past Surgical History:  Procedure Laterality Date  . CHOLECYSTECTOMY    . COLONOSCOPY W/ POLYPECTOMY    . LIVER BIOPSY    . UPPER GASTROINTESTINAL ENDOSCOPY  02/2007   Dr. Oretha Caprice; gastritis,incomplete pyloric stricture with partial outlet obstruction   Family History    Problem Relation Age of Onset  . Stroke Father 32  . Hypertension Mother   . Heart attack Mother 7  . Cancer Maternal Aunt     ? primary  . Alcohol abuse Brother   . Diabetes Neg Hx    History  Sexual Activity  . Sexual activity: No    Outpatient Encounter Prescriptions as of 01/26/2017  Medication Sig  . amLODipine (NORVASC) 5 MG tablet Take 1 tablet (5 mg total) by mouth daily.  Marland Kitchen aspirin 81 MG tablet Take 81 mg by mouth daily.    . furosemide (LASIX) 40 MG tablet TAKE ONE TABLET BY MOUTH ONCE DAILY  . losartan (COZAAR) 100 MG tablet Take 1 tablet (100 mg total) by mouth daily.   No facility-administered encounter medications on file as of 01/26/2017.     Activities of Daily Living In your present state of health, do you have any difficulty performing the following activities: 01/26/2017  Hearing? N  Vision? N  Difficulty concentrating or making decisions? N  Walking or climbing stairs? N  Dressing or bathing? N  Doing errands, shopping? N  Preparing Food and eating ? N  Using the Toilet? N  In the past six months, have you accidently leaked urine? N  Do you have problems with  loss of bowel control? N  Managing your Medications? N  Managing your Finances? N  Housekeeping or managing your Housekeeping? N  Some recent data might be hidden    Patient Care Team: Hoyt Koch, MD as PCP - General (Internal Medicine)   Assessment:    Physical assessment deferred to PCP.  Exercise Activities and Dietary recommendations Current Exercise Habits: The patient does not participate in regular exercise at present, Exercise limited by: None identified  Diet (meal preparation, eat out, water intake, caffeinated beverages, dairy products, fruits and vegetables): in general, a "healthy" diet  , well balanced, on average, 2 meals per day. Patient states he often skips breakfast. Drinks 1 bottle of water per day, drinks tea, coffee and milk.  Encouraged patient not to skip  breakfast, suggested that he add high protein ensure or store name brand supplements to his diet, discussed the importance of water and educated to increase water intake to 3 bottles of water per day.       Goals    . Stay healthy          I want to start walking, drink 3 bottles of water a day, start to eat breakfast      Fall Risk Fall Risk  01/26/2017 08/01/2016 07/20/2015 04/01/2013  Falls in the past year? No No No No   Depression Screen PHQ 2/9 Scores 01/26/2017 08/01/2016 07/20/2015 04/01/2013  PHQ - 2 Score 0 0 0 0    Cognitive Function MMSE - Mini Mental State Exam 01/26/2017  Orientation to time 5  Orientation to Place 5  Registration 3  Attention/ Calculation 3  Recall 3  Language- name 2 objects 2  Language- repeat 1  Language- follow 3 step command 2  Language- read & follow direction 1  Write a sentence 1  Copy design 1  Total score 27         There is no immunization history on file for this patient. Screening Tests Health Maintenance  Topic Date Due  . TETANUS/TDAP  02/27/1946  . PNA vac Low Risk Adult (1 of 2 - PCV13) 02/26/1994  . INFLUENZA VACCINE  06/10/2016      Plan:     Continue to eat heart healthy diet (full of fruits, vegetables, whole grains, lean protein, water--limit salt, fat, and sugar intake) and increase physical activity as tolerated.  Continue doing brain stimulating activities (puzzles, reading, adult coloring books, staying active) to keep memory sharp.   Education provided to patient regarding flu and pneumonia vaccines, patient declined having vaccines today. Patient declined having PSA screening today, education completed.   During the course of the visit the patient was educated and counseled about the following appropriate screening and preventive services:   Vaccines to include Pneumoccal, Influenza, Hepatitis B, Td, Zostavax, HCV  Cardiovascular Disease  Colorectal cancer screening  Diabetes screening  Prostate Cancer  Screening  Glaucoma screening  Nutrition counseling   Patient Instructions (the written plan) was given to the patient.    Michiel Cowboy, RN  01/26/2017

## 2017-01-25 NOTE — Progress Notes (Signed)
Pre visit review using our clinic review tool, if applicable. No additional management support is needed unless otherwise documented below in the visit note. 

## 2017-01-26 ENCOUNTER — Ambulatory Visit (INDEPENDENT_AMBULATORY_CARE_PROVIDER_SITE_OTHER): Payer: Medicare Other | Admitting: *Deleted

## 2017-01-26 VITALS — BP 156/72 | HR 72 | Resp 18 | Ht 69.0 in | Wt 136.0 lb

## 2017-01-26 DIAGNOSIS — Z0000 Encounter for general adult medical examination without abnormal findings: Secondary | ICD-10-CM | POA: Diagnosis not present

## 2017-01-26 NOTE — Progress Notes (Signed)
Medical screening examination/treatment/procedure(s) were performed by non-physician practitioner and as supervising physician I was immediately available for consultation/collaboration. I agree with above. Elizabeth A Crawford, MD 

## 2017-01-26 NOTE — Patient Instructions (Addendum)
Continue to eat heart healthy diet (full of fruits, vegetables, whole grains, lean protein, water--limit salt, fat, and sugar intake) and increase physical activity as tolerated.   Continue doing brain stimulating activities (puzzles, reading, adult coloring books, staying active) to keep memory sharp.   Start drinking 3 bottles of water per day. Do not skip breakfast, drink high protein ensure or equate name brand supplements.  Fall Prevention in the Home Falls can cause injuries and can affect people from all age groups. There are many simple things that you can do to make your home safe and to help prevent falls. What can I do on the outside of my home?  Regularly repair the edges of walkways and driveways and fix any cracks.  Remove high doorway thresholds.  Trim any shrubbery on the main path into your home.  Use bright outdoor lighting.  Clear walkways of debris and clutter, including tools and rocks.  Regularly check that handrails are securely fastened and in good repair. Both sides of any steps should have handrails.  Install guardrails along the edges of any raised decks or porches.  Have leaves, snow, and ice cleared regularly.  Use sand or salt on walkways during winter months.  In the garage, clean up any spills right away, including grease or oil spills. What can I do in the bathroom?  Use night lights.  Install grab bars by the toilet and in the tub and shower. Do not use towel bars as grab bars.  Use non-skid mats or decals on the floor of the tub or shower.  If you need to sit down while you are in the shower, use a plastic, non-slip stool.  Keep the floor dry. Immediately clean up any water that spills on the floor.  Remove soap buildup in the tub or shower on a regular basis.  Attach bath mats securely with double-sided non-slip rug tape.  Remove throw rugs and other tripping hazards from the floor. What can I do in the bedroom?  Use night  lights.  Make sure that a bedside light is easy to reach.  Do not use oversized bedding that drapes onto the floor.  Have a firm chair that has side arms to use for getting dressed.  Remove throw rugs and other tripping hazards from the floor. What can I do in the kitchen?  Clean up any spills right away.  Avoid walking on wet floors.  Place frequently used items in easy-to-reach places.  If you need to reach for something above you, use a sturdy step stool that has a grab bar.  Keep electrical cables out of the way.  Do not use floor polish or wax that makes floors slippery. If you have to use wax, make sure that it is non-skid floor wax.  Remove throw rugs and other tripping hazards from the floor. What can I do in the stairways?  Do not leave any items on the stairs.  Make sure that there are handrails on both sides of the stairs. Fix handrails that are broken or loose. Make sure that handrails are as long as the stairways.  Check any carpeting to make sure that it is firmly attached to the stairs. Fix any carpet that is loose or worn.  Avoid having throw rugs at the top or bottom of stairways, or secure the rugs with carpet tape to prevent them from moving.  Make sure that you have a light switch at the top of the stairs and the bottom  of the stairs. If you do not have them, have them installed. What are some other fall prevention tips?  Wear closed-toe shoes that fit well and support your feet. Wear shoes that have rubber soles or low heels.  When you use a stepladder, make sure that it is completely opened and that the sides are firmly locked. Have someone hold the ladder while you are using it. Do not climb a closed stepladder.  Add color or contrast paint or tape to grab bars and handrails in your home. Place contrasting color strips on the first and last steps.  Use mobility aids as needed, such as canes, walkers, scooters, and crutches.  Turn on lights if it is  dark. Replace any light bulbs that burn out.  Set up furniture so that there are clear paths. Keep the furniture in the same spot.  Fix any uneven floor surfaces.  Choose a carpet design that does not hide the edge of steps of a stairway.  Be aware of any and all pets.  Review your medicines with your healthcare provider. Some medicines can cause dizziness or changes in blood pressure, which increase your risk of falling. Talk with your health care provider about other ways that you can decrease your risk of falls. This may include working with a physical therapist or trainer to improve your strength, balance, and endurance. This information is not intended to replace advice given to you by your health care provider. Make sure you discuss any questions you have with your health care provider. Document Released: 10/17/2002 Document Revised: 03/25/2016 Document Reviewed: 12/01/2014 Elsevier Interactive Patient Education  2017 [place] Maintenance, Male A healthy lifestyle and preventive care is important for your health and wellness. Ask your health care provider about what schedule of regular examinations is right for you. What should I know about weight and diet?  Eat a Healthy Diet  Eat plenty of vegetables, fruits, whole grains, low-fat dairy products, and lean protein.  Do not eat a lot of foods high in solid fats, added sugars, or salt. Maintain a Healthy Weight  Regular exercise can help you achieve or maintain a healthy weight. You should:  Do at least 150 minutes of exercise each week. The exercise should increase your heart rate and make you sweat (moderate-intensity exercise).  Do strength-training exercises at least twice a week. Watch Your Levels of Cholesterol and Blood Lipids  Have your blood tested for lipids and cholesterol every 5 years starting at 81 years of age. If you are at high risk for heart disease, you should start having your blood tested when  you are 81 years old. You may need to have your cholesterol levels checked more often if:  Your lipid or cholesterol levels are high.  You are older than 81 years of age.  You are at high risk for heart disease. What should I know about cancer screening? Many types of cancers can be detected early and may often be prevented. Lung Cancer  You should be screened every year for lung cancer if:  You are a current smoker who has smoked for at least 30 years.  You are a former smoker who has quit within the past 15 years.  Talk to your health care provider about your screening options, when you should start screening, and how often you should be screened. Colorectal Cancer  Routine colorectal cancer screening usually begins at 82 years of age and should be repeated every 5-10 years until you  are 81 years old. You may need to be screened more often if early forms of precancerous polyps or small growths are found. Your health care provider may recommend screening at an earlier age if you have risk factors for colon cancer.  Your health care provider may recommend using home test kits to check for hidden blood in the stool.  A small camera at the end of a tube can be used to examine your colon (sigmoidoscopy or colonoscopy). This checks for the earliest forms of colorectal cancer. Prostate and Testicular Cancer  Depending on your age and overall health, your health care provider may do certain tests to screen for prostate and testicular cancer.  Talk to your health care provider about any symptoms or concerns you have about testicular or prostate cancer. Skin Cancer  Check your skin from head to toe regularly.  Tell your health care provider about any new moles or changes in moles, especially if:  There is a change in a mole's size, shape, or color.  You have a mole that is larger than a pencil eraser.  Always use sunscreen. Apply sunscreen liberally and repeat throughout the  day.  Protect yourself by wearing long sleeves, pants, a wide-brimmed hat, and sunglasses when outside. What should I know about heart disease, diabetes, and high blood pressure?  If you are 24-77 years of age, have your blood pressure checked every 3-5 years. If you are 58 years of age or older, have your blood pressure checked every year. You should have your blood pressure measured twice-once when you are at a hospital or clinic, and once when you are not at a hospital or clinic. Record the average of the two measurements. To check your blood pressure when you are not at a hospital or clinic, you can use:  An automated blood pressure machine at a pharmacy.  A home blood pressure monitor.  Talk to your health care provider about your target blood pressure.  If you are between 67-24 years old, ask your health care provider if you should take aspirin to prevent heart disease.  Have regular diabetes screenings by checking your fasting blood sugar level.  If you are at a normal weight and have a low risk for diabetes, have this test once every three years after the age of 76.  If you are overweight and have a high risk for diabetes, consider being tested at a younger age or more often.  A one-time screening for abdominal aortic aneurysm (AAA) by ultrasound is recommended for men aged 79-75 years who are current or former smokers. What should I know about preventing infection? Hepatitis B  If you have a higher risk for hepatitis B, you should be screened for this virus. Talk with your health care provider to find out if you are at risk for hepatitis B infection. Hepatitis C  Blood testing is recommended for:  Everyone born from [birth year] through 1963.  Anyone with known risk factors for hepatitis C. Sexually Transmitted Diseases (STDs)  You should be screened each year for STDs including gonorrhea and chlamydia if:  You are sexually active and are younger than 81 years of age.  You are  older than 81 years of age and your health care provider tells you that you are at risk for this type of infection.  Your sexual activity has changed since you were last screened and you are at an increased risk for chlamydia or gonorrhea. Ask your health care provider if you are  at risk.  Talk with your health care provider about whether you are at high risk of being infected with HIV. Your health care provider may recommend a prescription medicine to help prevent HIV infection. What else can I do?  Schedule regular health, dental, and eye exams.  Stay current with your vaccines (immunizations).  Do not use any tobacco products, such as cigarettes, chewing tobacco, and e-cigarettes. If you need help quitting, ask your health care provider.  Limit alcohol intake to no more than 2 drinks per day. One drink equals 12 ounces of beer, 5 ounces of Samaira Holzworth, or 1 ounces of hard liquor.  Do not use street drugs.  Do not share needles.  Ask your health care provider for help if you need support or information about quitting drugs.  Tell your health care provider if you often feel depressed.  Tell your health care provider if you have ever been abused or do not feel safe at home. This information is not intended to replace advice given to you by your health care provider. Make sure you discuss any questions you have with your health care provider. Document Released: 04/24/2008 Document Revised: 06/25/2016 Document Reviewed: 07/31/2015 Elsevier Interactive Patient Education  2017 [employer].

## 2017-02-02 ENCOUNTER — Telehealth: Payer: Self-pay | Admitting: *Deleted

## 2017-02-02 NOTE — Telephone Encounter (Signed)
Called patient to discuss community resources that would be beneficial for him. Patient did not answer the phone. Left a VM requesting that patient call back.

## 2017-02-03 NOTE — Telephone Encounter (Signed)
Called patient and gave resource information regarding senior services. Discussed with patient that he would be a eligible for [facility] services. Discussed [facility] program with patient who declined at this time.

## 2017-02-04 ENCOUNTER — Telehealth: Payer: Self-pay | Admitting: Internal Medicine

## 2017-02-04 NOTE — Telephone Encounter (Signed)
Pt is aware below msg and is scheduled.

## 2017-02-04 NOTE — Telephone Encounter (Signed)
That is fine with me.

## 2017-02-04 NOTE — Telephone Encounter (Signed)
Fine with me

## 2017-02-04 NOTE — Telephone Encounter (Signed)
Pt would like to transfer from [place] with Dr. Sharlet Salina and would like to establish with Tommi Rumps, NP.  Would this be okay?

## 2017-02-05 ENCOUNTER — Ambulatory Visit (INDEPENDENT_AMBULATORY_CARE_PROVIDER_SITE_OTHER): Payer: Medicare Other | Admitting: Adult Health

## 2017-02-05 ENCOUNTER — Encounter: Payer: Self-pay | Admitting: Adult Health

## 2017-02-05 VITALS — BP 160/82 | Temp 98.3°F | Ht 69.0 in | Wt 137.0 lb

## 2017-02-05 DIAGNOSIS — I10 Essential (primary) hypertension: Secondary | ICD-10-CM | POA: Diagnosis not present

## 2017-02-05 DIAGNOSIS — Z7689 Persons encountering health services in other specified circumstances: Secondary | ICD-10-CM | POA: Diagnosis not present

## 2017-02-05 DIAGNOSIS — H6123 Impacted cerumen, bilateral: Secondary | ICD-10-CM | POA: Diagnosis not present

## 2017-02-05 MED ORDER — AMLODIPINE BESYLATE 5 MG PO TABS: 5.0000 mg | ORAL_TABLET | Freq: Every day | ORAL | 3 refills | Status: DC

## 2017-02-05 NOTE — Progress Notes (Signed)
Patient presents to clinic today to establish care. He is a pleasant 83 year old male who  has a past medical history of Anemia; BPH (benign prostatic hyperplasia); Fasting hyperglycemia; Hyperlipidemia; Hypertension; Other abnormal glucose (2008); Positive PPD (1991); PVD (peripheral vascular disease) ([place]); Pyloric stenosis; and Renal insufficiency.  He is a previous patient of Dr. Sharlet Salina. Last had a check up with her in January 2017   Acute Concerns: Establish Care    Chronic Issues:  Hypertension - He takes Cozaar 100mg  and Amlodipine 5 mg. He has not had a refill of Amlodipine since January 2018. He feels as though his blood pressure is well controlled  Health Maintenance: Dental -- Does not do routine care  Vision -- Does not do routine care  Immunizations -- UTD  Colonoscopy -- No longer needs  Diet: Does not follow a specific diet  Exercise: Does not exercise   Past Medical History:  Diagnosis Date  . Anemia    iron Rxed by Dr Ardis Hughs  . BPH (benign prostatic hyperplasia)   . Fasting hyperglycemia   . Hyperlipidemia   . Hypertension   . Other abnormal glucose 2008   A1c 6.1%  . Positive PPD 1991   no INH prophylaxis due to age  . PVD (peripheral vascular disease) ([place])   . Pyloric stenosis   . Renal insufficiency    Dr Justin Mend    Past Surgical History:  Procedure Laterality Date  . CHOLECYSTECTOMY    . COLONOSCOPY W/ POLYPECTOMY    . LIVER BIOPSY    . UPPER GASTROINTESTINAL ENDOSCOPY  02/2007   Dr. Oretha Caprice; gastritis,incomplete pyloric stricture with partial outlet obstruction    Current Outpatient Prescriptions on File Prior to Visit  Medication Sig Dispense Refill  . amLODipine (NORVASC) 5 MG tablet Take 1 tablet (5 mg total) by mouth daily. 90 tablet 3  . aspirin 81 MG tablet Take 81 mg by mouth daily.      . furosemide (LASIX) 40 MG tablet TAKE ONE TABLET BY MOUTH ONCE DAILY 90 tablet 3  . losartan (COZAAR) 100 MG tablet Take 1 tablet (100 mg  total) by mouth daily. 90 tablet 3  . [DISCONTINUED] olmesartan (BENICAR) 40 MG tablet Take 1 tablet (40 mg total) by mouth daily. 30 tablet 5   No current facility-administered medications on file prior to visit.     No Known Allergies  Family History  Problem Relation Age of Onset  . Stroke Father 74  . Hypertension Mother   . Heart attack Mother 37  . Cancer Maternal Aunt     ? primary  . Alcohol abuse Brother   . Diabetes Neg Hx     Social History   Social History  . Marital status: Divorced    Spouse name: N/A  . Number of children: N/A  . Years of education: N/A   Occupational History  . Retired    Social History Main Topics  . Smoking status: Former Smoker    Quit date: 11/11/1975  . Smokeless tobacco: Never Used     Comment: smoked age [age],up to 1/2 ppd  . Alcohol use No  . Drug use: No  . Sexual activity: No   Other Topics Concern  . Not on file   Social History Narrative   [employer]    Review of Systems  Constitutional: Negative.   HENT: Positive for hearing loss. Negative for ear discharge, ear pain and tinnitus.   Eyes: Negative.  Respiratory: Negative.   Cardiovascular: Negative.   Genitourinary: Negative.   Musculoskeletal: Negative.   Skin: Negative.   Neurological: Negative.   Psychiatric/Behavioral: Negative.   All other systems reviewed and are negative.   BP (!) 150/60 (BP Location: Left Arm, Patient Position: Sitting, Cuff Size: Large)   Temp 98.3 F (36.8 C) (Oral)   Ht 5\' 9"  (1.753 m)   Wt 137 lb (62.1 kg)   BMI 20.23 kg/m   Physical Exam  Constitutional: He is oriented to person, place, and time and well-developed, well-nourished, and in no distress. No distress.  HENT:  Head: Normocephalic and atraumatic.  Right Ear: External ear normal.  Left Ear: External ear normal.  Nose: Nose normal.  Mouth/Throat: Oropharynx is clear and moist. No oropharyngeal exudate.  Cerumen impaction in bilateral ears     Eyes: Conjunctivae and EOM are normal. Pupils are equal, round, and reactive to light. Right eye exhibits no discharge. Left eye exhibits no discharge. No scleral icterus.  Cardiovascular: Normal rate, regular rhythm, normal heart sounds and intact distal pulses.  Exam reveals no gallop and no friction rub.   No murmur heard. Pulmonary/Chest: Effort normal and breath sounds normal. No respiratory distress. He has no wheezes. He has no rales. He exhibits no tenderness.  Neurological: He is alert and oriented to person, place, and time. Gait normal. GCS score is 15.  Skin: Skin is warm and dry. No rash noted. He is not diaphoretic. No erythema. No pallor.  Psychiatric: Mood, memory, affect and judgment normal.  Nursing note and vitals reviewed.    Assessment/Plan: 1. Encounter to establish care - Follow up in one month for annual exam  - Follow up sooner if needed  2. Essential hypertension - amLODipine (NORVASC) 5 MG tablet; Take 1 tablet (5 mg total) by mouth daily.  Dispense: 90 tablet; Refill: 3 - We recheck in one month  3. Bilateral impacted cerumen - Cerumen impaction was easily removed with irrigation. He endorsed feeling as though he could hear better.  - No signs of infection   Dorothyann Peng, NP

## 2017-02-05 NOTE — Patient Instructions (Signed)
It was great meeting you today   I have sent in a prescription for amlodipine. Take this daily   Please follow up with me in one month for your yearly follow up exam

## 2017-02-27 ENCOUNTER — Other Ambulatory Visit: Payer: Self-pay | Admitting: Adult Health

## 2017-02-27 DIAGNOSIS — I779 Disorder of arteries and arterioles, unspecified: Secondary | ICD-10-CM

## 2017-02-27 DIAGNOSIS — I739 Peripheral vascular disease, unspecified: Principal | ICD-10-CM

## 2017-03-02 ENCOUNTER — Encounter (HOSPITAL_COMMUNITY): Payer: Medicare Other

## 2017-03-02 ENCOUNTER — Ambulatory Visit (HOSPITAL_COMMUNITY)
Admission: RE | Admit: 2017-03-02 | Discharge: 2017-03-02 | Disposition: A | Discharge status: Home / Self Care | Payer: Medicare Other | Source: Ambulatory Visit | Attending: Cardiology | Admitting: Cardiology

## 2017-03-02 DIAGNOSIS — E119 Type 2 diabetes mellitus without complications: Secondary | ICD-10-CM | POA: Insufficient documentation

## 2017-03-02 DIAGNOSIS — I739 Peripheral vascular disease, unspecified: Secondary | ICD-10-CM | POA: Insufficient documentation

## 2017-03-02 DIAGNOSIS — I6523 Occlusion and stenosis of bilateral carotid arteries: Secondary | ICD-10-CM | POA: Diagnosis not present

## 2017-03-02 DIAGNOSIS — I10 Essential (primary) hypertension: Secondary | ICD-10-CM | POA: Insufficient documentation

## 2017-03-02 DIAGNOSIS — Z87891 Personal history of nicotine dependence: Secondary | ICD-10-CM | POA: Insufficient documentation

## 2017-03-02 DIAGNOSIS — E785 Hyperlipidemia, unspecified: Secondary | ICD-10-CM | POA: Insufficient documentation

## 2017-03-02 DIAGNOSIS — I779 Disorder of arteries and arterioles, unspecified: Secondary | ICD-10-CM

## 2017-03-11 ENCOUNTER — Encounter: Payer: Self-pay | Admitting: Adult Health

## 2017-03-11 ENCOUNTER — Ambulatory Visit (INDEPENDENT_AMBULATORY_CARE_PROVIDER_SITE_OTHER): Payer: Medicare Other | Admitting: Adult Health

## 2017-03-11 VITALS — BP 142/60 | Temp 98.2°F | Ht 69.0 in | Wt 133.2 lb

## 2017-03-11 DIAGNOSIS — E785 Hyperlipidemia, unspecified: Secondary | ICD-10-CM | POA: Diagnosis not present

## 2017-03-11 DIAGNOSIS — I10 Essential (primary) hypertension: Secondary | ICD-10-CM | POA: Diagnosis not present

## 2017-03-11 LAB — BASIC METABOLIC PANEL
BUN: 44 mg/dL — ABNORMAL HIGH (ref 6–23)
CALCIUM: 9.4 mg/dL (ref 8.4–10.5)
CO2: 27 meq/L (ref 19–32)
Chloride: 106 mEq/L (ref 96–112)
Creatinine, Ser: 2.52 mg/dL — ABNORMAL HIGH (ref 0.40–1.50)
GFR: 31.21 mL/min — ABNORMAL LOW (ref >60.00)
GLUCOSE: 90 mg/dL (ref 70–99)
Potassium: 5.3 mEq/L — ABNORMAL HIGH (ref 3.5–5.1)
SODIUM: 140 meq/L (ref 135–145)

## 2017-03-11 LAB — HEPATIC FUNCTION PANEL
ALBUMIN: 4 g/dL (ref 3.5–5.2)
ALT: 21 U/L (ref 0–53)
AST: 23 U/L (ref 0–37)
Alkaline Phosphatase: 36 U/L — ABNORMAL LOW (ref 39–117)
BILIRUBIN TOTAL: 0.5 mg/dL (ref 0.2–1.2)
Bilirubin, Direct: 0.1 mg/dL (ref 0.0–0.3)
Total Protein: 7 g/dL (ref 6.0–8.3)

## 2017-03-11 LAB — CBC WITH DIFFERENTIAL/PLATELET
BASOS PCT: 0.6 % (ref 0.0–3.0)
Basophils Absolute: 0.1 10*3/uL (ref 0.0–0.1)
EOS ABS: 0.4 10*3/uL (ref 0.0–0.7)
EOS PCT: 4.2 % (ref 0.0–5.0)
HCT: 35 % — ABNORMAL LOW (ref 39.0–52.0)
Hemoglobin: 11.2 g/dL — ABNORMAL LOW (ref 13.0–17.0)
LYMPHS ABS: 2.3 10*3/uL (ref 0.7–4.0)
Lymphocytes Relative: 27.5 % (ref 12.0–46.0)
MCHC: 32.1 g/dL (ref 30.0–36.0)
MCV: 88.7 fl (ref 78.0–100.0)
MONO ABS: 0.9 10*3/uL (ref 0.1–1.0)
Monocytes Relative: 11 % (ref 3.0–12.0)
NEUTROS ABS: 4.8 10*3/uL (ref 1.4–7.7)
Neutrophils Relative %: 56.7 % (ref 43.0–77.0)
PLATELETS: 240 10*3/uL (ref 150.0–400.0)
RBC: 3.94 Mil/uL — ABNORMAL LOW (ref 4.22–5.81)
RDW: 14.6 % (ref 11.5–15.5)
WBC: 8.4 10*3/uL (ref 4.0–10.5)

## 2017-03-11 LAB — LIPID PANEL
CHOLESTEROL: 228 mg/dL — AB (ref 0–200)
HDL: 72.7 mg/dL (ref >39.00)
LDL CALC: 135 mg/dL — AB (ref 0–99)
NonHDL: 155.18
TRIGLYCERIDES: 103 mg/dL (ref 0.0–149.0)
Total CHOL/HDL Ratio: 3
VLDL: 20.6 mg/dL (ref 0.0–40.0)

## 2017-03-11 LAB — TSH: TSH: 6.67 u[IU]/mL — AB (ref 0.35–4.50)

## 2017-03-11 NOTE — Progress Notes (Signed)
Subjective:    Patient ID: Ryan Cortez, male    DOB: 1926-12-12, 83 y.o.   MRN: [id number]  HPI  Patient presents for yearly follow up exam. He is a pleasant and active  83 year old male who  has a past medical history of Anemia; BPH (benign prostatic hyperplasia); Fasting hyperglycemia; Hyperlipidemia; Hypertension; Other abnormal glucose (2008); Positive PPD (1991); PVD (peripheral vascular disease) ([place]); Pyloric stenosis; and Renal insufficiency.   All immunizations and health maintenance protocols were reviewed with the patient and needed orders were placed.  Appropriate screening laboratory values were ordered for the patient including screening of hyperlipidemia, renal function and hepatic function.  Medication reconciliation,  past medical history, social history, problem list and allergies were reviewed in detail with the patient  Goals were established with regard to weight loss, exercise, and  diet in compliance with medications. He does not follow a specific diet and he does not exercise on a regular basis.   End of life planning was discussed. He reports having an advanced directive and living will.   He does not do routine dental or vision screens. He no longer needs to have a colonoscopy done.   Essential hypertension - Controlled with Norvasc 5 mg and Cozaar 100mg   He reports feeling well and has no acute complaints.   He has had a carotid US done within the last month which showed:   Heterogeneous plaque, bilaterally. Stable 1-39% bilateral ICA stenosis. Normal subclavian arteries, bilaterally. Patent vertebral arteries with antegrade flow  Review of Systems  Constitutional: Negative.   Respiratory: Negative.   Cardiovascular: Negative.   Gastrointestinal: Negative.   Genitourinary: Negative.   Musculoskeletal: Positive for arthralgias.  Skin: Negative.   Neurological: Negative.   Psychiatric/Behavioral: Negative.   All other systems reviewed and are  negative.  Past Medical History:  Diagnosis Date  . Anemia    iron Rxed by Dr Ardis Hughs  . BPH (benign prostatic hyperplasia)   . Fasting hyperglycemia   . Hyperlipidemia   . Hypertension   . Other abnormal glucose 2008   A1c 6.1%  . Positive PPD 1991   no INH prophylaxis due to age  . PVD (peripheral vascular disease) ([place])   . Pyloric stenosis   . Renal insufficiency    Dr Justin Mend    Social History   Social History  . Marital status: Divorced    Spouse name: N/A  . Number of children: N/A  . Years of education: N/A   Occupational History  . Retired    Social History Main Topics  . Smoking status: Former Smoker    Quit date: 11/11/1975  . Smokeless tobacco: Never Used     Comment: smoked age [age],up to 1/2 ppd  . Alcohol use No  . Drug use: No  . Sexual activity: No   Other Topics Concern  . Not on file   Social History Narrative   [employer]    Past Surgical History:  Procedure Laterality Date  . CHOLECYSTECTOMY    . COLONOSCOPY W/ POLYPECTOMY    . LIVER BIOPSY    . UPPER GASTROINTESTINAL ENDOSCOPY  02/2007   Dr. Oretha Caprice; gastritis,incomplete pyloric stricture with partial outlet obstruction    Family History  Problem Relation Age of Onset  . Stroke Father 52  . Hypertension Mother   . Heart attack Mother 66  . Cancer Maternal Aunt     ? primary  . Alcohol abuse Brother   . Diabetes Neg Hx  No Known Allergies  Current Outpatient Prescriptions on File Prior to Visit  Medication Sig Dispense Refill  . amLODipine (NORVASC) 5 MG tablet Take 1 tablet (5 mg total) by mouth daily. 90 tablet 3  . aspirin 81 MG tablet Take 81 mg by mouth daily.      . furosemide (LASIX) 40 MG tablet TAKE ONE TABLET BY MOUTH ONCE DAILY 90 tablet 3  . losartan (COZAAR) 100 MG tablet Take 1 tablet (100 mg total) by mouth daily. 90 tablet 3  . [DISCONTINUED] olmesartan (BENICAR) 40 MG tablet Take 1 tablet (40 mg total) by mouth daily. 30 tablet 5   No  current facility-administered medications on file prior to visit.     BP (!) 142/60 (BP Location: Left Arm, Patient Position: Sitting, Cuff Size: Normal)   Temp 98.2 F (36.8 C) (Oral)   Ht 5\' 9"  (1.753 m)   Wt 133 lb 3.2 oz (60.4 kg)   BMI 19.67 kg/m       Objective:   Physical Exam  Constitutional: He is oriented to person, place, and time. He appears well-developed and well-nourished. No distress.  Thin but well nourished    HENT:  Head: Normocephalic and atraumatic.  Right Ear: External ear normal.  Left Ear: External ear normal.  Nose: Nose normal.  Mouth/Throat: Oropharynx is clear and moist. No oropharyngeal exudate.  Eyes: Conjunctivae and EOM are normal. Pupils are equal, round, and reactive to light. Right eye exhibits no discharge. Left eye exhibits no discharge. No scleral icterus.  Neck: Normal range of motion. Neck supple. No thyromegaly present.  Cardiovascular: Normal rate, regular rhythm, normal heart sounds and intact distal pulses.  Exam reveals no gallop and no friction rub.   No murmur heard. Pulmonary/Chest: Effort normal and breath sounds normal. No respiratory distress. He has no wheezes. He has no rales. He exhibits no tenderness.  Abdominal: Soft. Bowel sounds are normal. He exhibits no distension. There is no tenderness. There is no rebound and no guarding.  Genitourinary:  Genitourinary Comments: Deferred due to age    Lymphadenopathy:    He has no cervical adenopathy.  Neurological: He is alert and oriented to person, place, and time. He has normal reflexes. He displays normal reflexes. No cranial nerve deficit. He exhibits normal muscle tone. Coordination normal.  Skin: Skin is warm and dry. No rash noted. He is not diaphoretic. No erythema. No pallor.  Psychiatric: He has a normal mood and affect. His behavior is normal. Judgment and thought content normal.  Nursing note and vitals reviewed.     Assessment & Plan:  1. Essential hypertension -  Controlled with current therapy  - Follow up in one year or sooner if needed - Continue to be active  - Basic metabolic panel - CBC with Differential/Platelet - TSH - Hepatic function panel - Lipid panel  2. Hyperlipidemia, unspecified hyperlipidemia type - Basic metabolic panel - CBC with Differential/Platelet - TSH - Hepatic function panel - Lipid panel - Due to age, it is highly unlikely I will place on statin. He wanted to know what his cholesterol levels were  Dorothyann Peng, NP

## 2017-04-07 ENCOUNTER — Encounter: Payer: Self-pay | Admitting: Adult Health

## 2017-04-07 ENCOUNTER — Ambulatory Visit (INDEPENDENT_AMBULATORY_CARE_PROVIDER_SITE_OTHER): Payer: Medicare Other | Admitting: Adult Health

## 2017-04-07 VITALS — BP 142/50 | Temp 98.5°F | Ht 69.0 in | Wt 132.4 lb

## 2017-04-07 DIAGNOSIS — I10 Essential (primary) hypertension: Secondary | ICD-10-CM

## 2017-04-07 DIAGNOSIS — R197 Diarrhea, unspecified: Secondary | ICD-10-CM | POA: Diagnosis not present

## 2017-04-07 MED ORDER — LOSARTAN POTASSIUM 100 MG PO TABS: 100.0000 mg | ORAL_TABLET | Freq: Every day | ORAL | 3 refills | Status: DC

## 2017-04-07 NOTE — Patient Instructions (Addendum)
I would like you to take 1/2 tab of Imodium after every episode of diarrhea   Eat a bland diet   Please follow up with me on Friday    Bland Diet A bland diet consists of foods that do not have a lot of fat or fiber. Foods without fat or fiber are easier for the body to digest. They are also less likely to irritate your mouth, throat, stomach, and other parts of your gastrointestinal tract. A bland diet is sometimes called a BRAT diet. What is my plan? Your health care provider or dietitian may recommend specific changes to your diet to prevent and treat your symptoms, such as:  Eating small meals often.  Cooking food until it is soft enough to chew easily.  Chewing your food well.  Drinking fluids slowly.  Not eating foods that are very spicy, sour, or fatty.  Not eating citrus fruits, such as oranges and grapefruit. What do I need to know about this diet?  Eat a variety of foods from the bland diet food list.  Do not follow a bland diet longer than you have to.  Ask your health care provider whether you should take vitamins. What foods can I eat? Grains   Hot cereals, such as cream of wheat. Bread, crackers, or tortillas made from refined white flour. Rice. Vegetables  Canned or cooked vegetables. Mashed or boiled potatoes. Fruits  Bananas. Applesauce. Other types of cooked or canned fruit with the skin and seeds removed, such as canned peaches or pears. Meats and Other Protein Sources  Scrambled eggs. Creamy peanut butter or other nut butters. Lean, well-cooked meats, such as chicken or fish. Tofu. Soups or broths. Dairy  Low-fat dairy products, such as milk, cottage cheese, or yogurt. Beverages  Water. Herbal tea. Apple juice. Sweets and Desserts  Pudding. Custard. Fruit gelatin. Ice cream. Fats and Oils  Mild salad dressings. Canola or olive oil. The items listed above may not be a complete list of allowed foods or beverages. Contact your dietitian for more  options.  What foods are not recommended? Foods and ingredients that are often not recommended include:  Spicy foods, such as hot sauce or salsa.  Fried foods.  Sour foods, such as pickled or fermented foods.  Raw vegetables or fruits, especially citrus or berries.  Caffeinated drinks.  Alcohol.  Strongly flavored seasonings or condiments. The items listed above may not be a complete list of foods and beverages that are not allowed. Contact your dietitian for more information.  This information is not intended to replace advice given to you by your health care provider. Make sure you discuss any questions you have with your health care provider. Document Released: 02/18/2016 Document Revised: 04/03/2016 Document Reviewed: 11/08/2014 Elsevier Interactive Patient Education  2017 [employer].

## 2017-04-07 NOTE — Progress Notes (Signed)
Subjective:    Patient ID: Ryan Cortez, male    DOB: 24-Sep-1927, 83 y.o.   MRN: [id number]  HPI  83 year old male who  has a past medical history of Anemia; BPH (benign prostatic hyperplasia); Fasting hyperglycemia; Hyperlipidemia; Hypertension; Other abnormal glucose (2008); Positive PPD (1991); PVD (peripheral vascular disease) ([place]); Pyloric stenosis; and Renal insufficiency. He presents to the office today for the acute complaint of diarrhea x one week. The last episode of diarrhea was one day ago. He denies any recent antibiotic use. He has not been having any abdominal pain  He denies any nausea or vomiting and has not noticed any blood in his stool   Does not feel acutely ill.    Review of Systems  Constitutional: Negative.   Respiratory: Negative.   Cardiovascular: Negative.   Gastrointestinal: Positive for diarrhea. Negative for abdominal distention, abdominal pain, anal bleeding, blood in stool, nausea and rectal pain.  Musculoskeletal: Negative.   All other systems reviewed and are negative.  Past Medical History:  Diagnosis Date  . Anemia    iron Rxed by Dr Ardis Hughs  . BPH (benign prostatic hyperplasia)   . Fasting hyperglycemia   . Hyperlipidemia   . Hypertension   . Other abnormal glucose 2008   A1c 6.1%  . Positive PPD 1991   no INH prophylaxis due to age  . PVD (peripheral vascular disease) ([place])   . Pyloric stenosis   . Renal insufficiency    Dr Justin Mend    Social History   Social History  . Marital status: Divorced    Spouse name: N/A  . Number of children: N/A  . Years of education: N/A   Occupational History  . Retired    Social History Main Topics  . Smoking status: Former Smoker    Quit date: 11/11/1975  . Smokeless tobacco: Never Used     Comment: smoked age [age],up to 1/2 ppd  . Alcohol use No  . Drug use: No  . Sexual activity: No   Other Topics Concern  . Not on file   Social History Narrative   [employer]    Past  Surgical History:  Procedure Laterality Date  . CHOLECYSTECTOMY    . COLONOSCOPY W/ POLYPECTOMY    . LIVER BIOPSY    . UPPER GASTROINTESTINAL ENDOSCOPY  02/2007   Dr. Oretha Caprice; gastritis,incomplete pyloric stricture with partial outlet obstruction    Family History  Problem Relation Age of Onset  . Stroke Father 41  . Hypertension Mother   . Heart attack Mother 105  . Cancer Maternal Aunt        ? primary  . Alcohol abuse Brother   . Diabetes Neg Hx     No Known Allergies  Current Outpatient Prescriptions on File Prior to Visit  Medication Sig Dispense Refill  . amLODipine (NORVASC) 5 MG tablet Take 1 tablet (5 mg total) by mouth daily. 90 tablet 3  . aspirin 81 MG tablet Take 81 mg by mouth daily.      . furosemide (LASIX) 40 MG tablet TAKE ONE TABLET BY MOUTH ONCE DAILY 90 tablet 3  . [DISCONTINUED] olmesartan (BENICAR) 40 MG tablet Take 1 tablet (40 mg total) by mouth daily. 30 tablet 5   No current facility-administered medications on file prior to visit.     BP (!) 142/50 (BP Location: Left Arm, Patient Position: Sitting, Cuff Size: Normal)   Temp 98.5 F (36.9 C) (Oral)   Ht 5\' 9"  (  1.753 m)   Wt 132 lb 6.4 oz (60.1 kg)   BMI 19.55 kg/m       Objective:   Physical Exam  Constitutional: He is oriented to person, place, and time. He appears well-developed and well-nourished. No distress.  Cardiovascular: Normal rate, regular rhythm, normal heart sounds and intact distal pulses.  Exam reveals no gallop and no friction rub.   No murmur heard. Pulmonary/Chest: Effort normal and breath sounds normal. No respiratory distress. He has no wheezes. He has no rales. He exhibits no tenderness.  Abdominal: Soft. Normal appearance and bowel sounds are normal. He exhibits no distension and no mass. There is no hepatosplenomegaly, splenomegaly or hepatomegaly. There is no tenderness. There is no rebound and no guarding. No hernia.  Musculoskeletal: Normal range of motion. He  exhibits no edema, tenderness or deformity.  Neurological: He is alert and oriented to person, place, and time.  Skin: Skin is warm and dry. No rash noted. He is not diaphoretic. No erythema. No pallor.  Psychiatric: He has a normal mood and affect. Judgment and thought content normal.  Nursing note and vitals reviewed.      Assessment & Plan:  1. Diarrhea, unspecified type - Does not appear to be infectious  - Will have him eat a bland diet and take 1/2 tab of imodium with each episode of diarrhea  - Follow up in 3 days - Stay hydrated  2. Essential hypertension - med refill  - losartan (COZAAR) 100 MG tablet; Take 1 tablet (100 mg total) by mouth daily.  Dispense: 90 tablet; Refill: 3  Dorothyann Peng, NP

## 2017-04-09 ENCOUNTER — Telehealth: Payer: Self-pay

## 2017-04-09 NOTE — Telephone Encounter (Signed)
Patient called to report that he is feeling much better. He denies any continued diarrhea episodes. He wanted to make sure and let you know. Nothing further needed at this time.  [place]. Thanks!

## 2017-07-27 ENCOUNTER — Other Ambulatory Visit: Payer: Self-pay | Admitting: Family Medicine

## 2017-07-27 NOTE — Telephone Encounter (Signed)
Cory, I do not see that you have prescribed this in the past.  Please advise.  Thanks!!

## 2017-07-28 MED ORDER — FUROSEMIDE 40 MG PO TABS: ORAL_TABLET | ORAL | 3 refills | Status: DC

## 2017-07-28 NOTE — Telephone Encounter (Signed)
Ok to refill for one year  

## 2017-07-28 NOTE — Telephone Encounter (Signed)
Sent to the pharmacy by e-scribe. 

## 2017-09-29 ENCOUNTER — Ambulatory Visit (INDEPENDENT_AMBULATORY_CARE_PROVIDER_SITE_OTHER): Payer: Medicare Other | Admitting: Adult Health

## 2017-09-29 ENCOUNTER — Encounter: Payer: Self-pay | Admitting: Adult Health

## 2017-09-29 VITALS — BP 166/70 | Temp 98.4°F | Wt 139.0 lb

## 2017-09-29 DIAGNOSIS — R600 Localized edema: Secondary | ICD-10-CM | POA: Diagnosis not present

## 2017-09-29 LAB — BASIC METABOLIC PANEL
BUN: 28 mg/dL — ABNORMAL HIGH (ref 6–23)
CALCIUM: 9.6 mg/dL (ref 8.4–10.5)
CO2: 27 meq/L (ref 19–32)
Chloride: 104 mEq/L (ref 96–112)
Creatinine, Ser: 2.4 mg/dL — ABNORMAL HIGH (ref 0.40–1.50)
GFR: 32.98 mL/min — AB (ref >60.00)
Glucose, Bld: 88 mg/dL (ref 70–99)
Potassium: 4.7 mEq/L (ref 3.5–5.1)
SODIUM: 138 meq/L (ref 135–145)

## 2017-09-29 MED ORDER — FUROSEMIDE 40 MG PO TABS: ORAL_TABLET | ORAL | 3 refills | Status: DC

## 2017-09-29 NOTE — Progress Notes (Signed)
Subjective:    Patient ID: Ryan Cortez, male    DOB: 1927/09/22, 83 y.o.   MRN: [id number]  HPI  83 year old male who  has a past medical history of Anemia, BPH (benign prostatic hyperplasia), Fasting hyperglycemia, Hyperlipidemia, Hypertension, Other abnormal glucose (2008), Positive PPD (1991), PVD (peripheral vascular disease) ([place]), Pyloric stenosis, and Renal insufficiency. He presents to the office today for the acute complaint of lower extremity edema. He reports that he has been out of lasix for about two months. Endorses worsening swelling throughout the day. Swelling usually resolves over night. Denies any CP or SOB  Review of Systems See HPI   Past Medical History:  Diagnosis Date  . Anemia    iron Rxed by Dr Ardis Hughs  . BPH (benign prostatic hyperplasia)   . Fasting hyperglycemia   . Hyperlipidemia   . Hypertension   . Other abnormal glucose 2008   A1c 6.1%  . Positive PPD 1991   no INH prophylaxis due to age  . PVD (peripheral vascular disease) ([place])   . Pyloric stenosis   . Renal insufficiency    Dr Justin Mend    Social History   Socioeconomic History  . Marital status: Divorced    Spouse name: Not on file  . Number of children: Not on file  . Years of education: Not on file  . Highest education level: Not on file  Social Needs  . Financial resource strain: Not on file  . Food insecurity - worry: Not on file  . Food insecurity - inability: Not on file  . Transportation needs - medical: Not on file  . Transportation needs - non-medical: Not on file  Occupational History  . Occupation: Retired  Tobacco Use  . Smoking status: Former Smoker    Last attempt to quit: 11/11/1975    Years since quitting: 41.9  . Smokeless tobacco: Never Used  . Tobacco comment: smoked age [age],up to 1/2 ppd  Substance and Sexual Activity  . Alcohol use: No  . Drug use: No  . Sexual activity: No  Other Topics Concern  . Not on file  Social History Narrative   [employer]    Past Surgical History:  Procedure Laterality Date  . CHOLECYSTECTOMY    . COLONOSCOPY W/ POLYPECTOMY    . LIVER BIOPSY    . UPPER GASTROINTESTINAL ENDOSCOPY  02/2007   Dr. Oretha Caprice; gastritis,incomplete pyloric stricture with partial outlet obstruction    Family History  Problem Relation Age of Onset  . Stroke Father 106  . Hypertension Mother   . Heart attack Mother 26  . Cancer Maternal Aunt        ? primary  . Alcohol abuse Brother   . Diabetes Neg Hx     No Known Allergies  Current Outpatient Medications on File Prior to Visit  Medication Sig Dispense Refill  . amLODipine (NORVASC) 5 MG tablet Take 1 tablet (5 mg total) by mouth daily. 90 tablet 3  . aspirin 81 MG tablet Take 81 mg by mouth daily.      Marland Kitchen losartan (COZAAR) 100 MG tablet Take 1 tablet (100 mg total) by mouth daily. 90 tablet 3  . furosemide (LASIX) 40 MG tablet TAKE ONE TABLET BY MOUTH ONCE DAILY (Patient not taking: Reported on 09/29/2017) 90 tablet 3  . [DISCONTINUED] olmesartan (BENICAR) 40 MG tablet Take 1 tablet (40 mg total) by mouth daily. 30 tablet 5   No current facility-administered medications on file prior  to visit.     BP (!) 166/70 (BP Location: Left Arm)   Temp 98.4 F (36.9 C) (Oral)   Wt 139 lb (63 kg)   BMI 20.53 kg/m       Objective:   Physical Exam  Constitutional: He is oriented to person, place, and time. He appears well-developed and well-nourished. No distress.  Cardiovascular: Normal rate, regular rhythm, normal heart sounds and intact distal pulses. Exam reveals no gallop and no friction rub.  No murmur heard. Pulmonary/Chest: Effort normal and breath sounds normal. No respiratory distress. He has no wheezes. He has no rales. He exhibits no tenderness.  Musculoskeletal: He exhibits edema (+2 pitting edema R>L).  Neurological: He is alert and oriented to person, place, and time.  Skin: Skin is warm and dry. No rash noted. He is not diaphoretic. No  erythema. No pallor.  Psychiatric: He has a normal mood and affect. His behavior is normal. Judgment and thought content normal.  Nursing note and vitals reviewed.     Assessment & Plan:  1. Lower extremity edema  - furosemide (LASIX) 40 MG tablet; TAKE ONE TABLET BY MOUTH ONCE DAILY  Dispense: 90 tablet; Refill: 3 - Basic Metabolic Panel  Dorothyann Peng, NP

## 2017-11-04 ENCOUNTER — Telehealth: Payer: Self-pay | Admitting: Family Medicine

## 2017-11-04 NOTE — Telephone Encounter (Signed)
Tried calling X 2.  Received a busy signal each time.  Will try again at a later time.

## 2017-11-04 NOTE — Telephone Encounter (Signed)
Labs were stable.

## 2017-11-04 NOTE — Telephone Encounter (Signed)
Copied from [place] [id number]. Topic: General - Other >> Nov 04, 2017 10:17 AM Lolita Rieger, RMA wrote: Reason for CRM: pt called for lab results

## 2017-11-05 NOTE — Telephone Encounter (Signed)
Left a message for a return call.

## 2017-11-06 NOTE — Telephone Encounter (Signed)
Pt. Given lab results. Verbalizes understanding. 

## 2017-12-08 ENCOUNTER — Ambulatory Visit (INDEPENDENT_AMBULATORY_CARE_PROVIDER_SITE_OTHER): Payer: Medicare Other | Admitting: Adult Health

## 2017-12-08 ENCOUNTER — Encounter: Payer: Self-pay | Admitting: Adult Health

## 2017-12-08 VITALS — BP 150/56 | Temp 98.4°F | Wt 141.0 lb

## 2017-12-08 DIAGNOSIS — R600 Localized edema: Secondary | ICD-10-CM

## 2017-12-08 NOTE — Progress Notes (Signed)
Subjective:    Patient ID: Ryan Cortez, male    DOB: 11/05/1927, 84 y.o.   MRN: [id number]  HPI  84 year old male who  has a past medical history of Anemia, BPH (benign prostatic hyperplasia), Fasting hyperglycemia, Hyperlipidemia, Hypertension, Other abnormal glucose (2008), Positive PPD (1991), PVD (peripheral vascular disease) ([place]), Pyloric stenosis, and Renal insufficiency. He presents to the office today for follow up regarding lower extremity edema. During his last visit he had not had any Lasix for about two months. At this time he had + 2 pitting edema in bilateral lower extremities.   Today he reports " I think I take my lasix every day and my legs are less swollen. They do continue to swell during the day though."    Review of Systems See HPI   Past Medical History:  Diagnosis Date  . Anemia    iron Rxed by Dr Ardis Hughs  . BPH (benign prostatic hyperplasia)   . Fasting hyperglycemia   . Hyperlipidemia   . Hypertension   . Other abnormal glucose 2008   A1c 6.1%  . Positive PPD 1991   no INH prophylaxis due to age  . PVD (peripheral vascular disease) ([place])   . Pyloric stenosis   . Renal insufficiency    Dr Justin Mend    Social History   Socioeconomic History  . Marital status: Divorced    Spouse name: Not on file  . Number of children: Not on file  . Years of education: Not on file  . Highest education level: Not on file  Social Needs  . Financial resource strain: Not on file  . Food insecurity - worry: Not on file  . Food insecurity - inability: Not on file  . Transportation needs - medical: Not on file  . Transportation needs - non-medical: Not on file  Occupational History  . Occupation: Retired  Tobacco Use  . Smoking status: Former Smoker    Last attempt to quit: 11/11/1975    Years since quitting: 42.1  . Smokeless tobacco: Never Used  . Tobacco comment: smoked age [age],up to 1/2 ppd  Substance and Sexual Activity  . Alcohol use: No  . Drug use: No  .  Sexual activity: No  Other Topics Concern  . Not on file  Social History Narrative   [employer]    Past Surgical History:  Procedure Laterality Date  . CHOLECYSTECTOMY    . COLONOSCOPY W/ POLYPECTOMY    . LIVER BIOPSY    . UPPER GASTROINTESTINAL ENDOSCOPY  02/2007   Dr. Oretha Caprice; gastritis,incomplete pyloric stricture with partial outlet obstruction    Family History  Problem Relation Age of Onset  . Stroke Father 88  . Hypertension Mother   . Heart attack Mother 17  . Cancer Maternal Aunt        ? primary  . Alcohol abuse Brother   . Diabetes Neg Hx     No Known Allergies  Current Outpatient Medications on File Prior to Visit  Medication Sig Dispense Refill  . amLODipine (NORVASC) 5 MG tablet Take 1 tablet (5 mg total) by mouth daily. 90 tablet 3  . aspirin 81 MG tablet Take 81 mg by mouth daily.      . furosemide (LASIX) 40 MG tablet TAKE ONE TABLET BY MOUTH ONCE DAILY 90 tablet 3  . losartan (COZAAR) 100 MG tablet Take 1 tablet (100 mg total) by mouth daily. 90 tablet 3  . [DISCONTINUED] olmesartan (BENICAR) 40 MG  tablet Take 1 tablet (40 mg total) by mouth daily. 30 tablet 5   No current facility-administered medications on file prior to visit.     BP (!) 150/56 (BP Location: Left Arm)   Temp 98.4 F (36.9 C) (Oral)   Wt 141 lb (64 kg)   BMI 20.82 kg/m       Objective:   Physical Exam  Constitutional: He is oriented to person, place, and time. He appears well-developed and well-nourished. No distress.  Cardiovascular: Normal rate, regular rhythm, normal heart sounds and intact distal pulses. Exam reveals no gallop and no friction rub.  No murmur heard. Pulmonary/Chest: Effort normal and breath sounds normal. No respiratory distress. He has no wheezes. He has no rales. He exhibits no tenderness.  Musculoskeletal: Normal range of motion. He exhibits edema. He exhibits no tenderness or deformity.  Continues to have trace pitting edema in  the lower extremities. Has improved.   Neurological: He is alert and oriented to person, place, and time.  Skin: Skin is warm and dry. No rash noted. No erythema. No pallor.  Vitals reviewed.      Assessment & Plan:  1. Lower extremity edema - Elevate legs above heart at night and when resting during the day  - Make sure you are taking Lasix daily  - Ryan get compression socks  - Basic Metabolic Panel  Dorothyann Peng, NP

## 2017-12-08 NOTE — Patient Instructions (Addendum)
Your swelling has improved. I want you to make sure you are taking the Lasix every day.   You can go to [facility] - [address] Get a pair or two of knee high compression socks.

## 2017-12-09 LAB — BASIC METABOLIC PANEL
BUN: 31 mg/dL — ABNORMAL HIGH (ref 6–23)
CO2: 26 meq/L (ref 19–32)
Calcium: 9.4 mg/dL (ref 8.4–10.5)
Chloride: 107 mEq/L (ref 96–112)
Creatinine, Ser: 2.6 mg/dL — ABNORMAL HIGH (ref 0.40–1.50)
GFR: 30.05 mL/min — AB (ref >60.00)
GLUCOSE: 96 mg/dL (ref 70–99)
POTASSIUM: 5.7 meq/L — AB (ref 3.5–5.1)
SODIUM: 142 meq/L (ref 135–145)

## 2017-12-10 ENCOUNTER — Other Ambulatory Visit: Payer: Self-pay | Admitting: Adult Health

## 2017-12-10 DIAGNOSIS — E875 Hyperkalemia: Secondary | ICD-10-CM

## 2017-12-18 ENCOUNTER — Other Ambulatory Visit (INDEPENDENT_AMBULATORY_CARE_PROVIDER_SITE_OTHER): Payer: Medicare Other

## 2017-12-18 DIAGNOSIS — E875 Hyperkalemia: Secondary | ICD-10-CM | POA: Diagnosis not present

## 2017-12-18 LAB — BASIC METABOLIC PANEL WITH GFR
BUN: 37 mg/dL — ABNORMAL HIGH (ref 6–23)
CO2: 27 meq/L (ref 19–32)
Calcium: 9 mg/dL (ref 8.4–10.5)
Chloride: 105 meq/L (ref 96–112)
Creatinine, Ser: 2.45 mg/dL — ABNORMAL HIGH (ref 0.40–1.50)
GFR: 32.19 mL/min — ABNORMAL LOW
Glucose, Bld: 88 mg/dL (ref 70–99)
Potassium: 4.5 meq/L (ref 3.5–5.1)
Sodium: 138 meq/L (ref 135–145)

## 2017-12-22 ENCOUNTER — Encounter: Payer: Self-pay | Admitting: Family Medicine

## 2018-01-18 DIAGNOSIS — N189 Chronic kidney disease, unspecified: Secondary | ICD-10-CM | POA: Diagnosis not present

## 2018-01-18 DIAGNOSIS — M1990 Unspecified osteoarthritis, unspecified site: Secondary | ICD-10-CM | POA: Diagnosis not present

## 2018-01-18 DIAGNOSIS — N2581 Secondary hyperparathyroidism of renal origin: Secondary | ICD-10-CM | POA: Diagnosis not present

## 2018-01-18 DIAGNOSIS — D631 Anemia in chronic kidney disease: Secondary | ICD-10-CM | POA: Diagnosis not present

## 2018-01-18 DIAGNOSIS — N184 Chronic kidney disease, stage 4 (severe): Secondary | ICD-10-CM | POA: Diagnosis not present

## 2018-01-18 DIAGNOSIS — R7302 Impaired glucose tolerance (oral): Secondary | ICD-10-CM | POA: Diagnosis not present

## 2018-01-18 DIAGNOSIS — I129 Hypertensive chronic kidney disease with stage 1 through stage 4 chronic kidney disease, or unspecified chronic kidney disease: Secondary | ICD-10-CM | POA: Diagnosis not present

## 2018-01-18 DIAGNOSIS — R809 Proteinuria, unspecified: Secondary | ICD-10-CM | POA: Diagnosis not present

## 2018-01-29 ENCOUNTER — Encounter: Payer: Self-pay | Admitting: Family Medicine

## 2018-02-03 DIAGNOSIS — I129 Hypertensive chronic kidney disease with stage 1 through stage 4 chronic kidney disease, or unspecified chronic kidney disease: Secondary | ICD-10-CM | POA: Diagnosis not present

## 2018-04-26 ENCOUNTER — Ambulatory Visit: Payer: Self-pay | Admitting: *Deleted

## 2018-04-26 NOTE — Telephone Encounter (Signed)
Patient called in with c/o "leg swelling." He says "I saw Tommi Rumps in March and my legs were swelling. He told me to get some compression stockings. I got them and tried to wear them, but they were so tight it made it hard to breathe. So, I bought some white diabetes socks to wear and the swelling was still there. My lower legs only swell during the day and not at night. They are not red, painful, but have some dark spots on them." I asked about his breathing, chest pain, he says "my breathing is fine and my chest doesn't hurt." According to protocol, see PCP within 24 hours, appointment scheduled for tomorrow at 1500 with Dorothyann Peng, AGNP-C, care advice given, patient verbalized understanding.   Reason for Disposition . [1] MODERATE leg swelling (e.g., swelling extends up to knees) AND [2] new onset or worsening  Answer Assessment - Initial Assessment Questions 1. ONSET: "When did the swelling start?" (e.g., minutes, hours, days)     March 2. LOCATION: "What part of the leg is swollen?"  "Are both legs swollen or just one leg?"     Both legs, knees down 3. SEVERITY: "How bad is the swelling?" (e.g., localized; mild, moderate, severe)  - Localized - small area of swelling localized to one leg  - MILD pedal edema - swelling limited to foot and ankle, pitting edema < 1/4 inch (6 mm) deep, rest and elevation eliminate most or all swelling  - MODERATE edema - swelling of lower leg to knee, pitting edema > 1/4 inch (6 mm) deep, rest and elevation only partially reduce swelling  - SEVERE edema - swelling extends above knee, facial or hand swelling present      Moderate 4. REDNESS: "Does the swelling look red or infected?"     No 5. PAIN: "Is the swelling painful to touch?" If so, ask: "How painful is it?"   (Scale 1-10; mild, moderate or severe)     No 6. FEVER: "Do you have a fever?" If so, ask: "What is it, how was it measured, and when did it start?"      No 7. CAUSE: "What do you think is causing  the leg swelling?"     I don't know 8. MEDICAL HISTORY: "Do you have a history of heart failure, kidney disease, liver failure, or cancer?"     Kidney disease, not on hemodialysis 9. RECURRENT SYMPTOM: "Have you had leg swelling before?" If so, ask: "When was the last time?" "What happened that time?"     Yes 10. OTHER SYMPTOMS: "Do you have any other symptoms?" (e.g., chest pain, difficulty breathing)     No 11. PREGNANCY: "Is there any chance you are pregnant?" "When was your last menstrual period?"       N/A  Protocols used: LEG SWELLING AND EDEMA-A-AH

## 2018-04-26 NOTE — Telephone Encounter (Signed)
TC. Left VM for patient to call back if he would like to speak with an Therapist, sports.

## 2018-04-27 ENCOUNTER — Ambulatory Visit (INDEPENDENT_AMBULATORY_CARE_PROVIDER_SITE_OTHER): Payer: Medicare Other | Admitting: Adult Health

## 2018-04-27 ENCOUNTER — Encounter: Payer: Self-pay | Admitting: Adult Health

## 2018-04-27 VITALS — BP 158/44 | Temp 98.6°F | Wt 126.0 lb

## 2018-04-27 DIAGNOSIS — R600 Localized edema: Secondary | ICD-10-CM | POA: Diagnosis not present

## 2018-04-27 DIAGNOSIS — R634 Abnormal weight loss: Secondary | ICD-10-CM

## 2018-04-27 NOTE — Patient Instructions (Addendum)
Please follow up with me in one month. I want you to eat four small meals a day

## 2018-04-27 NOTE — Progress Notes (Signed)
Subjective:    Patient ID: Ryan Cortez, male    DOB: [birth year], 84 y.o.   MRN: [id number]  HPI 84 year old male who  has a past medical history of Anemia, BPH (benign prostatic hyperplasia), Fasting hyperglycemia, Hyperlipidemia, Hypertension, Other abnormal glucose (2008), Positive PPD (1991), PVD (peripheral vascular disease) ([place]), Pyloric stenosis, and Renal insufficiency.  He presents to the office today for the chronic complaint of lower extremity swelling. He is currently prescribed Lasix 40 mg daily. Report that his legs " get puffy" throughout the day and then when he lays down in bed the swelling resolves. He is taking his Lasix as directed.   He is also concerned about weight loss. He reports " I just forget to eat throughout the day. I will get busy doing something and I forget." Denies poor appetite, feeling ill, nausea, vomiting, or diarrhea.   Wt Readings from Last 3 Encounters:  04/27/18 126 lb (57.2 kg)  12/08/17 141 lb (64 kg)  09/29/17 139 lb (63 kg)    Review of Systems See HPI   Past Medical History:  Diagnosis Date  . Anemia    iron Rxed by Dr Ardis Hughs  . BPH (benign prostatic hyperplasia)   . Fasting hyperglycemia   . Hyperlipidemia   . Hypertension   . Other abnormal glucose 2008   A1c 6.1%  . Positive PPD 1991   no INH prophylaxis due to age  . PVD (peripheral vascular disease) ([place])   . Pyloric stenosis   . Renal insufficiency    Dr Justin Mend    Social History   Socioeconomic History  . Marital status: Divorced    Spouse name: Not on file  . Number of children: Not on file  . Years of education: Not on file  . Highest education level: Not on file  Occupational History  . Occupation: Retired  Scientific laboratory technician  . Financial resource strain: Not on file  . Food insecurity:    Worry: Not on file    Inability: Not on file  . Transportation needs:    Medical: Not on file    Non-medical: Not on file  Tobacco Use  . Smoking status: Former Smoker   Last attempt to quit: 11/11/1975    Years since quitting: 42.4  . Smokeless tobacco: Never Used  . Tobacco comment: smoked age [age],up to 1/2 ppd  Substance and Sexual Activity  . Alcohol use: No  . Drug use: No  . Sexual activity: Never  Lifestyle  . Physical activity:    Days per week: Not on file    Minutes per session: Not on file  . Stress: Not on file  Relationships  . Social connections:    Talks on phone: Not on file    Gets together: Not on file    Attends religious service: Not on file    Active member of club or organization: Not on file    Attends meetings of clubs or organizations: Not on file    Relationship status: Not on file  . Intimate partner violence:    Fear of current or ex partner: Not on file    Emotionally abused: Not on file    Physically abused: Not on file    Forced sexual activity: Not on file  Other Topics Concern  . Not on file  Social History Narrative   [employer]    Past Surgical History:  Procedure Laterality Date  . CHOLECYSTECTOMY    . COLONOSCOPY W/  POLYPECTOMY    . LIVER BIOPSY    . UPPER GASTROINTESTINAL ENDOSCOPY  02/2007   Dr. Oretha Caprice; gastritis,incomplete pyloric stricture with partial outlet obstruction    Family History  Problem Relation Age of Onset  . Stroke Father 52  . Hypertension Mother   . Heart attack Mother 65  . Cancer Maternal Aunt        ? primary  . Alcohol abuse Brother   . Diabetes Neg Hx     No Known Allergies  Current Outpatient Medications on File Prior to Visit  Medication Sig Dispense Refill  . amLODipine (NORVASC) 10 MG tablet Take 10 mg by mouth daily.    Marland Kitchen aspirin 81 MG tablet Take 81 mg by mouth daily.      . furosemide (LASIX) 40 MG tablet TAKE ONE TABLET BY MOUTH ONCE DAILY 90 tablet 3  . losartan (COZAAR) 100 MG tablet Take 1 tablet (100 mg total) by mouth daily. 90 tablet 3   No current facility-administered medications on file prior to visit.     BP (!) 158/44    Temp 98.6 F (37 C) (Oral)   Wt 126 lb (57.2 kg)   BMI 18.61 kg/m       Objective:   Physical Exam  Constitutional: He is oriented to person, place, and time. He appears well-developed and well-nourished. No distress.  Cardiovascular: Normal rate, regular rhythm, normal heart sounds and intact distal pulses. Exam reveals no gallop and no friction rub.  No murmur heard. Pulmonary/Chest: Effort normal and breath sounds normal. No stridor. No respiratory distress. He has no wheezes. He has no rales. He exhibits no tenderness.  Abdominal: Soft. Bowel sounds are normal. He exhibits no distension and no mass. There is no tenderness. There is no rebound and no guarding. No hernia.  Musculoskeletal: He exhibits edema (trace non pitting edema in bilateral lower extremities ). He exhibits no tenderness or deformity.  Neurological: He is alert and oriented to person, place, and time.  Skin: Skin is warm and dry. Capillary refill takes less than 2 seconds. He is not diaphoretic.  Psychiatric: He has a normal mood and affect. His behavior is normal. Judgment and thought content normal.  Nursing note and vitals reviewed.     Assessment & Plan:  1. Bilateral lower extremity edema - Advised to elevate legs above heart when at rest. No need for medication change at this time   2. Weight loss - I would like him to plan four small meals throughout the day  - Follow up in one month or sooner if needed  Dorothyann Peng, NP

## 2018-09-13 ENCOUNTER — Ambulatory Visit: Payer: Self-pay

## 2018-09-13 NOTE — Telephone Encounter (Signed)
Patient called in with c/o "blood in urine." He says "I went to urinate last night and today and I saw blood in my urine. I collected it in an empty medicine bottle to see when I bring it to the office. The urine was red with a clot. It starts out with the blood, then clears up to regular color. I don't have pain, no fever. Sometimes it takes a while to start, but it comes on out." I asked about urinating more than usual, he says "well, I think so. I get up about 2 times during the night." According to protocol, see PCP within 24 hours, no availability with PCP, appointment scheduled for tomorrow at 1420 with Dr. Elease Hashimoto, care advice given, patient verbalized understanding. Advised to place urine in the refrigerator and bring in tomorrow, he verbalized understanding.   Reason for Disposition . Blood in urine  (Exception: could be normal menstrual bleeding)  Answer Assessment - Initial Assessment Questions 1. COLOR of URINE: "Describe the color of the urine."  (e.g., tea-colored, pink, red, blood clots, bloody)     Red with clots 2. ONSET: "When did the bleeding start?"      Last night 3. EPISODES: "How many times has there been blood in the urine?" or "How many times today?"     Twice  4. PAIN with URINATION: "Is there any pain with passing your urine?" If so, ask: "How bad is the pain?"  (Scale 1-10; or mild, moderate, severe)    - MILD - complains slightly about urination hurting    - MODERATE - interferes with normal activities      - SEVERE - excruciating, unwilling or unable to urinate because of the pain      No 5. FEVER: "Do you have a fever?" If so, ask: "What is your temperature, how was it measured, and when did it start?"     No 6. ASSOCIATED SYMPTOMS: "Are you passing urine more frequently than usual?"     Yes, sometimes 7. OTHER SYMPTOMS: "Do you have any other symptoms?" (e.g., back/flank pain, abdominal pain, vomiting)     No 8. PREGNANCY: "Is there any chance you are  pregnant?" "When was your last menstrual period?"     N/A  Protocols used: URINE - BLOOD IN-A-AH

## 2018-09-14 ENCOUNTER — Encounter: Payer: Self-pay | Admitting: Family Medicine

## 2018-09-14 ENCOUNTER — Ambulatory Visit (INDEPENDENT_AMBULATORY_CARE_PROVIDER_SITE_OTHER): Payer: Medicare Other | Admitting: Family Medicine

## 2018-09-14 ENCOUNTER — Other Ambulatory Visit: Payer: Self-pay

## 2018-09-14 VITALS — BP 134/70 | HR 89 | Temp 98.1°F | Wt 129.7 lb

## 2018-09-14 DIAGNOSIS — R319 Hematuria, unspecified: Secondary | ICD-10-CM

## 2018-09-14 LAB — POCT URINALYSIS DIPSTICK
BILIRUBIN UA: NEGATIVE
Blood, UA: POSITIVE
Glucose, UA: NEGATIVE
Ketones, UA: NEGATIVE
Leukocytes, UA: NEGATIVE
Nitrite, UA: NEGATIVE
Protein, UA: POSITIVE — AB
Spec Grav, UA: 1.02 (ref 1.010–1.025)
UROBILINOGEN UA: 0.2 U/dL
pH, UA: 6 (ref 5.0–8.0)

## 2018-09-14 LAB — CBC WITH DIFFERENTIAL/PLATELET
Basophils Absolute: 0 10*3/uL (ref 0.0–0.1)
Basophils Relative: 0.6 % (ref 0.0–3.0)
EOS PCT: 1.5 % (ref 0.0–5.0)
Eosinophils Absolute: 0.1 10*3/uL (ref 0.0–0.7)
HEMATOCRIT: 33.2 % — AB (ref 39.0–52.0)
HEMOGLOBIN: 10.7 g/dL — AB (ref 13.0–17.0)
LYMPHS PCT: 18.6 % (ref 12.0–46.0)
Lymphs Abs: 1.4 10*3/uL (ref 0.7–4.0)
MCHC: 32.3 g/dL (ref 30.0–36.0)
MCV: 88 fl (ref 78.0–100.0)
MONO ABS: 0.9 10*3/uL (ref 0.1–1.0)
MONOS PCT: 12.1 % — AB (ref 3.0–12.0)
Neutro Abs: 4.9 10*3/uL (ref 1.4–7.7)
Neutrophils Relative %: 67.2 % (ref 43.0–77.0)
Platelets: 202 10*3/uL (ref 150.0–400.0)
RBC: 3.77 Mil/uL — AB (ref 4.22–5.81)
RDW: 14 % (ref 11.5–15.5)
WBC: 7.3 10*3/uL (ref 4.0–10.5)

## 2018-09-14 LAB — URINALYSIS, MICROSCOPIC ONLY

## 2018-09-14 NOTE — Addendum Note (Signed)
Addended by: Eulas Post on: 09/14/2018 06:02 PM   Modules accepted: Orders

## 2018-09-14 NOTE — Patient Instructions (Signed)
Hematuria, Adult Hematuria is blood in your urine. It can be caused by a bladder infection, kidney infection, prostate infection, kidney stone, or cancer of your urinary tract. Infections can usually be treated with medicine, and a kidney stone usually will pass through your urine. If neither of these is the cause of your hematuria, further workup to find out the reason may be needed. It is very important that you tell your health care provider about any blood you see in your urine, even if the blood stops without treatment or happens without causing pain. Blood in your urine that happens and then stops and then happens again can be a symptom of a very serious condition. Also, pain is not a symptom in the initial stages of many urinary cancers. Follow these instructions at home:  Drink lots of fluid, 3-4 quarts a day. If you have been diagnosed with an infection, cranberry juice is especially recommended, in addition to large amounts of water.  Avoid caffeine, tea, and carbonated beverages because they tend to irritate the bladder.  Avoid alcohol because it may irritate the prostate.  Take all medicines as directed by your health care provider.  If you were prescribed an antibiotic medicine, finish it all even if you start to feel better.  If you have been diagnosed with a kidney stone, follow your health care provider's instructions regarding straining your urine to catch the stone.  Empty your bladder often. Avoid holding urine for long periods of time.  After a bowel movement, women should cleanse front to back. Use each tissue only once.  Empty your bladder before and after sexual intercourse if you are a male. Contact a health care provider if:  You develop back pain.  You have a fever.  You have a feeling of sickness in your stomach (nausea) or vomiting.  Your symptoms are not better in 3 days. Return sooner if you are getting worse. Get help right away if:  You develop  severe vomiting and are unable to keep the medicine down.  You develop severe back or abdominal pain despite taking your medicines.  You begin passing a large amount of blood or clots in your urine.  You feel extremely weak or faint, or you pass out. This information is not intended to replace advice given to you by your health care provider. Make sure you discuss any questions you have with your health care provider. Document Released: 10/27/2005 Document Revised: 04/03/2016 Document Reviewed: 06/27/2013 Elsevier Interactive Patient Education  2017 [place] your aspirin for now.  Follow up for any fever or recurrent blood in urine  We will likely need to set up Urology referral.

## 2018-09-14 NOTE — Progress Notes (Signed)
  Subjective:     Patient ID: Ryan Cortez, male   DOB: [birth year], 84 y.o.   MRN: [id number]  HPI Patient is seen as a work in with episode yesterday of gross hematuria.  He denies any past history of hematuria.  He had only a small amount of blood since then.  No burning with urination.  No flank pain.  No history of kidney stones.  No abdominal pain.  Denies any general bruising or bleeding issues.  No recent fever or chills.  No back pain.  Does take baby aspirin daily.  He has hypertension treated with losartan and amlodipine.  His chronic problems include history of BPH, hypertension, chronic kidney disease, chronic anemia  He relates poor appetite in general but weight is actually very stable.  Past Medical History:  Diagnosis Date  . Anemia    iron Rxed by Dr Ardis Hughs  . BPH (benign prostatic hyperplasia)   . Fasting hyperglycemia   . Hyperlipidemia   . Hypertension   . Other abnormal glucose 2008   A1c 6.1%  . Positive PPD 1991   no INH prophylaxis due to age  . PVD (peripheral vascular disease) ([place])   . Pyloric stenosis   . Renal insufficiency    Dr Justin Mend   Past Surgical History:  Procedure Laterality Date  . CHOLECYSTECTOMY    . COLONOSCOPY W/ POLYPECTOMY    . LIVER BIOPSY    . UPPER GASTROINTESTINAL ENDOSCOPY  02/2007   Dr. Oretha Caprice; gastritis,incomplete pyloric stricture with partial outlet obstruction    reports that he quit smoking about 42 years ago. He has never used smokeless tobacco. He reports that he does not drink alcohol or use drugs. family history includes Alcohol abuse in his brother; Cancer in his maternal aunt; Heart attack (age of onset: 27) in his mother; Hypertension in his mother; Stroke (age of onset: 52) in his father. No Known Allergies   Review of Systems  Constitutional: Negative for appetite change, chills, fever and unexpected weight change.  Gastrointestinal: Negative for abdominal pain.  Genitourinary: Positive for hematuria. Negative  for decreased urine volume, dysuria, flank pain, penile pain and testicular pain.  Musculoskeletal: Negative for back pain.  Neurological: Negative for dizziness.       Objective:   Physical Exam  Constitutional: He appears well-developed and well-nourished.  Cardiovascular: Normal rate and regular rhythm.  Pulmonary/Chest: Effort normal and breath sounds normal.  Abdominal: Soft. He exhibits no mass. There is no tenderness. There is no rebound and no guarding.  Musculoskeletal: He exhibits no edema.       Assessment:     Patient presents with one episode yesterday of painless gross hematuria.  Obviously, at his age concern is for possible malignancy.  He has no known history of kidney stones and no evidence for infection on dipstick.    Plan:     -Send urine for culture, though suspect this will not show any infection.  We will also check urine microscopy -Check CBC -Hold aspirin for now.  He has no history of CAD or cerebrovascular disease -We will likely need urology referral but will wait on above tests first  Eulas Post MD [facility]

## 2018-09-14 NOTE — Addendum Note (Signed)
Addended by: Elmer Picker on: 09/14/2018 02:58 PM   Modules accepted: Orders

## 2018-09-14 NOTE — Addendum Note (Signed)
Addended by: Elmer Picker on: 09/14/2018 02:59 PM   Modules accepted: Orders

## 2018-09-15 LAB — URINE CULTURE
MICRO NUMBER:: 91330653
SPECIMEN QUALITY:: ADEQUATE

## 2018-10-26 DIAGNOSIS — R310 Gross hematuria: Secondary | ICD-10-CM | POA: Diagnosis not present

## 2018-10-26 DIAGNOSIS — N402 Nodular prostate without lower urinary tract symptoms: Secondary | ICD-10-CM | POA: Diagnosis not present

## 2018-11-05 DIAGNOSIS — R310 Gross hematuria: Secondary | ICD-10-CM | POA: Diagnosis not present

## 2018-12-07 DIAGNOSIS — R310 Gross hematuria: Secondary | ICD-10-CM | POA: Diagnosis not present

## 2018-12-07 DIAGNOSIS — R3914 Feeling of incomplete bladder emptying: Secondary | ICD-10-CM | POA: Diagnosis not present

## 2018-12-07 DIAGNOSIS — N402 Nodular prostate without lower urinary tract symptoms: Secondary | ICD-10-CM | POA: Diagnosis not present

## 2018-12-07 DIAGNOSIS — N401 Enlarged prostate with lower urinary tract symptoms: Secondary | ICD-10-CM | POA: Diagnosis not present

## 2018-12-15 ENCOUNTER — Encounter: Payer: Self-pay | Admitting: Adult Health

## 2018-12-15 ENCOUNTER — Telehealth: Payer: Self-pay | Admitting: Adult Health

## 2018-12-15 ENCOUNTER — Ambulatory Visit (INDEPENDENT_AMBULATORY_CARE_PROVIDER_SITE_OTHER): Payer: Medicare Other | Admitting: Adult Health

## 2018-12-15 VITALS — BP 156/64 | Temp 98.3°F | Wt 124.0 lb

## 2018-12-15 DIAGNOSIS — H9193 Unspecified hearing loss, bilateral: Secondary | ICD-10-CM | POA: Diagnosis not present

## 2018-12-15 DIAGNOSIS — R600 Localized edema: Secondary | ICD-10-CM

## 2018-12-15 DIAGNOSIS — I10 Essential (primary) hypertension: Secondary | ICD-10-CM

## 2018-12-15 NOTE — Progress Notes (Signed)
Subjective:    Patient ID: Ryan Cortez, male    DOB: Sep 09, 1927, 85 y.o.   MRN: [id number]  HPI  85 year old male who  has a past medical history of Anemia, BPH (benign prostatic hyperplasia), Fasting hyperglycemia, Hyperlipidemia, Hypertension, Other abnormal glucose (2008), Positive PPD (1991), PVD (peripheral vascular disease) ([place]), Pyloric stenosis, and Renal insufficiency.  He presents with his daughter today   He would like to be referred to ENT for hearing aids.   He would also like to transfer to the [place] office to be closer to his home   Review of Systems See HPI   Past Medical History:  Diagnosis Date  . Anemia    iron Rxed by Dr Ardis Hughs  . BPH (benign prostatic hyperplasia)   . Fasting hyperglycemia   . Hyperlipidemia   . Hypertension   . Other abnormal glucose 2008   A1c 6.1%  . Positive PPD 1991   no INH prophylaxis due to age  . PVD (peripheral vascular disease) ([place])   . Pyloric stenosis   . Renal insufficiency    Dr Justin Mend    Social History   Socioeconomic History  . Marital status: Divorced    Spouse name: Not on file  . Number of children: Not on file  . Years of education: Not on file  . Highest education level: Not on file  Occupational History  . Occupation: Retired  Scientific laboratory technician  . Financial resource strain: Not on file  . Food insecurity:    Worry: Not on file    Inability: Not on file  . Transportation needs:    Medical: Not on file    Non-medical: Not on file  Tobacco Use  . Smoking status: Former Smoker    Last attempt to quit: 11/11/1975    Years since quitting: 43.1  . Smokeless tobacco: Never Used  . Tobacco comment: smoked age [age],up to 1/2 ppd  Substance and Sexual Activity  . Alcohol use: No  . Drug use: No  . Sexual activity: Never  Lifestyle  . Physical activity:    Days per week: Not on file    Minutes per session: Not on file  . Stress: Not on file  Relationships  . Social connections:    Talks on phone: Not on  file    Gets together: Not on file    Attends religious service: Not on file    Active member of club or organization: Not on file    Attends meetings of clubs or organizations: Not on file    Relationship status: Not on file  . Intimate partner violence:    Fear of current or ex partner: Not on file    Emotionally abused: Not on file    Physically abused: Not on file    Forced sexual activity: Not on file  Other Topics Concern  . Not on file  Social History Narrative   [employer]    Past Surgical History:  Procedure Laterality Date  . CHOLECYSTECTOMY    . COLONOSCOPY W/ POLYPECTOMY    . LIVER BIOPSY    . UPPER GASTROINTESTINAL ENDOSCOPY  02/2007   Dr. Oretha Caprice; gastritis,incomplete pyloric stricture with partial outlet obstruction    Family History  Problem Relation Age of Onset  . Stroke Father 21  . Hypertension Mother   . Heart attack Mother 39  . Cancer Maternal Aunt        ? primary  . Alcohol abuse Brother   .  Diabetes Neg Hx     No Known Allergies  Current Outpatient Medications on File Prior to Visit  Medication Sig Dispense Refill  . amLODipine (NORVASC) 10 MG tablet Take 10 mg by mouth daily.    . furosemide (LASIX) 40 MG tablet TAKE ONE TABLET BY MOUTH ONCE DAILY 90 tablet 3  . losartan (COZAAR) 100 MG tablet Take 1 tablet (100 mg total) by mouth daily. 90 tablet 3   No current facility-administered medications on file prior to visit.     BP (!) 156/64   Temp 98.3 F (36.8 C)   Wt 124 lb (56.2 kg)   BMI 18.31 kg/m       Objective:   Physical Exam Vitals signs and nursing note reviewed.  Constitutional:      Appearance: Normal appearance.  HENT:     Right Ear: Tympanic membrane, ear canal and external ear normal. There is no impacted cerumen.     Left Ear: Tympanic membrane, ear canal and external ear normal. There is no impacted cerumen.  Cardiovascular:     Rate and Rhythm: Normal rate and regular rhythm.  Pulmonary:       Effort: Pulmonary effort is normal.     Breath sounds: Normal breath sounds.  Skin:    General: Skin is warm and dry.     Capillary Refill: Capillary refill takes less than 2 seconds.  Neurological:     General: No focal deficit present.     Mental Status: He is alert.  Psychiatric:        Mood and Affect: Mood normal.        Behavior: Behavior normal.        Thought Content: Thought content normal.        Judgment: Judgment normal.        Assessment & Plan:  1. Bilateral hearing loss, unspecified hearing loss type - Ambulatory referral to ENT   Advised to stop at the check out desk to have them refer to [facility] to a provider that is taking new patients  Dorothyann Peng, NP

## 2018-12-15 NOTE — Telephone Encounter (Signed)
Ryan Cortez-  Patient is requesting to transfer to [place] office.

## 2018-12-15 NOTE — Telephone Encounter (Signed)
Patient needs all of his prescriptions refilled.  He forgot to ask for them during his visit.  Pharmacy: [facility]

## 2018-12-15 NOTE — Telephone Encounter (Signed)
Ok with me 

## 2018-12-16 MED ORDER — FUROSEMIDE 40 MG PO TABS: ORAL_TABLET | ORAL | 0 refills | Status: DC

## 2018-12-16 MED ORDER — AMLODIPINE BESYLATE 10 MG PO TABS: 10.0000 mg | ORAL_TABLET | Freq: Every day | ORAL | 0 refills | Status: DC

## 2018-12-16 MED ORDER — LOSARTAN POTASSIUM 100 MG PO TABS: 100.0000 mg | ORAL_TABLET | Freq: Every day | ORAL | 0 refills | Status: DC

## 2018-12-16 NOTE — Telephone Encounter (Signed)
Sent to the pharmacy by e-scribe. 

## 2018-12-16 NOTE — Telephone Encounter (Signed)
Ryan Cortez, is pt taking amlodipine?  Unfortunately, couldn't get a straight forward answer from the pt while in the office.  According to refill hx.  I do not believe the pt to be on this but wanted to make sure.

## 2018-12-16 NOTE — Telephone Encounter (Signed)
He should be taking it

## 2018-12-17 NOTE — Telephone Encounter (Signed)
Left patient vm to call office back to schedule transfer to [facility] or [place].

## 2018-12-23 DIAGNOSIS — I129 Hypertensive chronic kidney disease with stage 1 through stage 4 chronic kidney disease, or unspecified chronic kidney disease: Secondary | ICD-10-CM | POA: Diagnosis not present

## 2018-12-23 DIAGNOSIS — N2581 Secondary hyperparathyroidism of renal origin: Secondary | ICD-10-CM | POA: Diagnosis not present

## 2018-12-23 DIAGNOSIS — D631 Anemia in chronic kidney disease: Secondary | ICD-10-CM | POA: Diagnosis not present

## 2018-12-23 DIAGNOSIS — R7302 Impaired glucose tolerance (oral): Secondary | ICD-10-CM | POA: Diagnosis not present

## 2018-12-23 DIAGNOSIS — N184 Chronic kidney disease, stage 4 (severe): Secondary | ICD-10-CM | POA: Diagnosis not present

## 2018-12-23 DIAGNOSIS — I739 Peripheral vascular disease, unspecified: Secondary | ICD-10-CM | POA: Diagnosis not present

## 2018-12-23 DIAGNOSIS — N189 Chronic kidney disease, unspecified: Secondary | ICD-10-CM | POA: Diagnosis not present

## 2019-01-04 DIAGNOSIS — H903 Sensorineural hearing loss, bilateral: Secondary | ICD-10-CM | POA: Diagnosis not present

## 2019-02-08 ENCOUNTER — Telehealth: Payer: Self-pay

## 2019-02-08 NOTE — Telephone Encounter (Addendum)
Author phoned pt. to assess interest in scheduling virtual awv. No answer, author left detailed VM asking for return call.   

## 2019-03-03 DIAGNOSIS — N401 Enlarged prostate with lower urinary tract symptoms: Secondary | ICD-10-CM | POA: Diagnosis not present

## 2019-03-03 DIAGNOSIS — N402 Nodular prostate without lower urinary tract symptoms: Secondary | ICD-10-CM | POA: Diagnosis not present

## 2019-03-03 DIAGNOSIS — R3914 Feeling of incomplete bladder emptying: Secondary | ICD-10-CM | POA: Diagnosis not present

## 2019-03-07 ENCOUNTER — Telehealth: Payer: Self-pay | Admitting: *Deleted

## 2019-03-07 NOTE — Telephone Encounter (Signed)
Copied from [place] [id number]. Topic: Appointment Scheduling - Prior Auth Required for Appointment >> Mar 07, 2019  2:51 PM Nils Flack, Marland Kitchen wrote: No appointment has been scheduled. Patient is requesting transfer of care from [facility] to Dr Gerrit Friends Cherylann Banas) appointment. Per scheduling protocol, this appointment requires a prior authorization prior to scheduling. Pt is wanting to change because he is requesting a MD  Please call [phone]   Route to department's PEC pool.

## 2019-03-08 NOTE — Telephone Encounter (Signed)
Pt would like TOC. Okay for TOC per Tommi Rumps

## 2019-03-08 NOTE — Telephone Encounter (Signed)
Ok to transfer care. I wish him the best of luck

## 2019-03-08 NOTE — Telephone Encounter (Signed)
Okay by me to transfer.

## 2019-03-08 NOTE — Telephone Encounter (Signed)
Okay to schedule patient, thanks!

## 2019-03-09 NOTE — Telephone Encounter (Signed)
I called and left message on patient voicemail, transfer has been approved and can call the office to schedule appointment at [facility] - with Dr. Ethelene Hal.

## 2019-04-11 ENCOUNTER — Encounter: Payer: Medicare Other | Admitting: Family Medicine

## 2019-04-19 DIAGNOSIS — I129 Hypertensive chronic kidney disease with stage 1 through stage 4 chronic kidney disease, or unspecified chronic kidney disease: Secondary | ICD-10-CM | POA: Diagnosis not present

## 2019-04-19 DIAGNOSIS — N2581 Secondary hyperparathyroidism of renal origin: Secondary | ICD-10-CM | POA: Diagnosis not present

## 2019-04-19 DIAGNOSIS — N189 Chronic kidney disease, unspecified: Secondary | ICD-10-CM | POA: Diagnosis not present

## 2019-04-19 DIAGNOSIS — D631 Anemia in chronic kidney disease: Secondary | ICD-10-CM | POA: Diagnosis not present

## 2019-04-19 DIAGNOSIS — R7302 Impaired glucose tolerance (oral): Secondary | ICD-10-CM | POA: Diagnosis not present

## 2019-04-19 DIAGNOSIS — N184 Chronic kidney disease, stage 4 (severe): Secondary | ICD-10-CM | POA: Diagnosis not present

## 2019-04-19 DIAGNOSIS — I739 Peripheral vascular disease, unspecified: Secondary | ICD-10-CM | POA: Diagnosis not present

## 2019-05-09 ENCOUNTER — Telehealth: Payer: Self-pay | Admitting: Behavioral Health

## 2019-05-09 NOTE — Telephone Encounter (Signed)
Unable to reach patient at time of call. Left message for patient to return call when available to complete prescreen; also informed patient to call [phone] upon arrival to the appointment.

## 2019-05-10 ENCOUNTER — Ambulatory Visit (INDEPENDENT_AMBULATORY_CARE_PROVIDER_SITE_OTHER): Payer: Medicare Other | Admitting: Family Medicine

## 2019-05-10 ENCOUNTER — Encounter: Payer: Self-pay | Admitting: Family Medicine

## 2019-05-10 VITALS — BP 158/80 | HR 92 | Ht 69.0 in | Wt 121.0 lb

## 2019-05-10 DIAGNOSIS — D649 Anemia, unspecified: Secondary | ICD-10-CM | POA: Diagnosis not present

## 2019-05-10 DIAGNOSIS — R600 Localized edema: Secondary | ICD-10-CM | POA: Diagnosis not present

## 2019-05-10 DIAGNOSIS — E538 Deficiency of other specified B group vitamins: Secondary | ICD-10-CM | POA: Diagnosis not present

## 2019-05-10 DIAGNOSIS — R7989 Other specified abnormal findings of blood chemistry: Secondary | ICD-10-CM

## 2019-05-10 DIAGNOSIS — I10 Essential (primary) hypertension: Secondary | ICD-10-CM | POA: Diagnosis not present

## 2019-05-10 MED ORDER — LOSARTAN POTASSIUM 100 MG PO TABS: 100.0000 mg | ORAL_TABLET | Freq: Every day | ORAL | 0 refills | Status: DC

## 2019-05-10 MED ORDER — AMLODIPINE BESYLATE 10 MG PO TABS: 10.0000 mg | ORAL_TABLET | Freq: Every day | ORAL | 0 refills | Status: DC

## 2019-05-10 MED ORDER — FUROSEMIDE 40 MG PO TABS: ORAL_TABLET | ORAL | 0 refills | Status: DC

## 2019-05-10 NOTE — Progress Notes (Signed)
Established Patient Office Visit  Subjective:  Patient ID: Ryan Cortez, male    DOB: May 25, 1927  Age: 85 y.o. MRN: [id number]  CC:  Chief Complaint  Patient presents with  . Establish Care    HPI Ryan Cortez presents for establishment of care by way of transfer.  Patient has a history of hypertension and chronic kidney disease.  Just saw nephrology a month ago.  He has been out of his blood pressure medicines for a week now.  Patient has been taking iron sporadically.  He had been concerned that it turned his stool growing.  Denies blood in his stool or melena.  Urine flow is good he reports.  Patient lives alone and is still driving.  He does some of his own cooking and cares for the house.  Children who live close by helping him with his bills.  Past Medical History:  Diagnosis Date  . Anemia    iron Rxed by Dr Ardis Hughs  . BPH (benign prostatic hyperplasia)   . Fasting hyperglycemia   . Hyperlipidemia   . Hypertension   . Other abnormal glucose 2008   A1c 6.1%  . Positive PPD 1991   no INH prophylaxis due to age  . PVD (peripheral vascular disease) ([place])   . Pyloric stenosis   . Renal insufficiency    Dr Justin Mend    Past Surgical History:  Procedure Laterality Date  . CHOLECYSTECTOMY    . COLONOSCOPY W/ POLYPECTOMY    . LIVER BIOPSY    . UPPER GASTROINTESTINAL ENDOSCOPY  02/2007   Dr. Oretha Caprice; gastritis,incomplete pyloric stricture with partial outlet obstruction    Family History  Problem Relation Age of Onset  . Stroke Father 51  . Hypertension Mother   . Heart attack Mother 61  . Cancer Maternal Aunt        ? primary  . Alcohol abuse Brother   . Diabetes Neg Hx     Social History   Socioeconomic History  . Marital status: Divorced    Spouse name: Not on file  . Number of children: Not on file  . Years of education: Not on file  . Highest education level: Not on file  Occupational History  . Occupation: Retired  Scientific laboratory technician  . Financial resource  strain: Not on file  . Food insecurity    Worry: Not on file    Inability: Not on file  . Transportation needs    Medical: Not on file    Non-medical: Not on file  Tobacco Use  . Smoking status: Former Smoker    Quit date: 11/11/1975    Years since quitting: 43.5  . Smokeless tobacco: Never Used  . Tobacco comment: smoked age [age],up to 1/2 ppd  Substance and Sexual Activity  . Alcohol use: No  . Drug use: No  . Sexual activity: Never  Lifestyle  . Physical activity    Days per week: Not on file    Minutes per session: Not on file  . Stress: Not on file  Relationships  . Social Herbalist on phone: Not on file    Gets together: Not on file    Attends religious service: Not on file    Active member of club or organization: Not on file    Attends meetings of clubs or organizations: Not on file    Relationship status: Not on file  . Intimate partner violence    Fear of current or ex partner: Not  on file    Emotionally abused: Not on file    Physically abused: Not on file    Forced sexual activity: Not on file  Other Topics Concern  . Not on file  Social History Narrative   [employer]    Outpatient Medications Prior to Visit  Medication Sig Dispense Refill  . amLODipine (NORVASC) 10 MG tablet Take 1 tablet (10 mg total) by mouth daily. 90 tablet 0  . furosemide (LASIX) 40 MG tablet TAKE ONE TABLET BY MOUTH ONCE DAILY 90 tablet 0  . losartan (COZAAR) 100 MG tablet Take 1 tablet (100 mg total) by mouth daily. 90 tablet 0   No facility-administered medications prior to visit.     No Known Allergies  ROS Review of Systems  Constitutional: Negative for chills, diaphoresis, fatigue, fever and unexpected weight change.  HENT: Positive for hearing loss.   Eyes: Negative for photophobia and visual disturbance.  Respiratory: Negative for shortness of breath and wheezing.   Cardiovascular: Negative for chest pain, palpitations and leg swelling.   Gastrointestinal: Negative.   Endocrine: Negative for polyphagia and polyuria.  Genitourinary: Negative for difficulty urinating, frequency and urgency.  Skin: Negative for wound.  Allergic/Immunologic: Negative for immunocompromised state.  Neurological: Negative for light-headedness and headaches.  Hematological: Does not bruise/bleed easily.  Psychiatric/Behavioral: Negative.       Objective:    Physical Exam  Constitutional: He is oriented to person, place, and time. He appears well-developed and well-nourished. No distress.  HENT:  Head: Normocephalic and atraumatic.  Right Ear: External ear normal.  Left Ear: External ear normal.  Mouth/Throat: Oropharynx is clear and moist. No oropharyngeal exudate.  Eyes: Pupils are equal, round, and reactive to light. Conjunctivae are normal. Right eye exhibits no discharge. Left eye exhibits no discharge. No scleral icterus.  Neck: No JVD present. No tracheal deviation present. No thyromegaly present.  Cardiovascular: Normal rate, regular rhythm and normal heart sounds.  Extrasystoles are present.  Pulmonary/Chest: Effort normal and breath sounds normal. No stridor.  Abdominal: Soft. Bowel sounds are normal. He exhibits no distension. There is no abdominal tenderness. There is no rebound.  Musculoskeletal:        General: No edema.  Lymphadenopathy:    He has no cervical adenopathy.  Neurological: He is alert and oriented to person, place, and time.  Skin: Skin is warm and dry. He is not diaphoretic.  Psychiatric: He has a normal mood and affect. His behavior is normal.    BP (!) 158/80   Pulse 92   Ht 5\' 9"  (1.753 m)   Wt 121 lb (54.9 kg)   SpO2 99%   BMI 17.87 kg/m  Wt Readings from Last 3 Encounters:  05/10/19 121 lb (54.9 kg)  12/15/18 124 lb (56.2 kg)  09/14/18 129 lb 11.2 oz (58.8 kg)   BP Readings from Last 3 Encounters:  05/10/19 (!) 158/80  12/15/18 (!) 156/64  09/14/18 134/70   Guideline developer:  UpToDate  (see UpToDate for funding source) Date Released: June 2014  Health Maintenance Due  Topic Date Due  . Samul Dada  02/[birth year]/1948  . PNA vac Low Risk Adult (1 of 2 - PCV13) 02/26/1994    There are no preventive care reminders to display for this patient.  Lab Results  Component Value Date   TSH 6.67 (H) 03/11/2017   Lab Results  Component Value Date   WBC 7.3 09/14/2018   HGB 10.7 (L) 09/14/2018   HCT 33.2 (L) 09/14/2018  MCV 88.0 09/14/2018   PLT 202.0 09/14/2018   Lab Results  Component Value Date   NA 138 12/18/2017   K 4.5 12/18/2017   CO2 27 12/18/2017   GLUCOSE 88 12/18/2017   BUN 37 (H) 12/18/2017   CREATININE 2.45 (H) 12/18/2017   BILITOT 0.5 03/11/2017   ALKPHOS 36 (L) 03/11/2017   AST 23 03/11/2017   ALT 21 03/11/2017   PROT 7.0 03/11/2017   ALBUMIN 4.0 03/11/2017   CALCIUM 9.0 12/18/2017   GFR 32.19 (L) 12/18/2017   Lab Results  Component Value Date   CHOL 228 (H) 03/11/2017   Lab Results  Component Value Date   HDL 72.70 03/11/2017   Lab Results  Component Value Date   LDLCALC 135 (H) 03/11/2017   Lab Results  Component Value Date   TRIG 103.0 03/11/2017   Lab Results  Component Value Date   CHOLHDL 3 03/11/2017   Lab Results  Component Value Date   HGBA1C 6.1 04/05/2014      Assessment & Plan:   Problem List Items Addressed This Visit      Cardiovascular and Mediastinum   Essential hypertension - Primary   Relevant Medications   amLODipine (NORVASC) 10 MG tablet   furosemide (LASIX) 40 MG tablet   losartan (COZAAR) 100 MG tablet   Other Relevant Orders   Basic metabolic panel   Urinalysis, Routine w reflex microscopic     Other   Anemia   Relevant Orders   CBC   Iron, TIBC and Ferritin Panel   TSH   Urinalysis, Routine w reflex microscopic   Vitamin B 12 deficiency   Relevant Orders   CBC   Vitamin B12    Other Visit Diagnoses    Lower extremity edema       Relevant Medications   furosemide (LASIX) 40 MG tablet    Elevated TSH       Abnormal TSH          Meds ordered this encounter  Medications  . amLODipine (NORVASC) 10 MG tablet    Sig: Take 1 tablet (10 mg total) by mouth daily.    Dispense:  90 tablet    Refill:  0  . furosemide (LASIX) 40 MG tablet    Sig: TAKE ONE TABLET BY MOUTH ONCE DAILY    Dispense:  90 tablet    Refill:  0  . losartan (COZAAR) 100 MG tablet    Sig: Take 1 tablet (100 mg total) by mouth daily.    Dispense:  90 tablet    Refill:  0    Follow-up: Return in about 3 months (around 08/10/2019).

## 2019-05-11 LAB — BASIC METABOLIC PANEL
BUN/Creatinine Ratio: 15 (calc) (ref 6–22)
BUN: 40 mg/dL — ABNORMAL HIGH (ref 7–25)
CO2: 25 mmol/L (ref 20–32)
Calcium: 9.7 mg/dL (ref 8.6–10.3)
Chloride: 106 mmol/L (ref 98–110)
Creat: 2.66 mg/dL — ABNORMAL HIGH (ref 0.70–1.11)
Glucose, Bld: 74 mg/dL (ref 65–99)
Potassium: 4.3 mmol/L (ref 3.5–5.3)
Sodium: 143 mmol/L (ref 135–146)

## 2019-05-11 LAB — IRON,TIBC AND FERRITIN PANEL
%SAT: 20 % (calc) (ref 20–48)
Ferritin: 15 ng/mL — ABNORMAL LOW (ref 24–380)
Iron: 69 ug/dL (ref 50–180)
TIBC: 342 mcg/dL (calc) (ref 250–425)

## 2019-05-11 LAB — URINALYSIS, ROUTINE W REFLEX MICROSCOPIC
Bilirubin Urine: NEGATIVE
Glucose, UA: NEGATIVE
Hgb urine dipstick: NEGATIVE
Ketones, ur: NEGATIVE
Leukocytes,Ua: NEGATIVE
Nitrite: NEGATIVE
Protein, ur: NEGATIVE
Specific Gravity, Urine: 1.011 (ref 1.001–1.03)
pH: 6 (ref 5.0–8.0)

## 2019-05-11 LAB — CBC
HCT: 34.6 % — ABNORMAL LOW (ref 38.5–50.0)
Hemoglobin: 11.2 g/dL — ABNORMAL LOW (ref 13.2–17.1)
MCH: 29.2 pg (ref 27.0–33.0)
MCHC: 32.4 g/dL (ref 32.0–36.0)
MCV: 90.3 fL (ref 80.0–100.0)
MPV: 12.8 fL — ABNORMAL HIGH (ref 7.5–12.5)
Platelets: 195 10*3/uL (ref 140–400)
RBC: 3.83 10*6/uL — ABNORMAL LOW (ref 4.20–5.80)
RDW: 12.9 % (ref 11.0–15.0)
WBC: 7.4 10*3/uL (ref 3.8–10.8)

## 2019-05-11 LAB — TSH: TSH: 3.44 mIU/L (ref 0.40–4.50)

## 2019-05-11 LAB — VITAMIN B12: Vitamin B-12: 601 pg/mL (ref 200–1100)

## 2019-05-30 DIAGNOSIS — R3914 Feeling of incomplete bladder emptying: Secondary | ICD-10-CM | POA: Diagnosis not present

## 2019-05-30 DIAGNOSIS — N139 Obstructive and reflux uropathy, unspecified: Secondary | ICD-10-CM | POA: Diagnosis not present

## 2019-05-30 DIAGNOSIS — N401 Enlarged prostate with lower urinary tract symptoms: Secondary | ICD-10-CM | POA: Diagnosis not present

## 2019-06-02 DIAGNOSIS — I129 Hypertensive chronic kidney disease with stage 1 through stage 4 chronic kidney disease, or unspecified chronic kidney disease: Secondary | ICD-10-CM | POA: Diagnosis not present

## 2019-06-02 DIAGNOSIS — D631 Anemia in chronic kidney disease: Secondary | ICD-10-CM | POA: Diagnosis not present

## 2019-06-02 DIAGNOSIS — I739 Peripheral vascular disease, unspecified: Secondary | ICD-10-CM | POA: Diagnosis not present

## 2019-06-02 DIAGNOSIS — R7302 Impaired glucose tolerance (oral): Secondary | ICD-10-CM | POA: Diagnosis not present

## 2019-06-02 DIAGNOSIS — N189 Chronic kidney disease, unspecified: Secondary | ICD-10-CM | POA: Diagnosis not present

## 2019-06-02 DIAGNOSIS — N2581 Secondary hyperparathyroidism of renal origin: Secondary | ICD-10-CM | POA: Diagnosis not present

## 2019-06-02 DIAGNOSIS — N184 Chronic kidney disease, stage 4 (severe): Secondary | ICD-10-CM | POA: Diagnosis not present

## 2019-06-13 DIAGNOSIS — H2513 Age-related nuclear cataract, bilateral: Secondary | ICD-10-CM | POA: Diagnosis not present

## 2019-06-13 DIAGNOSIS — H5203 Hypermetropia, bilateral: Secondary | ICD-10-CM | POA: Diagnosis not present

## 2019-08-24 ENCOUNTER — Ambulatory Visit (INDEPENDENT_AMBULATORY_CARE_PROVIDER_SITE_OTHER): Payer: Medicare Other | Admitting: Emergency Medicine

## 2019-08-24 ENCOUNTER — Other Ambulatory Visit: Payer: Self-pay

## 2019-08-24 ENCOUNTER — Encounter: Payer: Self-pay | Admitting: Emergency Medicine

## 2019-08-24 VITALS — BP 140/68 | HR 80 | Temp 98.6°F | Resp 16 | Ht 69.0 in | Wt 120.8 lb

## 2019-08-24 DIAGNOSIS — N1832 Chronic kidney disease, stage 3b: Secondary | ICD-10-CM | POA: Diagnosis not present

## 2019-08-24 DIAGNOSIS — Z7689 Persons encountering health services in other specified circumstances: Secondary | ICD-10-CM

## 2019-08-24 DIAGNOSIS — I10 Essential (primary) hypertension: Secondary | ICD-10-CM | POA: Diagnosis not present

## 2019-08-24 DIAGNOSIS — H9193 Unspecified hearing loss, bilateral: Secondary | ICD-10-CM

## 2019-08-24 DIAGNOSIS — R600 Localized edema: Secondary | ICD-10-CM | POA: Diagnosis not present

## 2019-08-24 DIAGNOSIS — Z8719 Personal history of other diseases of the digestive system: Secondary | ICD-10-CM | POA: Diagnosis not present

## 2019-08-24 MED ORDER — LOSARTAN POTASSIUM 100 MG PO TABS: 100.0000 mg | ORAL_TABLET | Freq: Every day | ORAL | 1 refills | Status: DC

## 2019-08-24 MED ORDER — AMLODIPINE BESYLATE 10 MG PO TABS: 10.0000 mg | ORAL_TABLET | Freq: Every day | ORAL | 1 refills | Status: DC

## 2019-08-24 MED ORDER — FUROSEMIDE 40 MG PO TABS: ORAL_TABLET | ORAL | 1 refills | Status: DC

## 2019-08-24 NOTE — Progress Notes (Signed)
Ryan Cortez 85 y.o.   Chief Complaint  Patient presents with  . Establish Care  . Medication Refill    amlodipine, furosemide and losartan    HISTORY OF PRESENT ILLNESS: This is a 85 y.o. male here to establish care with me, first visit to this office.  Has a history of hypertension and chronic kidney disease.  Needs medication refills: Amlodipine, furosemide, and losartan.  Takes furosemide for recurrent lower extremity edema. Has no complaints or medical concerns today.  Hard of hearing. Old medical records reviewed.  Recent blood work reviewed HPI   Prior to Admission medications   Medication Sig Start Date End Date Taking? Authorizing Provider  amLODipine (NORVASC) 10 MG tablet Take 1 tablet (10 mg total) by mouth daily. 05/10/19  Yes Libby Maw, MD  furosemide (LASIX) 40 MG tablet TAKE ONE TABLET BY MOUTH ONCE DAILY 05/10/19  Yes Libby Maw, MD  losartan (COZAAR) 100 MG tablet Take 1 tablet (100 mg total) by mouth daily. 05/10/19  Yes Libby Maw, MD    No Known Allergies  Patient Active Problem List   Diagnosis Date Noted  . Vitamin B 12 deficiency 05/10/2019  . Anemia 05/12/2012  . CKD (chronic kidney disease) stage 3, GFR 30-59 ml/min 04/22/2011  . HEARING LOSS, BILATERAL 04/19/2010  . History of positive PPD, untreated 03/08/2008  . BPH (benign prostatic hypertrophy) 03/08/2008  . Essential hypertension 02/26/2008  . PYLORIC STENOSIS 07/01/2007    Past Medical History:  Diagnosis Date  . Anemia    iron Rxed by Dr Ardis Hughs  . BPH (benign prostatic hyperplasia)   . Fasting hyperglycemia   . Hyperlipidemia   . Hypertension   . Other abnormal glucose 2008   A1c 6.1%  . Positive PPD 1991   no INH prophylaxis due to age  . PVD (peripheral vascular disease) ([place])   . Pyloric stenosis   . Renal insufficiency    Dr Justin Mend    Past Surgical History:  Procedure Laterality Date  . CHOLECYSTECTOMY    . COLONOSCOPY W/ POLYPECTOMY     . LIVER BIOPSY    . UPPER GASTROINTESTINAL ENDOSCOPY  02/2007   Dr. Oretha Caprice; gastritis,incomplete pyloric stricture with partial outlet obstruction    Social History   Socioeconomic History  . Marital status: Divorced    Spouse name: Not on file  . Number of children: Not on file  . Years of education: Not on file  . Highest education level: Not on file  Occupational History  . Occupation: Retired  Scientific laboratory technician  . Financial resource strain: Not on file  . Food insecurity    Worry: Not on file    Inability: Not on file  . Transportation needs    Medical: Not on file    Non-medical: Not on file  Tobacco Use  . Smoking status: Former Smoker    Quit date: 11/11/1975    Years since quitting: 43.8  . Smokeless tobacco: Never Used  . Tobacco comment: smoked age [age],up to 1/2 ppd  Substance and Sexual Activity  . Alcohol use: No  . Drug use: No  . Sexual activity: Never  Lifestyle  . Physical activity    Days per week: Not on file    Minutes per session: Not on file  . Stress: Not on file  Relationships  . Social Herbalist on phone: Not on file    Gets together: Not on file    Attends religious service: Not  on file    Active member of club or organization: Not on file    Attends meetings of clubs or organizations: Not on file    Relationship status: Not on file  . Intimate partner violence    Fear of current or ex partner: Not on file    Emotionally abused: Not on file    Physically abused: Not on file    Forced sexual activity: Not on file  Other Topics Concern  . Not on file  Social History Narrative   [employer]    Family History  Problem Relation Age of Onset  . Stroke Father 44  . Hypertension Mother   . Heart attack Mother 75  . Cancer Maternal Aunt        ? primary  . Alcohol abuse Brother   . Diabetes Neg Hx      Review of Systems  Constitutional: Negative.  Negative for chills and fever.  HENT: Negative for sore  throat.        Hard of hearing  Respiratory: Negative.  Negative for cough and shortness of breath.   Cardiovascular: Negative.  Negative for chest pain and palpitations.  Gastrointestinal: Negative.  Negative for diarrhea, nausea and vomiting.  Genitourinary: Negative for dysuria and hematuria.  Musculoskeletal: Negative for back pain and neck pain.  Skin: Negative.  Negative for rash.  Neurological: Negative.  Negative for dizziness and headaches.  All other systems reviewed and are negative.  Vitals:   08/24/19 1333  BP: (!) 155/88  Pulse: 80  Resp: 16  Temp: 98.6 F (37 C)  SpO2: 100%     Physical Exam Vitals signs reviewed.  Constitutional:      Appearance: Normal appearance.  HENT:     Head: Normocephalic.     Mouth/Throat:     Mouth: Mucous membranes are moist.     Pharynx: Oropharynx is clear.  Eyes:     Extraocular Movements: Extraocular movements intact.     Pupils: Pupils are equal, round, and reactive to light.  Neck:     Musculoskeletal: No muscular tenderness.  Cardiovascular:     Rate and Rhythm: Normal rate and regular rhythm.     Pulses: Normal pulses.     Heart sounds: Normal heart sounds.     Comments: Positive extrasystoles Pulmonary:     Effort: Pulmonary effort is normal.     Breath sounds: Normal breath sounds.  Abdominal:     General: There is no distension.     Palpations: Abdomen is soft.     Tenderness: There is no abdominal tenderness.  Musculoskeletal: Normal range of motion.     Right lower leg: No edema.     Left lower leg: No edema.  Lymphadenopathy:     Cervical: No cervical adenopathy.  Skin:    General: Skin is warm and dry.     Capillary Refill: Capillary refill takes less than 2 seconds.  Neurological:     General: No focal deficit present.     Mental Status: He is alert and oriented to person, place, and time.  Psychiatric:        Mood and Affect: Mood normal.        Behavior: Behavior normal.      ASSESSMENT &  PLAN:  Maher was seen today for establish care and medication refill.  Diagnoses and all orders for this visit:  Essential hypertension -     losartan (COZAAR) 100 MG tablet; Take 1 tablet (100 mg  total) by mouth daily. -     amLODipine (NORVASC) 10 MG tablet; Take 1 tablet (10 mg total) by mouth daily.  Stage 3b chronic kidney disease  Lower extremity edema -     furosemide (LASIX) 40 MG tablet; TAKE ONE TABLET BY MOUTH ONCE DAILY  Encounter to establish care  History of pyloric stenosis  Bilateral hearing loss, unspecified hearing loss type    Patient Instructions       If you have lab work done today you will be contacted with your lab results within the next 2 weeks.  If you have not heard from [place] then please contact us. The fastest way to get your results is to register for My Chart.   IF you received an x-ray today, you will receive an invoice from [facility]. Please contact [facility] at [phone] with questions or concerns regarding your invoice.   IF you received labwork today, you will receive an invoice from [place] [facility]. Please contact [facility] at [phone] with questions or concerns regarding your invoice.   Our billing staff will not be able to assist you with questions regarding bills from these companies.  You will be contacted with the lab results as soon as they are available. The fastest way to get your results is to activate your My Chart account. Instructions are located on the last page of this paperwork. If you have not heard from [place] regarding the results in 2 weeks, please contact this office.     Hypertension, Adult High blood pressure (hypertension) is when the force of blood pumping through the arteries is too strong. The arteries are the blood vessels that carry blood from the heart throughout the body. Hypertension forces the heart to work harder to pump blood and may cause arteries to become narrow or stiff. Untreated or  uncontrolled hypertension can cause a heart attack, heart failure, a stroke, kidney disease, and other problems. A blood pressure reading consists of a higher number over a lower number. Ideally, your blood pressure should be below 120/80. The first ("top") number is called the systolic pressure. It is a measure of the pressure in your arteries as your heart beats. The second ("bottom") number is called the diastolic pressure. It is a measure of the pressure in your arteries as the heart relaxes. What are the causes? The exact cause of this condition is not known. There are some conditions that result in or are related to high blood pressure. What increases the risk? Some risk factors for high blood pressure are under your control. The following factors may make you more likely to develop this condition:  Smoking.  Having type 2 diabetes mellitus, high cholesterol, or both.  Not getting enough exercise or physical activity.  Being overweight.  Having too much fat, sugar, calories, or salt (sodium) in your diet.  Drinking too much alcohol. Some risk factors for high blood pressure may be difficult or impossible to change. Some of these factors include:  Having chronic kidney disease.  Having a family history of high blood pressure.  Age. Risk increases with age.  Race. You may be at higher risk if you are African American.  Gender. Men are at higher risk than women before age 74. After age 44, women are at higher risk than men.  Having obstructive sleep apnea.  Stress. What are the signs or symptoms? High blood pressure may not cause symptoms. Very high blood pressure (hypertensive crisis) may cause:  Headache.  Anxiety.  Shortness  of breath.  Nosebleed.  Nausea and vomiting.  Vision changes.  Severe chest pain.  Seizures. How is this diagnosed? This condition is diagnosed by measuring your blood pressure while you are seated, with your arm resting on a flat  surface, your legs uncrossed, and your feet flat on the floor. The cuff of the blood pressure monitor will be placed directly against the skin of your upper arm at the level of your heart. It should be measured at least twice using the same arm. Certain conditions can cause a difference in blood pressure between your right and left arms. Certain factors can cause blood pressure readings to be lower or higher than normal for a short period of time:  When your blood pressure is higher when you are in a health care provider's office than when you are at home, this is called white coat hypertension. Most people with this condition do not need medicines.  When your blood pressure is higher at home than when you are in a health care provider's office, this is called masked hypertension. Most people with this condition may need medicines to control blood pressure. If you have a high blood pressure reading during one visit or you have normal blood pressure with other risk factors, you may be asked to:  Return on a different day to have your blood pressure checked again.  Monitor your blood pressure at home for 1 week or longer. If you are diagnosed with hypertension, you may have other blood or imaging tests to help your health care provider understand your overall risk for other conditions. How is this treated? This condition is treated by making healthy lifestyle changes, such as eating healthy foods, exercising more, and reducing your alcohol intake. Your health care provider may prescribe medicine if lifestyle changes are not enough to get your blood pressure under control, and if:  Your systolic blood pressure is above 130.  Your diastolic blood pressure is above 80. Your personal target blood pressure may vary depending on your medical conditions, your age, and other factors. Follow these instructions at home: Eating and drinking   Eat a diet that is high in fiber and potassium, and low in  sodium, added sugar, and fat. An example eating plan is called the DASH (Dietary Approaches to Stop Hypertension) diet. To eat this way: ? Eat plenty of fresh fruits and vegetables. Try to fill one half of your plate at each meal with fruits and vegetables. ? Eat whole grains, such as whole-wheat pasta, brown rice, or whole-grain bread. Fill about one fourth of your plate with whole grains. ? Eat or drink low-fat dairy products, such as skim milk or low-fat yogurt. ? Avoid fatty cuts of meat, processed or cured meats, and poultry with skin. Fill about one fourth of your plate with lean proteins, such as fish, chicken without skin, beans, eggs, or tofu. ? Avoid pre-made and processed foods. These tend to be higher in sodium, added sugar, and fat.  Reduce your daily sodium intake. Most people with hypertension should eat less than 1,500 mg of sodium a day.  Do not drink alcohol if: ? Your health care provider tells you not to drink. ? You are pregnant, may be pregnant, or are planning to become pregnant.  If you drink alcohol: ? Limit how much you use to:  0-1 drink a day for women.  0-2 drinks a day for men. ? Be aware of how much alcohol is in your drink. In the  [place] one drink equals one 12 oz bottle of beer (355 mL), one 5 oz glass of wine (148 mL), or one 1 oz glass of hard liquor (44 mL). Lifestyle   Work with your health care provider to maintain a healthy body weight or to lose weight. Ask what an ideal weight is for you.  Get at least 30 minutes of exercise most days of the week. Activities may include walking, swimming, or biking.  Include exercise to strengthen your muscles (resistance exercise), such as Pilates or lifting weights, as part of your weekly exercise routine. Try to do these types of exercises for 30 minutes at least 3 days a week.  Do not use any products that contain nicotine or tobacco, such as cigarettes, e-cigarettes, and chewing tobacco. If you need help  quitting, ask your health care provider.  Monitor your blood pressure at home as told by your health care provider.  Keep all follow-up visits as told by your health care provider. This is important. Medicines  Take over-the-counter and prescription medicines only as told by your health care provider. Follow directions carefully. Blood pressure medicines must be taken as prescribed.  Do not skip doses of blood pressure medicine. Doing this puts you at risk for problems and can make the medicine less effective.  Ask your health care provider about side effects or reactions to medicines that you should watch for. Contact a health care provider if you:  Think you are having a reaction to a medicine you are taking.  Have headaches that keep coming back (recurring).  Feel dizzy.  Have swelling in your ankles.  Have trouble with your vision. Get help right away if you:  Develop a severe headache or confusion.  Have unusual weakness or numbness.  Feel faint.  Have severe pain in your chest or abdomen.  Vomit repeatedly.  Have trouble breathing. Summary  Hypertension is when the force of blood pumping through your arteries is too strong. If this condition is not controlled, it may put you at risk for serious complications.  Your personal target blood pressure may vary depending on your medical conditions, your age, and other factors. For most people, a normal blood pressure is less than 120/80.  Hypertension is treated with lifestyle changes, medicines, or a combination of both. Lifestyle changes include losing weight, eating a healthy, low-sodium diet, exercising more, and limiting alcohol. This information is not intended to replace advice given to you by your health care provider. Make sure you discuss any questions you have with your health care provider. Document Released: 10/27/2005 Document Revised: 07/07/2018 Document Reviewed: 07/07/2018 Elsevier Patient Education  2020  [employer]     Agustina Caroli, MD [facility]

## 2019-08-24 NOTE — Patient Instructions (Addendum)
   If you have lab work done today you will be contacted with your lab results within the next 2 weeks.  If you have not heard from us then please contact us. The fastest way to get your results is to register for My Chart.   IF you received an x-ray today, you will receive an invoice from [facility]. Please contact  Radiology at [phone] with questions or concerns regarding your invoice.   IF you received labwork today, you will receive an invoice from [facility]. Please contact [facility] at [phone] with questions or concerns regarding your invoice.   Our billing staff will not be able to assist you with questions regarding bills from these companies.  You will be contacted with the lab results as soon as they are available. The fastest way to get your results is to activate your My Chart account. Instructions are located on the last page of this paperwork. If you have not heard from us regarding the results in 2 weeks, please contact this office.     Hypertension, Adult High blood pressure (hypertension) is when the force of blood pumping through the arteries is too strong. The arteries are the blood vessels that carry blood from the heart throughout the body. Hypertension forces the heart to work harder to pump blood and may cause arteries to become narrow or stiff. Untreated or uncontrolled hypertension can cause a heart attack, heart failure, a stroke, kidney disease, and other problems. A blood pressure reading consists of a higher number over a lower number. Ideally, your blood pressure should be below 120/80. The first ("top") number is called the systolic pressure. It is a measure of the pressure in your arteries as your heart beats. The second ("bottom") number is called the diastolic pressure. It is a measure of the pressure in your arteries as the heart relaxes. What are the causes? The exact cause of this condition is not known. There are some conditions  that result in or are related to high blood pressure. What increases the risk? Some risk factors for high blood pressure are under your control. The following factors may make you more likely to develop this condition:  Smoking.  Having type 2 diabetes mellitus, high cholesterol, or both.  Not getting enough exercise or physical activity.  Being overweight.  Having too much fat, sugar, calories, or salt (sodium) in your diet.  Drinking too much alcohol. Some risk factors for high blood pressure may be difficult or impossible to change. Some of these factors include:  Having chronic kidney disease.  Having a family history of high blood pressure.  Age. Risk increases with age.  Race. You may be at higher risk if you are African American.  Gender. Men are at higher risk than women before age 45. After age 65, women are at higher risk than men.  Having obstructive sleep apnea.  Stress. What are the signs or symptoms? High blood pressure may not cause symptoms. Very high blood pressure (hypertensive crisis) may cause:  Headache.  Anxiety.  Shortness of breath.  Nosebleed.  Nausea and vomiting.  Vision changes.  Severe chest pain.  Seizures. How is this diagnosed? This condition is diagnosed by measuring your blood pressure while you are seated, with your arm resting on a flat surface, your legs uncrossed, and your feet flat on the floor. The cuff of the blood pressure monitor will be placed directly against the skin of your upper arm at the level of your heart.   It should be measured at least twice using the same arm. Certain conditions can cause a difference in blood pressure between your right and left arms. Certain factors can cause blood pressure readings to be lower or higher than normal for a short period of time:  When your blood pressure is higher when you are in a health care provider's office than when you are at home, this is called white coat hypertension.  Most people with this condition do not need medicines.  When your blood pressure is higher at home than when you are in a health care provider's office, this is called masked hypertension. Most people with this condition may need medicines to control blood pressure. If you have a high blood pressure reading during one visit or you have normal blood pressure with other risk factors, you may be asked to:  Return on a different day to have your blood pressure checked again.  Monitor your blood pressure at home for 1 week or longer. If you are diagnosed with hypertension, you may have other blood or imaging tests to help your health care provider understand your overall risk for other conditions. How is this treated? This condition is treated by making healthy lifestyle changes, such as eating healthy foods, exercising more, and reducing your alcohol intake. Your health care provider may prescribe medicine if lifestyle changes are not enough to get your blood pressure under control, and if:  Your systolic blood pressure is above 130.  Your diastolic blood pressure is above 80. Your personal target blood pressure may vary depending on your medical conditions, your age, and other factors. Follow these instructions at home: Eating and drinking   Eat a diet that is high in fiber and potassium, and low in sodium, added sugar, and fat. An example eating plan is called the DASH (Dietary Approaches to Stop Hypertension) diet. To eat this way: ? Eat plenty of fresh fruits and vegetables. Try to fill one half of your plate at each meal with fruits and vegetables. ? Eat whole grains, such as whole-wheat pasta, brown rice, or whole-grain bread. Fill about one fourth of your plate with whole grains. ? Eat or drink low-fat dairy products, such as skim milk or low-fat yogurt. ? Avoid fatty cuts of meat, processed or cured meats, and poultry with skin. Fill about one fourth of your plate with lean proteins, such  as fish, chicken without skin, beans, eggs, or tofu. ? Avoid pre-made and processed foods. These tend to be higher in sodium, added sugar, and fat.  Reduce your daily sodium intake. Most people with hypertension should eat less than 1,500 mg of sodium a day.  Do not drink alcohol if: ? Your health care provider tells you not to drink. ? You are pregnant, may be pregnant, or are planning to become pregnant.  If you drink alcohol: ? Limit how much you use to:  0-1 drink a day for women.  0-2 drinks a day for men. ? Be aware of how much alcohol is in your drink. In the [place] one drink equals one 12 oz bottle of beer (355 mL), one 5 oz glass of wine (148 mL), or one 1 oz glass of hard liquor (44 mL). Lifestyle   Work with your health care provider to maintain a healthy body weight or to lose weight. Ask what an ideal weight is for you.  Get at least 30 minutes of exercise most days of the week. Activities may include walking, swimming, or   biking.  Include exercise to strengthen your muscles (resistance exercise), such as Pilates or lifting weights, as part of your weekly exercise routine. Try to do these types of exercises for 30 minutes at least 3 days a week.  Do not use any products that contain nicotine or tobacco, such as cigarettes, e-cigarettes, and chewing tobacco. If you need help quitting, ask your health care provider.  Monitor your blood pressure at home as told by your health care provider.  Keep all follow-up visits as told by your health care provider. This is important. Medicines  Take over-the-counter and prescription medicines only as told by your health care provider. Follow directions carefully. Blood pressure medicines must be taken as prescribed.  Do not skip doses of blood pressure medicine. Doing this puts you at risk for problems and can make the medicine less effective.  Ask your health care provider about side effects or reactions to medicines that you  should watch for. Contact a health care provider if you:  Think you are having a reaction to a medicine you are taking.  Have headaches that keep coming back (recurring).  Feel dizzy.  Have swelling in your ankles.  Have trouble with your vision. Get help right away if you:  Develop a severe headache or confusion.  Have unusual weakness or numbness.  Feel faint.  Have severe pain in your chest or abdomen.  Vomit repeatedly.  Have trouble breathing. Summary  Hypertension is when the force of blood pumping through your arteries is too strong. If this condition is not controlled, it may put you at risk for serious complications.  Your personal target blood pressure may vary depending on your medical conditions, your age, and other factors. For most people, a normal blood pressure is less than 120/80.  Hypertension is treated with lifestyle changes, medicines, or a combination of both. Lifestyle changes include losing weight, eating a healthy, low-sodium diet, exercising more, and limiting alcohol. This information is not intended to replace advice given to you by your health care provider. Make sure you discuss any questions you have with your health care provider. Document Released: 10/27/2005 Document Revised: 07/07/2018 Document Reviewed: 07/07/2018 Elsevier Patient Education  2020 Elsevier Inc.  

## 2019-08-30 ENCOUNTER — Other Ambulatory Visit: Payer: Self-pay | Admitting: Emergency Medicine

## 2019-08-30 DIAGNOSIS — I10 Essential (primary) hypertension: Secondary | ICD-10-CM

## 2019-08-30 DIAGNOSIS — R600 Localized edema: Secondary | ICD-10-CM

## 2019-08-30 NOTE — Telephone Encounter (Signed)
Medication Refill - Medication:   amLODipine (NORVASC) 10 MG tablet    furosemide (LASIX) 40 MG tablet    losartan (COZAAR) 100 MG tablet      Has the patient contacted their pharmacy? Yes - this will be first time PCP refills (Agent: If no, request that the patient contact the pharmacy for the refill.) (Agent: If yes, when and what did the pharmacy advise?)  Preferred Pharmacy (with phone number or street name):  [facility] ([place]), [place] - [address] [phone] (Phone) [phone] (Fax)    Agent: Please be advised that RX refills may take up to 3 business days. We ask that you follow-up with your pharmacy.

## 2019-09-08 DIAGNOSIS — R3914 Feeling of incomplete bladder emptying: Secondary | ICD-10-CM | POA: Diagnosis not present

## 2019-09-08 DIAGNOSIS — N401 Enlarged prostate with lower urinary tract symptoms: Secondary | ICD-10-CM | POA: Diagnosis not present

## 2019-09-08 DIAGNOSIS — N281 Cyst of kidney, acquired: Secondary | ICD-10-CM | POA: Diagnosis not present

## 2019-09-13 DIAGNOSIS — I129 Hypertensive chronic kidney disease with stage 1 through stage 4 chronic kidney disease, or unspecified chronic kidney disease: Secondary | ICD-10-CM | POA: Diagnosis not present

## 2019-09-13 DIAGNOSIS — R7302 Impaired glucose tolerance (oral): Secondary | ICD-10-CM | POA: Diagnosis not present

## 2019-09-13 DIAGNOSIS — I739 Peripheral vascular disease, unspecified: Secondary | ICD-10-CM | POA: Diagnosis not present

## 2019-09-13 DIAGNOSIS — N189 Chronic kidney disease, unspecified: Secondary | ICD-10-CM | POA: Diagnosis not present

## 2019-09-13 DIAGNOSIS — N184 Chronic kidney disease, stage 4 (severe): Secondary | ICD-10-CM | POA: Diagnosis not present

## 2019-09-13 DIAGNOSIS — N2581 Secondary hyperparathyroidism of renal origin: Secondary | ICD-10-CM | POA: Diagnosis not present

## 2019-09-13 DIAGNOSIS — D631 Anemia in chronic kidney disease: Secondary | ICD-10-CM | POA: Diagnosis not present

## 2020-01-26 ENCOUNTER — Telehealth: Payer: Self-pay | Admitting: Emergency Medicine

## 2020-01-26 NOTE — Telephone Encounter (Signed)
Called patient to reschedule appointment due to provider not being in the office for date of service

## 2020-02-22 ENCOUNTER — Ambulatory Visit: Payer: Medicare Other | Admitting: Emergency Medicine

## 2020-02-23 ENCOUNTER — Telehealth: Payer: Self-pay | Admitting: *Deleted

## 2020-02-23 ENCOUNTER — Ambulatory Visit: Payer: Medicare Other | Admitting: Emergency Medicine

## 2020-02-23 NOTE — Telephone Encounter (Signed)
SCHEDULE AWV 

## 2020-02-24 ENCOUNTER — Telehealth: Payer: Self-pay | Admitting: Emergency Medicine

## 2020-02-24 NOTE — Telephone Encounter (Signed)
Pt called back to sch awv please advise.

## 2020-02-28 ENCOUNTER — Ambulatory Visit: Payer: Medicare Other | Admitting: Emergency Medicine

## 2020-03-01 ENCOUNTER — Encounter: Payer: Self-pay | Admitting: Emergency Medicine

## 2020-03-06 ENCOUNTER — Ambulatory Visit (INDEPENDENT_AMBULATORY_CARE_PROVIDER_SITE_OTHER): Payer: Medicare Other | Admitting: Emergency Medicine

## 2020-03-06 ENCOUNTER — Other Ambulatory Visit: Payer: Self-pay

## 2020-03-06 VITALS — BP 142/64 | HR 94 | Temp 98.3°F | Resp 16 | Ht 67.0 in | Wt 122.8 lb

## 2020-03-06 DIAGNOSIS — H9193 Unspecified hearing loss, bilateral: Secondary | ICD-10-CM

## 2020-03-06 NOTE — Patient Instructions (Signed)
Thank you for taking time to come for your Medicare Wellness Visit. I appreciate your ongoing commitment to your health goals. Please review the following plan we discussed and let me know if I can assist you in the future.  Leroy Kennedy LPN  Preventive Care 64 Years and Older, Male Preventive care refers to lifestyle choices and visits with your health care provider that can promote health and wellness. This includes:  A yearly physical exam. This is also called an annual well check.  Regular dental and eye exams.  Immunizations.  Screening for certain conditions.  Healthy lifestyle choices, such as diet and exercise. What can I expect for my preventive care visit? Physical exam Your health care provider will check:  Height and weight. These may be used to calculate body mass index (BMI), which is a measurement that tells if you are at a healthy weight.  Heart rate and blood pressure.  Your skin for abnormal spots. Counseling Your health care provider may ask you questions about:  Alcohol, tobacco, and drug use.  Emotional well-being.  Home and relationship well-being.  Sexual activity.  Eating habits.  History of falls.  Memory and ability to understand (cognition).  Work and work Statistician. What immunizations do I need?  Influenza (flu) vaccine  This is recommended every year. Tetanus, diphtheria, and pertussis (Tdap) vaccine  You may need a Td booster every 10 years. Varicella (chickenpox) vaccine  You may need this vaccine if you have not already been vaccinated. Zoster (shingles) vaccine  You may need this after age 66. Pneumococcal conjugate (PCV13) vaccine  One dose is recommended after age 84. Pneumococcal polysaccharide (PPSV23) vaccine  One dose is recommended after age 88. Measles, mumps, and rubella (MMR) vaccine  You may need at least one dose of MMR if you were born in 1957 or later. You may also need a second dose. Meningococcal  conjugate (MenACWY) vaccine  You may need this if you have certain conditions. Hepatitis A vaccine  You may need this if you have certain conditions or if you travel or work in places where you may be exposed to hepatitis A. Hepatitis B vaccine  You may need this if you have certain conditions or if you travel or work in places where you may be exposed to hepatitis B. Haemophilus influenzae type b (Hib) vaccine  You may need this if you have certain conditions. You may receive vaccines as individual doses or as more than one vaccine together in one shot (combination vaccines). Talk with your health care provider about the risks and benefits of combination vaccines. What tests do I need? Blood tests  Lipid and cholesterol levels. These may be checked every 5 years, or more frequently depending on your overall health.  Hepatitis C test.  Hepatitis B test. Screening  Lung cancer screening. You may have this screening every year starting at age 24 if you have a 30-pack-year history of smoking and currently smoke or have quit within the past 15 years.  Colorectal cancer screening. All adults should have this screening starting at age 52 and continuing until age 61. Your health care provider may recommend screening at age 57 if you are at increased risk. You will have tests every 1-10 years, depending on your results and the type of screening test.  Prostate cancer screening. Recommendations will vary depending on your family history and other risks.  Diabetes screening. This is done by checking your blood sugar (glucose) after you have not eaten for  a while (fasting). You may have this done every 1-3 years.  Abdominal aortic aneurysm (AAA) screening. You may need this if you are a current or former smoker.  Sexually transmitted disease (STD) testing. Follow these instructions at home: Eating and drinking  Eat a diet that includes fresh fruits and vegetables, whole grains, lean  protein, and low-fat dairy products. Limit your intake of foods with high amounts of sugar, saturated fats, and salt.  Take vitamin and mineral supplements as recommended by your health care provider.  Do not drink alcohol if your health care provider tells you not to drink.  If you drink alcohol: ? Limit how much you have to 0-2 drinks a day. ? Be aware of how much alcohol is in your drink. In the [place] one drink equals one 12 oz bottle of beer (355 mL), one 5 oz glass of wine (148 mL), or one 1 oz glass of hard liquor (44 mL). Lifestyle  Take daily care of your teeth and gums.  Stay active. Exercise for at least 30 minutes on 5 or more days each week.  Do not use any products that contain nicotine or tobacco, such as cigarettes, e-cigarettes, and chewing tobacco. If you need help quitting, ask your health care provider.  If you are sexually active, practice safe sex. Use a condom or other form of protection to prevent STIs (sexually transmitted infections).  Talk with your health care provider about taking a low-dose aspirin or statin. What's next?  Visit your health care provider once a year for a well check visit.  Ask your health care provider how often you should have your eyes and teeth checked.  Stay up to date on all vaccines. This information is not intended to replace advice given to you by your health care provider. Make sure you discuss any questions you have with your health care provider. Document Revised: 10/21/2018 Document Reviewed: 10/21/2018 Elsevier Patient Education  2020 Elsevier Inc.  

## 2020-03-06 NOTE — Progress Notes (Signed)
Presents today for [employer] Visit   Date of last exam: 08/24/2019  Interpreter used for this visit? No  Patient seen in clinic  Patient Care Team: Horald Pollen, MD as PCP - General (Internal Medicine) Andria Meuse as Physician Assistant (Nephrology)   Other items to address today:  Discussed immunizations Discussed Eye/Dental Referral place for audiology patient has hearing loss  Patient will schedule a follow up for his hypertension    Other Screening:  Last lipid screening:03/11/2017  ADVANCE DIRECTIVES: Discussed: yes On File: no Materials Provided:  yes  Immunization status:   There is no immunization history on file for this patient.   Health Maintenance Due  Topic Date Due  . COVID-19 Vaccine (1) Never done     Functional Status Survey: Is the patient deaf or have difficulty hearing?: Yes(patient states he ordered some hearing aids) Does the patient have difficulty seeing, even when wearing glasses/contacts?: No Does the patient have difficulty concentrating, remembering, or making decisions?: No Does the patient have difficulty walking or climbing stairs?: No Does the patient have difficulty dressing or bathing?: No Does the patient have difficulty doing errands alone such as visiting a doctor's office or shopping?: No   6CIT Screen 03/06/2020  What month? 0 points  What time? 0 points  Count back from 20 4 points  Months in reverse 4 points  Repeat phrase 4 points        Clinical Support from 03/06/2020 in [place] [facility]  AUDIT-C Score  0       Home Environment:   Patient lives alone one story home No scattered rugs No grab bars Adequate lighting/ no clutter Can climb stairs but goes slow Was able to get up and down in seat Patient is still driving States he is doing well  Eats out most nights at [employer] or [employer] Cooks breakfast at home    Cereal/grits/pinto  beans  Patient Active Problem List   Diagnosis Date Noted  . Vitamin B 12 deficiency 05/10/2019  . Anemia 05/12/2012  . CKD (chronic kidney disease) stage 3, GFR 30-59 ml/min 04/22/2011  . HEARING LOSS, BILATERAL 04/19/2010  . History of positive PPD, untreated 03/08/2008  . BPH (benign prostatic hypertrophy) 03/08/2008  . Essential hypertension 02/26/2008  . PYLORIC STENOSIS 07/01/2007     Past Medical History:  Diagnosis Date  . Anemia    iron Rxed by Dr Ardis Hughs  . Arthritis   . BPH (benign prostatic hyperplasia)   . Diabetes ([place])   . Fasting hyperglycemia   . Hyperlipidemia   . Hypertension   . Other abnormal glucose 2008   A1c 6.1%  . Positive PPD 1991   no INH prophylaxis due to age  . PVD (peripheral vascular disease) ([place])   . Pyloric stenosis   . Renal insufficiency    Dr Justin Mend     Past Surgical History:  Procedure Laterality Date  . CHOLECYSTECTOMY    . COLONOSCOPY W/ POLYPECTOMY    . LIVER BIOPSY    . UPPER GASTROINTESTINAL ENDOSCOPY  02/2007   Dr. Oretha Caprice; gastritis,incomplete pyloric stricture with partial outlet obstruction     Family History  Problem Relation Age of Onset  . Stroke Father 58  . Hypertension Mother   . Heart attack Mother 81  . Cancer Maternal Aunt        ? primary  . Alcohol abuse Brother   . Diabetes Neg Hx  Social History   Socioeconomic History  . Marital status: Divorced    Spouse name: Not on file  . Number of children: Not on file  . Years of education: Not on file  . Highest education level: Not on file  Occupational History  . Occupation: Retired  Tobacco Use  . Smoking status: Former Smoker    Quit date: 11/11/1975    Years since quitting: 44.3  . Smokeless tobacco: Never Used  . Tobacco comment: smoked age [age],up to 1/2 ppd  Substance and Sexual Activity  . Alcohol use: No  . Drug use: No  . Sexual activity: Never  Other Topics Concern  . Not on file  Social History Narrative   [employer]   Social Determinants of Health   Financial Resource Strain:   . Difficulty of Paying Living Expenses:   Food Insecurity:   . Worried About Charity fundraiser in the Last Year:   . Arboriculturist in the Last Year:   Transportation Needs:   . Film/video editor (Medical):   Marland Kitchen Lack of Transportation (Non-Medical):   Physical Activity:   . Days of Exercise per Week:   . Minutes of Exercise per Session:   Stress:   . Feeling of Stress :   Social Connections:   . Frequency of Communication with Friends and Family:   . Frequency of Social Gatherings with Friends and Family:   . Attends Religious Services:   . Active Member of Clubs or Organizations:   . Attends Archivist Meetings:   Marland Kitchen Marital Status:   Intimate Partner Violence:   . Fear of Current or Ex-Partner:   . Emotionally Abused:   Marland Kitchen Physically Abused:   . Sexually Abused:      No Known Allergies   Prior to Admission medications   Medication Sig Start Date End Date Taking? Authorizing Provider  amLODipine (NORVASC) 10 MG tablet Take 1 tablet (10 mg total) by mouth daily. 08/24/19  Yes Sagardia, Ines Bloomer, MD  finasteride (PROSCAR) 5 MG tablet Take 5 mg by mouth daily.   Yes [provider]  furosemide (LASIX) 40 MG tablet TAKE ONE TABLET BY MOUTH ONCE DAILY 08/24/19  Yes Sagardia, Ines Bloomer, MD  losartan (COZAAR) 100 MG tablet Take 1 tablet (100 mg total) by mouth daily. 08/24/19  Yes Sagardia, Ines Bloomer, MD  tamsulosin (FLOMAX) 0.4 MG CAPS capsule Take 0.4 mg by mouth.   Yes [provider]     Depression screen [facility] 2/9 03/06/2020 08/24/2019 01/26/2017 08/01/2016 07/20/2015  Decreased Interest 0 0 0 0 0  Down, Depressed, Hopeless 0 0 0 0 0  PHQ - 2 Score 0 0 0 0 0     Fall Risk  03/06/2020 08/24/2019 01/26/2017 08/01/2016 07/20/2015  Falls in the past year? 0 0 No No No  Number falls in past yr: 0 - - - -  Injury with Fall? 0 - - - -      PHYSICAL EXAM: BP  (!) 142/64 (BP Location: Left Arm, Patient Position: Sitting, Cuff Size: Small)   Pulse 94   Temp 98.3 F (36.8 C) (Oral)   Resp 16   Ht 5\' 7"  (1.702 m)   Wt 122 lb 12.8 oz (55.7 kg)   SpO2 96%   BMI 19.23 kg/m    Wt Readings from Last 3 Encounters:  03/06/20 122 lb 12.8 oz (55.7 kg)  08/24/19 120 lb 12.8 oz (54.8 kg)  05/10/19  121 lb (54.9 kg)      Education/Counseling provided regarding diet and exercise, prevention of chronic diseases, smoking/tobacco cessation, if applicable, and reviewed "Covered Medicare Preventive Services."

## 2020-03-19 ENCOUNTER — Other Ambulatory Visit: Payer: Self-pay | Admitting: Emergency Medicine

## 2020-03-19 DIAGNOSIS — I10 Essential (primary) hypertension: Secondary | ICD-10-CM

## 2020-03-26 DIAGNOSIS — N184 Chronic kidney disease, stage 4 (severe): Secondary | ICD-10-CM | POA: Diagnosis not present

## 2020-03-26 DIAGNOSIS — D631 Anemia in chronic kidney disease: Secondary | ICD-10-CM | POA: Diagnosis not present

## 2020-03-26 DIAGNOSIS — N189 Chronic kidney disease, unspecified: Secondary | ICD-10-CM | POA: Diagnosis not present

## 2020-03-26 DIAGNOSIS — N2581 Secondary hyperparathyroidism of renal origin: Secondary | ICD-10-CM | POA: Diagnosis not present

## 2020-03-26 DIAGNOSIS — R7302 Impaired glucose tolerance (oral): Secondary | ICD-10-CM | POA: Diagnosis not present

## 2020-03-26 DIAGNOSIS — I129 Hypertensive chronic kidney disease with stage 1 through stage 4 chronic kidney disease, or unspecified chronic kidney disease: Secondary | ICD-10-CM | POA: Diagnosis not present

## 2020-03-26 DIAGNOSIS — I739 Peripheral vascular disease, unspecified: Secondary | ICD-10-CM | POA: Diagnosis not present

## 2020-04-15 ENCOUNTER — Other Ambulatory Visit: Payer: Self-pay | Admitting: Emergency Medicine

## 2020-04-15 DIAGNOSIS — R600 Localized edema: Secondary | ICD-10-CM

## 2020-04-15 NOTE — Telephone Encounter (Signed)
Requested Prescriptions  Pending Prescriptions Disp Refills   furosemide (LASIX) 40 MG tablet [Pharmacy Med Name: FUROSEMIDE 40 MG TABLET] 90 tablet 1    Sig: TAKE 1 TABLET BY MOUTH EVERY DAY     Cardiovascular:  Diuretics - Loop Failed - 04/15/2020  2:30 PM      Failed - Cr in normal range and within 360 days    Creat  Date Value Ref Range Status  05/10/2019 2.66 (H) 0.70 - 1.11 mg/dL Final    Comment:    For patients >84 years of age, the reference limit for Creatinine is approximately 13% higher for people identified as African-American. .    Creatinine,U  Date Value Ref Range Status  01/20/2007 226.0 mg/dL Final         Failed - Last BP in normal range    BP Readings from Last 1 Encounters:  03/06/20 (!) 142/64         Passed - K in normal range and within 360 days    Potassium  Date Value Ref Range Status  05/10/2019 4.3 3.5 - 5.3 mmol/L Final         Passed - Ca in normal range and within 360 days    Calcium  Date Value Ref Range Status  05/10/2019 9.7 8.6 - 10.3 mg/dL Final         Passed - Na in normal range and within 360 days    Sodium  Date Value Ref Range Status  05/10/2019 143 135 - 146 mmol/L Final         Passed - Valid encounter within last 6 months    Recent Outpatient Visits          1 month ago Bilateral hearing loss, unspecified hearing loss type   Primary Care at [facility], Ines Bloomer, MD   7 months ago Essential hypertension   Primary Care at [facility], Ines Bloomer, MD      Future Appointments            In 4 months Sagardia, Ines Bloomer, MD Primary Care at [place], [facility]

## 2020-04-20 ENCOUNTER — Other Ambulatory Visit: Payer: Self-pay | Admitting: Emergency Medicine

## 2020-04-20 DIAGNOSIS — I10 Essential (primary) hypertension: Secondary | ICD-10-CM

## 2020-07-12 DIAGNOSIS — H35013 Changes in retinal vascular appearance, bilateral: Secondary | ICD-10-CM | POA: Diagnosis not present

## 2020-07-12 DIAGNOSIS — H35033 Hypertensive retinopathy, bilateral: Secondary | ICD-10-CM | POA: Diagnosis not present

## 2020-07-12 DIAGNOSIS — H2513 Age-related nuclear cataract, bilateral: Secondary | ICD-10-CM | POA: Diagnosis not present

## 2020-07-12 DIAGNOSIS — H40013 Open angle with borderline findings, low risk, bilateral: Secondary | ICD-10-CM | POA: Diagnosis not present

## 2020-07-19 ENCOUNTER — Other Ambulatory Visit: Payer: Self-pay | Admitting: Emergency Medicine

## 2020-07-19 DIAGNOSIS — I10 Essential (primary) hypertension: Secondary | ICD-10-CM

## 2020-08-02 ENCOUNTER — Other Ambulatory Visit: Payer: Self-pay | Admitting: Emergency Medicine

## 2020-08-02 DIAGNOSIS — I10 Essential (primary) hypertension: Secondary | ICD-10-CM

## 2020-08-02 DIAGNOSIS — R600 Localized edema: Secondary | ICD-10-CM

## 2020-09-04 ENCOUNTER — Other Ambulatory Visit: Payer: Self-pay

## 2020-09-04 ENCOUNTER — Ambulatory Visit (INDEPENDENT_AMBULATORY_CARE_PROVIDER_SITE_OTHER): Payer: Medicare Other | Admitting: Emergency Medicine

## 2020-09-04 ENCOUNTER — Encounter: Payer: Self-pay | Admitting: Emergency Medicine

## 2020-09-04 VITALS — BP 140/58 | HR 83 | Temp 98.2°F | Resp 16 | Ht 69.0 in | Wt 115.0 lb

## 2020-09-04 DIAGNOSIS — N400 Enlarged prostate without lower urinary tract symptoms: Secondary | ICD-10-CM | POA: Diagnosis not present

## 2020-09-04 DIAGNOSIS — N1832 Chronic kidney disease, stage 3b: Secondary | ICD-10-CM

## 2020-09-04 DIAGNOSIS — I10 Essential (primary) hypertension: Secondary | ICD-10-CM | POA: Diagnosis not present

## 2020-09-04 MED ORDER — FINASTERIDE 5 MG PO TABS: 5.0000 mg | ORAL_TABLET | Freq: Every day | ORAL | 3 refills | Status: AC

## 2020-09-04 MED ORDER — TAMSULOSIN HCL 0.4 MG PO CAPS: 0.4000 mg | ORAL_CAPSULE | Freq: Every day | ORAL | 3 refills | Status: AC

## 2020-09-04 NOTE — Patient Instructions (Addendum)
   If you have lab work done today you will be contacted with your lab results within the next 2 weeks.  If you have not heard from us then please contact us. The fastest way to get your results is to register for My Chart.   IF you received an x-ray today, you will receive an invoice from [facility]. Please contact [facility] at [phone] with questions or concerns regarding your invoice.   IF you received labwork today, you will receive an invoice from [facility]. Please contact [facility] at [phone] with questions or concerns regarding your invoice.   Our billing staff will not be able to assist you with questions regarding bills from these companies.  You will be contacted with the lab results as soon as they are available. The fastest way to get your results is to activate your My Chart account. Instructions are located on the last page of this paperwork. If you have not heard from us regarding the results in 2 weeks, please contact this office.     Health Maintenance After Age 65 After age 84, you are at a higher risk for certain long-term diseases and infections as well as injuries from falls. Falls are a major cause of broken bones and head injuries in people who are older than age 84. Getting regular preventive care can help to keep you healthy and well. Preventive care includes getting regular testing and making lifestyle changes as recommended by your health care provider. Talk with your health care provider about:  Which screenings and tests you should have. A screening is a test that checks for a disease when you have no symptoms.  A diet and exercise plan that is right for you. What should I know about screenings and tests to prevent falls? Screening and testing are the best ways to find a health problem early. Early diagnosis and treatment give you the best chance of managing medical conditions that are common after age 84. Certain conditions and  lifestyle choices may make you more likely to have a fall. Your health care provider may recommend:  Regular vision checks. Poor vision and conditions such as cataracts can make you more likely to have a fall. If you wear glasses, make sure to get your prescription updated if your vision changes.  Medicine review. Work with your health care provider to regularly review all of the medicines you are taking, including over-the-counter medicines. Ask your health care provider about any side effects that may make you more likely to have a fall. Tell your health care provider if any medicines that you take make you feel dizzy or sleepy.  Osteoporosis screening. Osteoporosis is a condition that causes the bones to get weaker. This can make the bones weak and cause them to break more easily.  Blood pressure screening. Blood pressure changes and medicines to control blood pressure can make you feel dizzy.  Strength and balance checks. Your health care provider may recommend certain tests to check your strength and balance while standing, walking, or changing positions.  Foot health exam. Foot pain and numbness, as well as not wearing proper footwear, can make you more likely to have a fall.  Depression screening. You may be more likely to have a fall if you have a fear of falling, feel emotionally low, or feel unable to do activities that you used to do.  Alcohol use screening. Using too much alcohol can affect your balance and may make you more likely to   have a fall. What actions can I take to lower my risk of falls? General instructions  Talk with your health care provider about your risks for falling. Tell your health care provider if: ? You fall. Be sure to tell your health care provider about all falls, even ones that seem minor. ? You feel dizzy, sleepy, or off-balance.  Take over-the-counter and prescription medicines only as told by your health care provider. These include any  supplements.  Eat a healthy diet and maintain a healthy weight. A healthy diet includes low-fat dairy products, low-fat (lean) meats, and fiber from whole grains, beans, and lots of fruits and vegetables. Home safety  Remove any tripping hazards, such as rugs, cords, and clutter.  Install safety equipment such as grab bars in bathrooms and safety rails on stairs.  Keep rooms and walkways well-lit. Activity   Follow a regular exercise program to stay fit. This will help you maintain your balance. Ask your health care provider what types of exercise are appropriate for you.  If you need a cane or walker, use it as recommended by your health care provider.  Wear supportive shoes that have nonskid soles. Lifestyle  Do not drink alcohol if your health care provider tells you not to drink.  If you drink alcohol, limit how much you have: ? 0-1 drink a day for women. ? 0-2 drinks a day for men.  Be aware of how much alcohol is in your drink. In the [place] one drink equals one typical bottle of beer (12 oz), one-half glass of wine (5 oz), or one shot of hard liquor (1 oz).  Do not use any products that contain nicotine or tobacco, such as cigarettes and e-cigarettes. If you need help quitting, ask your health care provider. Summary  Having a healthy lifestyle and getting preventive care can help to protect your health and wellness after age 65.  Screening and testing are the best way to find a health problem early and help you avoid having a fall. Early diagnosis and treatment give you the best chance for managing medical conditions that are more common for people who are older than age 84.  Falls are a major cause of broken bones and head injuries in people who are older than age 84. Take precautions to prevent a fall at home.  Work with your health care provider to learn what changes you can make to improve your health and wellness and to prevent falls. This information is not intended  to replace advice given to you by your health care provider. Make sure you discuss any questions you have with your health care provider. Document Revised: 02/17/2019 Document Reviewed: 09/09/2017 Elsevier Patient Education  2020 Elsevier Inc.  

## 2020-09-04 NOTE — Progress Notes (Signed)
Ryan Cortez 86 y.o.   Chief Complaint  Patient presents with  . Hypertension    follow up  . Medication Refill    Finasteride and Tamsulosin    HISTORY OF PRESENT ILLNESS: This is a 86 y.o. male here for hypertension follow-up and medication refill however patient was PCP of record is Ryan. Edrick Cortez from [facility]. Has no complaints or medical concerns today. Most recent office visit notes reviewed.  HPI   Prior to Admission medications   Medication Sig Start Date End Date Taking? Authorizing Provider  amLODipine (NORVASC) 10 MG tablet TAKE 1 TABLET BY MOUTH EVERY DAY 07/19/20  Yes Ryan Cortez, Ryan Bloomer, MD  finasteride (PROSCAR) 5 MG tablet Take 5 mg by mouth daily.   Yes [provider]  furosemide (LASIX) 40 MG tablet TAKE 1 TABLET BY MOUTH EVERY DAY 04/15/20  Yes Rosalita Carey, Ryan Bloomer, MD  losartan (COZAAR) 100 MG tablet TAKE 1 TABLET BY MOUTH EVERY DAY 03/19/20  Yes Tosh Glaze, Ryan Bloomer, MD  tamsulosin (FLOMAX) 0.4 MG CAPS capsule Take 0.4 mg by mouth.   Yes [provider]    No Known Allergies  Patient Active Problem List   Diagnosis Date Noted  . Vitamin B 12 deficiency 05/10/2019  . Anemia 05/12/2012  . CKD (chronic kidney disease) stage 3, GFR 30-59 ml/min (HCC) 04/22/2011  . HEARING LOSS, BILATERAL 04/19/2010  . History of positive PPD, untreated 03/08/2008  . BPH (benign prostatic hypertrophy) 03/08/2008  . Essential hypertension 02/26/2008  . PYLORIC STENOSIS 07/01/2007    Past Medical History:  Diagnosis Date  . Anemia    iron Rxed by Ryan Cortez  . Arthritis   . BPH (benign prostatic hyperplasia)   . Diabetes ([place])   . Fasting hyperglycemia   . Hyperlipidemia   . Hypertension   . Other abnormal glucose 2008   A1c 6.1%  . Positive PPD 1991   no INH prophylaxis due to age  . PVD (peripheral vascular disease) ([place])   . Pyloric stenosis   . Renal insufficiency    Ryan Cortez    Past Surgical History:  Procedure  Laterality Date  . CHOLECYSTECTOMY    . COLONOSCOPY W/ POLYPECTOMY    . LIVER BIOPSY    . UPPER GASTROINTESTINAL ENDOSCOPY  02/2007   Ryan. Oretha Cortez; gastritis,incomplete pyloric stricture with partial outlet obstruction    Social History   Socioeconomic History  . Marital status: Divorced    Spouse name: Not on file  . Number of children: Not on file  . Years of education: Not on file  . Highest education level: Not on file  Occupational History  . Occupation: Retired  Tobacco Use  . Smoking status: Former Smoker    Quit date: 11/11/1975    Years since quitting: 44.8  . Smokeless tobacco: Never Used  . Tobacco comment: smoked age [age],up to 1/2 ppd  Vaping Use  . Vaping Use: Never used  Substance and Sexual Activity  . Alcohol use: No  . Drug use: No  . Sexual activity: Never  Other Topics Concern  . Not on file  Social History Narrative   [employer]   Social Determinants of Health   Financial Resource Strain:   . Difficulty of Paying Living Expenses: Not on file  Food Insecurity:   . Worried About Charity fundraiser in the Last Year: Not on file  . Ran Out of Food in the Last Year: Not on file  Transportation  Needs:   . Lack of Transportation (Medical): Not on file  . Lack of Transportation (Non-Medical): Not on file  Physical Activity:   . Days of Exercise per Week: Not on file  . Minutes of Exercise per Session: Not on file  Stress:   . Feeling of Stress : Not on file  Social Connections:   . Frequency of Communication with Friends and Family: Not on file  . Frequency of Social Gatherings with Friends and Family: Not on file  . Attends Religious Services: Not on file  . Active Member of Clubs or Organizations: Not on file  . Attends Archivist Meetings: Not on file  . Marital Status: Not on file  Intimate Partner Violence:   . Fear of Current or Ex-Partner: Not on file  . Emotionally Abused: Not on file  . Physically Abused:  Not on file  . Sexually Abused: Not on file    Family History  Problem Relation Age of Onset  . Stroke Father 8  . Hypertension Mother   . Heart attack Mother 18  . Cancer Maternal Aunt        ? primary  . Alcohol abuse Brother   . Diabetes Neg Hx      Review of Systems  Constitutional: Negative.  Negative for chills and fever.  HENT: Negative.  Negative for congestion and sore throat.   Respiratory: Negative.  Negative for cough and shortness of breath.   Cardiovascular: Negative.  Negative for chest pain and palpitations.  Gastrointestinal: Negative.  Negative for abdominal pain, diarrhea, nausea and vomiting.  Genitourinary: Negative.  Negative for dysuria and hematuria.  Skin: Negative.  Negative for rash.  Neurological: Negative.  Negative for dizziness and headaches.  All other systems reviewed and are negative.   Today's Vitals   09/04/20 1000  BP: (!) 140/58  Pulse: 83  Resp: 16  Temp: 98.2 F (36.8 C)  TempSrc: Temporal  SpO2: 100%  Weight: 115 lb (52.2 kg)  Height: 5\' 9"  (1.753 m)   Body mass index is 16.98 kg/m. Wt Readings from Last 3 Encounters:  09/04/20 115 lb (52.2 kg)  03/06/20 122 lb 12.8 oz (55.7 kg)  08/24/19 120 lb 12.8 oz (54.8 kg)    Physical Exam Vitals reviewed.  Constitutional:      Appearance: Normal appearance.  HENT:     Head: Normocephalic.  Eyes:     Extraocular Movements: Extraocular movements intact.     Pupils: Pupils are equal, round, and reactive to light.  Cardiovascular:     Rate and Rhythm: Normal rate.     Pulses: Normal pulses.  Pulmonary:     Effort: Pulmonary effort is normal.     Breath sounds: Normal breath sounds.  Skin:    General: Skin is warm and dry.  Neurological:     General: No focal deficit present.     Mental Status: He is alert and oriented to person, place, and time.  Psychiatric:        Mood and Affect: Mood normal.        Behavior: Behavior normal.      ASSESSMENT & PLAN: Clinically  stable.  No medical concerns identified during this visit.  Continue present medications and follow-up with PCP Ryan. Edrick Cortez.  Ryan Cortez was seen today for hypertension and medication refill.  Diagnoses and all orders for this visit:  Essential hypertension -     Comprehensive metabolic panel -     Lipid panel  Stage  3b chronic kidney disease (HCC)  Benign prostatic hyperplasia, unspecified whether lower urinary tract symptoms present  Other orders -     finasteride (PROSCAR) 5 MG tablet; Take 1 tablet (5 mg total) by mouth daily. -     tamsulosin (FLOMAX) 0.4 MG CAPS capsule; Take 1 capsule (0.4 mg total) by mouth daily.    Patient Instructions       If you have lab work done today you will be contacted with your lab results within the next 2 weeks.  If you have not heard from [place] then please contact us. The fastest way to get your results is to register for My Chart.   IF you received an x-ray today, you will receive an invoice from [facility]. Please contact [facility] at [phone] with questions or concerns regarding your invoice.   IF you received labwork today, you will receive an invoice from [facility]. Please contact [facility] at [phone] with questions or concerns regarding your invoice.   Our billing staff will not be able to assist you with questions regarding bills from these companies.  You will be contacted with the lab results as soon as they are available. The fastest way to get your results is to activate your My Chart account. Instructions are located on the last page of this paperwork. If you have not heard from [place] regarding the results in 2 weeks, please contact this office.     Health Maintenance After Age 12 After age 75, you are at a higher risk for certain long-term diseases and infections as well as injuries from falls. Falls are a major cause of broken bones and head injuries in people who are older than age 16. Getting regular  preventive care can help to keep you healthy and well. Preventive care includes getting regular testing and making lifestyle changes as recommended by your health care provider. Talk with your health care provider about:  Which screenings and tests you should have. A screening is a test that checks for a disease when you have no symptoms.  A diet and exercise plan that is right for you. What should I know about screenings and tests to prevent falls? Screening and testing are the best ways to find a health problem early. Early diagnosis and treatment give you the best chance of managing medical conditions that are common after age 74. Certain conditions and lifestyle choices may make you more likely to have a fall. Your health care provider may recommend:  Regular vision checks. Poor vision and conditions such as cataracts can make you more likely to have a fall. If you wear glasses, make sure to get your prescription updated if your vision changes.  Medicine review. Work with your health care provider to regularly review all of the medicines you are taking, including over-the-counter medicines. Ask your health care provider about any side effects that may make you more likely to have a fall. Tell your health care provider if any medicines that you take make you feel dizzy or sleepy.  Osteoporosis screening. Osteoporosis is a condition that causes the bones to get weaker. This can make the bones weak and cause them to break more easily.  Blood pressure screening. Blood pressure changes and medicines to control blood pressure can make you feel dizzy.  Strength and balance checks. Your health care provider may recommend certain tests to check your strength and balance while standing, walking, or changing positions.  Foot health exam. Foot pain and numbness, as well  as not wearing proper footwear, can make you more likely to have a fall.  Depression screening. You may be more likely to have a fall if  you have a fear of falling, feel emotionally low, or feel unable to do activities that you used to do.  Alcohol use screening. Using too much alcohol can affect your balance and may make you more likely to have a fall. What actions can I take to lower my risk of falls? General instructions  Talk with your health care provider about your risks for falling. Tell your health care provider if: ? You fall. Be sure to tell your health care provider about all falls, even ones that seem minor. ? You feel dizzy, sleepy, or off-balance.  Take over-the-counter and prescription medicines only as told by your health care provider. These include any supplements.  Eat a healthy diet and maintain a healthy weight. A healthy diet includes low-fat dairy products, low-fat (lean) meats, and fiber from whole grains, beans, and lots of fruits and vegetables. Home safety  Remove any tripping hazards, such as rugs, cords, and clutter.  Install safety equipment such as grab bars in bathrooms and safety rails on stairs.  Keep rooms and walkways well-lit. Activity   Follow a regular exercise program to stay fit. This will help you maintain your balance. Ask your health care provider what types of exercise are appropriate for you.  If you need a cane or walker, use it as recommended by your health care provider.  Wear supportive shoes that have nonskid soles. Lifestyle  Do not drink alcohol if your health care provider tells you not to drink.  If you drink alcohol, limit how much you have: ? 0-1 drink a day for women. ? 0-2 drinks a day for men.  Be aware of how much alcohol is in your drink. In the [place] one drink equals one typical bottle of beer (12 oz), one-half glass of wine (5 oz), or one shot of hard liquor (1 oz).  Do not use any products that contain nicotine or tobacco, such as cigarettes and e-cigarettes. If you need help quitting, ask your health care provider. Summary  Having a healthy  lifestyle and getting preventive care can help to protect your health and wellness after age 51.  Screening and testing are the best way to find a health problem early and help you avoid having a fall. Early diagnosis and treatment give you the best chance for managing medical conditions that are more common for people who are older than age 58.  Falls are a major cause of broken bones and head injuries in people who are older than age 48. Take precautions to prevent a fall at home.  Work with your health care provider to learn what changes you can make to improve your health and wellness and to prevent falls. This information is not intended to replace advice given to you by your health care provider. Make sure you discuss any questions you have with your health care provider. Document Revised: 02/17/2019 Document Reviewed: 09/09/2017 Elsevier Patient Education  2020 Elsevier Inc.      Agustina Caroli, MD [facility]

## 2020-09-05 LAB — COMPREHENSIVE METABOLIC PANEL
ALT: 18 IU/L (ref 0–44)
AST: 22 IU/L (ref 0–40)
Albumin/Globulin Ratio: 1.6 (ref 1.2–2.2)
Albumin: 4.6 g/dL (ref 3.5–4.6)
Alkaline Phosphatase: 34 IU/L — ABNORMAL LOW (ref 44–121)
BUN/Creatinine Ratio: 14 (ref 10–24)
BUN: 44 mg/dL — ABNORMAL HIGH (ref 10–36)
Bilirubin Total: 0.4 mg/dL (ref 0.0–1.2)
CO2: 22 mmol/L (ref 20–29)
Calcium: 9.7 mg/dL (ref 8.6–10.2)
Chloride: 103 mmol/L (ref 96–106)
Creatinine, Ser: 3.1 mg/dL (ref 0.76–1.27)
GFR calc Af Amer: 19 mL/min/{1.73_m2} — ABNORMAL LOW (ref >59)
GFR calc non Af Amer: 17 mL/min/{1.73_m2} — ABNORMAL LOW (ref >59)
Globulin, Total: 2.8 g/dL (ref 1.5–4.5)
Glucose: 101 mg/dL — ABNORMAL HIGH (ref 65–99)
Potassium: 4.3 mmol/L (ref 3.5–5.2)
Sodium: 141 mmol/L (ref 134–144)
Total Protein: 7.4 g/dL (ref 6.0–8.5)

## 2020-09-05 LAB — LIPID PANEL
Chol/HDL Ratio: 3 ratio (ref 0.0–5.0)
Cholesterol, Total: 251 mg/dL — ABNORMAL HIGH (ref 100–199)
HDL: 83 mg/dL (ref >39)
LDL Chol Calc (NIH): 149 mg/dL — ABNORMAL HIGH (ref 0–99)
Triglycerides: 108 mg/dL (ref 0–149)
VLDL Cholesterol Cal: 19 mg/dL (ref 5–40)

## 2020-10-25 DIAGNOSIS — N184 Chronic kidney disease, stage 4 (severe): Secondary | ICD-10-CM | POA: Diagnosis not present

## 2020-10-25 DIAGNOSIS — I739 Peripheral vascular disease, unspecified: Secondary | ICD-10-CM | POA: Diagnosis not present

## 2020-10-25 DIAGNOSIS — I129 Hypertensive chronic kidney disease with stage 1 through stage 4 chronic kidney disease, or unspecified chronic kidney disease: Secondary | ICD-10-CM | POA: Diagnosis not present

## 2020-10-25 DIAGNOSIS — N2581 Secondary hyperparathyroidism of renal origin: Secondary | ICD-10-CM | POA: Diagnosis not present

## 2020-10-25 DIAGNOSIS — R7302 Impaired glucose tolerance (oral): Secondary | ICD-10-CM | POA: Diagnosis not present

## 2020-10-25 DIAGNOSIS — N189 Chronic kidney disease, unspecified: Secondary | ICD-10-CM | POA: Diagnosis not present

## 2020-10-25 DIAGNOSIS — D631 Anemia in chronic kidney disease: Secondary | ICD-10-CM | POA: Diagnosis not present

## 2020-10-27 ENCOUNTER — Other Ambulatory Visit: Payer: Self-pay | Admitting: Emergency Medicine

## 2020-10-27 DIAGNOSIS — I10 Essential (primary) hypertension: Secondary | ICD-10-CM

## 2020-10-27 DIAGNOSIS — R600 Localized edema: Secondary | ICD-10-CM

## 2020-10-27 NOTE — Telephone Encounter (Signed)
Requested Prescriptions  Pending Prescriptions Disp Refills  . losartan (COZAAR) 100 MG tablet [Pharmacy Med Name: LOSARTAN POTASSIUM 100 MG TAB] 90 tablet 1    Sig: TAKE 1 TABLET BY MOUTH EVERY DAY     Cardiovascular:  Angiotensin Receptor Blockers Failed - 10/27/2020  8:57 AM      Failed - Cr in normal range and within 180 days    Creat  Date Value Ref Range Status  05/10/2019 2.66 (H) 0.70 - 1.11 mg/dL Final    Comment:    For patients >84 years of age, the reference limit for Creatinine is approximately 13% higher for people identified as African-American. .    Creatinine, Ser  Date Value Ref Range Status  09/04/2020 3.10 (HH) 0.76 - 1.27 mg/dL Final    Comment:                      Client Requested [place]   Creatinine,U  Date Value Ref Range Status  01/20/2007 226.0 mg/dL Final         Failed - Last BP in normal range    BP Readings from Last 1 Encounters:  09/04/20 (!) 140/58         Passed - K in normal range and within 180 days    Potassium  Date Value Ref Range Status  09/04/2020 4.3 3.5 - 5.2 mmol/L Final         Passed - Patient is not pregnant      Passed - Valid encounter within last 6 months    Recent Outpatient Visits          1 month ago Essential hypertension   [facility] [place], [place], [place]   7 months ago Bilateral hearing loss, unspecified hearing loss type   [facility] at [facility], [place], [place]   1 year ago Essential hypertension   [facility] at [place], [place], [place]             . furosemide (LASIX) 40 MG tablet [Pharmacy Med Name: FUROSEMIDE 40 MG TABLET] 90 tablet 1    Sig: TAKE 1 TABLET BY MOUTH EVERY DAY     Cardiovascular:  Diuretics - Loop Failed - 10/27/2020  8:57 AM      Failed - Cr in normal range and within 360 days    Creat  Date Value Ref Range Status  05/10/2019 2.66 (H) 0.70 - 1.11 mg/dL Final    Comment:    For patients >8 years of age, the reference limit for Creatinine is  approximately 13% higher for people identified as African-American. .    Creatinine, Ser  Date Value Ref Range Status  09/04/2020 3.10 (HH) 0.76 - 1.27 mg/dL Final    Comment:                      Client Requested [place]   Creatinine,U  Date Value Ref Range Status  01/20/2007 226.0 mg/dL Final         Failed - Last BP in normal range    BP Readings from Last 1 Encounters:  09/04/20 (!) 140/58         Passed - K in normal range and within 360 days    Potassium  Date Value Ref Range Status  09/04/2020 4.3 3.5 - 5.2 mmol/L Final         Passed - Ca in normal range and within 360 days    Calcium  Date Value Ref Range Status  09/04/2020 9.7 8.6 - 10.2 mg/dL Final         Passed - Na in normal range and within 360 days    Sodium  Date Value Ref Range Status  09/04/2020 141 134 - 144 mmol/L Final         Passed - Valid encounter within last 6 months    Recent Outpatient Visits          1 month ago Essential hypertension   [facility] at [place], [place], MD   7 months ago Bilateral hearing loss, unspecified hearing loss type   [facility] Care at [facility], [place] Bloomer, MD   1 year ago Essential hypertension   [facility] at [facility], [place], [place]

## 2020-11-06 ENCOUNTER — Other Ambulatory Visit (HOSPITAL_COMMUNITY): Payer: Self-pay | Admitting: *Deleted

## 2020-11-07 ENCOUNTER — Inpatient Hospital Stay (HOSPITAL_COMMUNITY): Admission: RE | Admit: 2020-11-07 | Payer: Medicare Other | Source: Ambulatory Visit

## 2020-11-14 ENCOUNTER — Encounter (HOSPITAL_COMMUNITY): Payer: Medicare Other

## 2020-11-16 ENCOUNTER — Other Ambulatory Visit: Payer: Self-pay

## 2020-11-16 DIAGNOSIS — I10 Essential (primary) hypertension: Secondary | ICD-10-CM

## 2020-11-16 MED ORDER — AMLODIPINE BESYLATE 10 MG PO TABS: 10.0000 mg | ORAL_TABLET | Freq: Every day | ORAL | 0 refills | Status: DC

## 2021-02-12 ENCOUNTER — Other Ambulatory Visit: Payer: Self-pay | Admitting: Emergency Medicine

## 2021-02-12 DIAGNOSIS — I10 Essential (primary) hypertension: Secondary | ICD-10-CM

## 2021-04-23 ENCOUNTER — Other Ambulatory Visit: Payer: Self-pay | Admitting: Emergency Medicine

## 2021-04-23 DIAGNOSIS — R600 Localized edema: Secondary | ICD-10-CM

## 2021-04-23 DIAGNOSIS — I10 Essential (primary) hypertension: Secondary | ICD-10-CM

## 2021-04-24 ENCOUNTER — Ambulatory Visit: Payer: Medicare Other | Admitting: Physician Assistant

## 2021-04-24 ENCOUNTER — Other Ambulatory Visit: Payer: Self-pay

## 2021-04-24 VITALS — BP 155/64 | HR 63 | Temp 98.2°F | Resp 18 | Ht 69.0 in | Wt 115.0 lb

## 2021-04-24 DIAGNOSIS — H903 Sensorineural hearing loss, bilateral: Secondary | ICD-10-CM

## 2021-04-24 DIAGNOSIS — H6122 Impacted cerumen, left ear: Secondary | ICD-10-CM | POA: Insufficient documentation

## 2021-04-24 NOTE — Progress Notes (Signed)
Patient is here for ear concerns. Patients family reports hard of hearing for about year. Patient reports feeling muffled with intermittent release. Patient has not eaten today. Patient has taken medication around 8am. Patient states left hear is worse.

## 2021-04-24 NOTE — Progress Notes (Signed)
New Patient Office Visit  Subjective:  Patient ID: Ryan Cortez, male    DOB: [birth year]  Age: 87 y.o. MRN: [id number]  CC:  Chief Complaint  Patient presents with   left ear pain    Decreased hearing     HPI Ryan Cortez presents with friend of family, Lorraine Lax, who states that she also acts as a caregiver.  She does provide majority of history due to patient's difficulty hearing.  Reports that he has been having difficulty hearing, states that she feels it is getting worse.  States that she is unsure if this has been previously evaluated.  Does endorse that he has been complaining of discomfort in his left ear for the past couple of weeks..  Denies any type of URI symptoms.  No other concerns at this time  Past Medical History:  Diagnosis Date   Anemia    iron Rxed by Dr Ardis Hughs   Arthritis    BPH (benign prostatic hyperplasia)    Diabetes ([place])    Fasting hyperglycemia    Hyperlipidemia    Hypertension    Other abnormal glucose 2008   A1c 6.1%   Positive PPD 1991   no INH prophylaxis due to age   PVD (peripheral vascular disease) ([place])    Pyloric stenosis    Renal insufficiency    Dr Justin Mend    Past Surgical History:  Procedure Laterality Date   CHOLECYSTECTOMY     COLONOSCOPY W/ POLYPECTOMY     LIVER BIOPSY     UPPER GASTROINTESTINAL ENDOSCOPY  02/2007   Dr. Oretha Caprice; gastritis,incomplete pyloric stricture with partial outlet obstruction    Family History  Problem Relation Age of Onset   Stroke Father 72   Hypertension Mother    Heart attack Mother 33   Cancer Maternal Aunt        ? primary   Alcohol abuse Brother    Diabetes Neg Hx     Social History   Socioeconomic History   Marital status: Divorced    Spouse name: Not on file   Number of children: Not on file   Years of education: Not on file   Highest education level: Not on file  Occupational History   Occupation: Retired  Tobacco Use   Smoking status: Former    Pack years: 0.00     Types: Cigarettes    Quit date: 11/11/1975    Years since quitting: 45.4   Smokeless tobacco: Never   Tobacco comments:    smoked age [age],up to 1/2 ppd  Vaping Use   Vaping Use: Never used  Substance and Sexual Activity   Alcohol use: No   Drug use: No   Sexual activity: Never  Other Topics Concern   Not on file  Social History Narrative   [employer]   Social Determinants of Health   Financial Resource Strain: Not on file  Food Insecurity: Not on file  Transportation Needs: Not on file  Physical Activity: Not on file  Stress: Not on file  Social Connections: Not on file  Intimate Partner Violence: Not on file    ROS Review of Systems  Constitutional:  Negative for chills and fever.  HENT:  Positive for ear pain. Negative for congestion, ear discharge, sneezing and sore throat.   Eyes: Negative.   Respiratory:  Negative for shortness of breath.   Cardiovascular:  Negative for chest pain.  Gastrointestinal: Negative.   Endocrine: Negative.   Genitourinary: Negative.   Musculoskeletal:  Negative.   Skin: Negative.   Allergic/Immunologic: Negative.   Neurological: Negative.   Hematological: Negative.   Psychiatric/Behavioral: Negative.     Objective:   Today's Vitals: BP (!) 155/64 (BP Location: Left Arm, Patient Position: Sitting, Cuff Size: Normal)   Pulse 63   Temp 98.2 F (36.8 C) (Oral)   Resp 18   Ht 5\' 9"  (1.753 m)   Wt 115 lb (52.2 kg)   BMI 16.98 kg/m   Physical Exam Vitals and nursing note reviewed.  Constitutional:      General: He is not in acute distress.    Appearance: Normal appearance.     Comments: Very little communication  HENT:     Head: Normocephalic and atraumatic.     Right Ear: Tympanic membrane, ear canal and external ear normal. Decreased hearing noted. There is no impacted cerumen.     Left Ear: Tympanic membrane and external ear normal. Decreased hearing noted. There is impacted cerumen.     Ears:      Comments: TM WNL after irrigation    Nose: Nose normal.     Mouth/Throat:     Mouth: Mucous membranes are moist.     Pharynx: Oropharynx is clear.  Eyes:     Extraocular Movements: Extraocular movements intact.     Conjunctiva/sclera: Conjunctivae normal.     Pupils: Pupils are equal, round, and reactive to light.  Cardiovascular:     Rate and Rhythm: Normal rate and regular rhythm.     Pulses: Normal pulses.     Heart sounds: Normal heart sounds.  Pulmonary:     Effort: Pulmonary effort is normal.     Breath sounds: Normal breath sounds.  Musculoskeletal:        General: Normal range of motion.     Cervical back: Normal range of motion and neck supple.  Skin:    General: Skin is warm and dry.  Neurological:     Mental Status: He is alert.     Comments: Unable to evaluate due to lack of communication  Psychiatric:     Comments: Unable to evaluate due to lack of communication    Assessment & Plan:   Problem List Items Addressed This Visit       Nervous and Auditory   Sensorineural hearing loss (SNHL) of both ears - Primary   Relevant Orders   Ambulatory referral to Audiology   Impacted cerumen of left ear    Outpatient Encounter Medications as of 04/24/2021  Medication Sig   amLODipine (NORVASC) 10 MG tablet TAKE 1 TABLET BY MOUTH EVERY DAY   finasteride (PROSCAR) 5 MG tablet Take 1 tablet (5 mg total) by mouth daily.   furosemide (LASIX) 40 MG tablet TAKE 1 TABLET BY MOUTH EVERY DAY   losartan (COZAAR) 100 MG tablet TAKE 1 TABLET BY MOUTH EVERY DAY   tamsulosin (FLOMAX) 0.4 MG CAPS capsule Take 1 capsule (0.4 mg total) by mouth daily.   No facility-administered encounter medications on file as of 04/24/2021.  1. Sensorineural hearing loss (SNHL) of both ears  Was seen by audiology in 12/2018  Recommendations: Recommend repeat test in conjunction with otologic care, sooner with concerns of sudden hearing loss, deteriorating hearing, or new or worsening  tinnitus. Recommend proper use of adequate hearing protection in hazardous noise. Recommend trial with amplification, pending medical clearance. He may qualify for benefits at the [place]. A copy of his hearing test was provided for him.  Please feel free to contact me at  [phone] if there are any questions regarding Nickolaos's hearing. Thank you for the referral.  Newt Lukes, AuD CCC-A  01/04/2019 - Ambulatory referral to Audiology  2. Impacted cerumen of left ear Irrigation performed, no improvement in hearing.  Red flags given for prompt reevaluation.  I have reviewed the patient's medical history (PMH, PSH, Social History, Family History, Medications, and allergies) , and have been updated if relevant. I spent 23 minutes reviewing chart and  face to face time with patient.     Follow-up: Return if symptoms worsen or fail to improve.   Loraine Grip Mayers, PA-C

## 2021-04-24 NOTE — Patient Instructions (Signed)
I did start a referral for him to be seen again by audiology for further evaluation.  Please let us know if there is anything else we can do for you.  Kennieth Rad, PA-C Physician Assistant [place] [email or web address]   Earwax Buildup, Adult The ears produce a substance called earwax that helps keep bacteria out of the ear and protects the skin in the ear canal. Occasionally, earwax can build upin the ear and cause discomfort or hearing loss. What are the causes? This condition is caused by a buildup of earwax. Ear canals are self-cleaning. Ear wax is made in the outer part of the ear canal and generally falls out insmall amounts over time. When the self-cleaning mechanism is not working, earwax builds up and can cause decreased hearing and discomfort. Attempting to clean ears with cotton swabs can push the earwax deep into the ear canal and cause decreased hearing andpain. What increases the risk? This condition is more likely to develop in people who: Clean their ears often with cotton swabs. Pick at their ears. Use earplugs or in-ear headphones often, or wear hearing aids. The following factors may also make you more likely to develop this condition: Being male. Being of older age. Naturally producing more earwax. Having narrow ear canals. Having earwax that is overly thick or sticky. Having excess hair in the ear canal. Having eczema. Being dehydrated. What are the signs or symptoms? Symptoms of this condition include: Reduced or muffled hearing. A feeling of fullness in the ear or feeling that the ear is plugged. Fluid coming from the ear. Ear pain or an itchy ear. Ringing in the ear. Coughing. Balance problems. An obvious piece of earwax that can be seen inside the ear canal. How is this diagnosed? This condition may be diagnosed based on: Your symptoms. Your medical history. An ear exam. During the exam,  your health care provider will look into your ear with an instrument called an otoscope. You may have tests, including a hearing test. How is this treated? This condition may be treated by: Using ear drops to soften the earwax. Having the earwax removed by a health care provider. The health care provider may: Flush the ear with water. Use an instrument that has a loop on the end (curette). Use a suction device. Having surgery to remove the wax buildup. This may be done in severe cases. Follow these instructions at home:  Take over-the-counter and prescription medicines only as told by your health care provider. Do not put any objects, including cotton swabs, into your ear. You can clean the opening of your ear canal with a washcloth or facial tissue. Follow instructions from your health care provider about cleaning your ears. Do not overclean your ears. Drink enough fluid to keep your urine pale yellow. This will help to thin the earwax. Keep all follow-up visits as told. If earwax builds up in your ears often or if you use hearing aids, consider seeing your health care provider for routine, preventive ear cleanings. Ask your health care provider how often you should schedule your cleanings. If you have hearing aids, clean them according to instructions from the manufacturer and your health care provider. Contact a health care provider if: You have ear pain. You develop a fever. You have pus or other fluid coming from your ear. You have hearing loss. You have ringing in your ears that does not go away. You feel like the room is spinning (vertigo). Your symptoms do not  improve with treatment. Get help right away if: You have bleeding from the affected ear. You have severe ear pain. Summary Earwax can build up in the ear and cause discomfort or hearing loss. The most common symptoms of this condition include reduced or muffled hearing, a feeling of fullness in the ear, or feeling that  the ear is plugged. This condition may be diagnosed based on your symptoms, your medical history, and an ear exam. This condition may be treated by using ear drops to soften the earwax or by having the earwax removed by a health care provider. Do not put any objects, including cotton swabs, into your ear. You can clean the opening of your ear canal with a washcloth or facial tissue. This information is not intended to replace advice given to you by your health care provider. Make sure you discuss any questions you have with your healthcare provider. Document Revised: 02/14/2020 Document Reviewed: 02/14/2020 Elsevier Patient Education  [place].  Hearing Loss Hearing loss is a partial or total loss of the ability to hear. This can betemporary or permanent, and it can happen in one or both ears. Medical care is necessary to treat hearing loss properly and to prevent the condition from getting worse. Your hearing may partially or completely come back, depending on what caused your hearing loss and how severe it is. In somecases, hearing loss is permanent. What are the causes? Common causes of hearing loss include: Too much wax in the ear canal. Infection of the ear canal or middle ear. Fluid in the middle ear. Injury to the ear or surrounding area. An object stuck in the ear. A history of prolonged exposure to loud sounds, such as music. Less common causes of hearing loss include: Tumors in the ear. Viral or bacterial infections, such as meningitis. A hole in the eardrum (perforated eardrum). Problems with the hearing nerve that sends signals between the brain and the ear. Certain medicines. What are the signs or symptoms? Symptoms of this condition may include: Difficulty telling the difference between sounds. Difficulty following a conversation when there is background noise. Lack of response to sounds in your environment. This may be most noticeable when you do not respond to  startling sounds. Needing to turn up the volume on the television, radio, or other devices. Ringing in the ears. Dizziness. How is this diagnosed? This condition is diagnosed based on: A physical exam. A hearing test (audiometry). The audiometry test will be performed by a hearing specialist (audiologist). You may also be referred to an ear, nose, and throat (ENT) specialist (otolaryngologist). How is this treated? Treatment for hearing loss may include: Ear wax removal. Medicines to treat or prevent infection (antibiotics). Medicines to reduce inflammation (corticosteroids). Hearing aids for hearing loss related to nerve damage. Follow these instructions at home: If you were prescribed an antibiotic medicine, take it as told by your health care provider. Do not stop taking the antibiotic even if you start to feel better. Take over-the-counter and prescription medicines only as told by your health care provider. Avoid loud noises. Return to your normal activities as told by your health care provider. Ask your health care provider what activities are safe for you. Keep all follow-up visits as told by your health care provider. This is important. Contact a health care provider if: You feel dizzy. You develop new symptoms. You vomit or feel nauseous. You have a fever. Get help right away if: You develop sudden changes in your vision.  You have severe ear pain. You have new or increased weakness. You have a severe headache. Summary Hearing loss is a decreased ability to hear sounds around you. It can be temporary or permanent. Treatment will depend on the cause of your hearing loss. It may include ear wax removal, medicines, or a hearing aid. Your hearing may partially or completely come back, depending on what caused your hearing loss and how severe it is. Keep all follow-up visits as told by your health care provider. This is important. This information is not intended to replace  advice given to you by your health care provider. Make sure you discuss any questions you have with your healthcare provider. Document Revised: 07/27/2018 Document Reviewed: 07/27/2018 Elsevier Patient Education  [place].

## 2021-07-19 ENCOUNTER — Other Ambulatory Visit: Payer: Self-pay | Admitting: Emergency Medicine

## 2021-07-19 DIAGNOSIS — I10 Essential (primary) hypertension: Secondary | ICD-10-CM

## 2021-07-30 DIAGNOSIS — H6123 Impacted cerumen, bilateral: Secondary | ICD-10-CM | POA: Diagnosis not present

## 2021-07-30 DIAGNOSIS — H903 Sensorineural hearing loss, bilateral: Secondary | ICD-10-CM | POA: Diagnosis not present

## 2021-08-08 DIAGNOSIS — H40013 Open angle with borderline findings, low risk, bilateral: Secondary | ICD-10-CM | POA: Diagnosis not present

## 2021-08-08 DIAGNOSIS — H2513 Age-related nuclear cataract, bilateral: Secondary | ICD-10-CM | POA: Diagnosis not present

## 2021-08-08 DIAGNOSIS — H40033 Anatomical narrow angle, bilateral: Secondary | ICD-10-CM | POA: Diagnosis not present

## 2021-08-08 DIAGNOSIS — H25013 Cortical age-related cataract, bilateral: Secondary | ICD-10-CM | POA: Diagnosis not present

## 2021-08-28 DIAGNOSIS — H40013 Open angle with borderline findings, low risk, bilateral: Secondary | ICD-10-CM | POA: Diagnosis not present

## 2021-08-28 DIAGNOSIS — H40033 Anatomical narrow angle, bilateral: Secondary | ICD-10-CM | POA: Diagnosis not present

## 2021-08-28 DIAGNOSIS — H2513 Age-related nuclear cataract, bilateral: Secondary | ICD-10-CM | POA: Diagnosis not present

## 2021-08-28 DIAGNOSIS — H25013 Cortical age-related cataract, bilateral: Secondary | ICD-10-CM | POA: Diagnosis not present

## 2021-08-28 DIAGNOSIS — H40012 Open angle with borderline findings, low risk, left eye: Secondary | ICD-10-CM | POA: Diagnosis not present

## 2021-08-28 DIAGNOSIS — H40032 Anatomical narrow angle, left eye: Secondary | ICD-10-CM | POA: Diagnosis not present

## 2021-09-17 DIAGNOSIS — H6123 Impacted cerumen, bilateral: Secondary | ICD-10-CM | POA: Diagnosis not present

## 2021-09-23 DIAGNOSIS — H40031 Anatomical narrow angle, right eye: Secondary | ICD-10-CM | POA: Diagnosis not present

## 2021-10-07 ENCOUNTER — Inpatient Hospital Stay (HOSPITAL_COMMUNITY): Payer: Medicare Other

## 2021-10-07 ENCOUNTER — Inpatient Hospital Stay (HOSPITAL_COMMUNITY)
Admission: EM | Admit: 2021-10-07 | Discharge: 2021-10-16 | DRG: 064 | Disposition: A | Discharge status: Hospice | Payer: Medicare Other | Attending: Family Medicine | Admitting: Family Medicine

## 2021-10-07 ENCOUNTER — Emergency Department (HOSPITAL_COMMUNITY): Payer: Medicare Other

## 2021-10-07 DIAGNOSIS — N179 Acute kidney failure, unspecified: Secondary | ICD-10-CM | POA: Diagnosis not present

## 2021-10-07 DIAGNOSIS — I959 Hypotension, unspecified: Secondary | ICD-10-CM | POA: Diagnosis not present

## 2021-10-07 DIAGNOSIS — S065XAA Traumatic subdural hemorrhage with loss of consciousness status unknown, initial encounter: Secondary | ICD-10-CM | POA: Diagnosis not present

## 2021-10-07 DIAGNOSIS — L8990 Pressure ulcer of unspecified site, unspecified stage: Secondary | ICD-10-CM | POA: Insufficient documentation

## 2021-10-07 DIAGNOSIS — W19XXXA Unspecified fall, initial encounter: Secondary | ICD-10-CM

## 2021-10-07 DIAGNOSIS — Z931 Gastrostomy status: Secondary | ICD-10-CM

## 2021-10-07 DIAGNOSIS — J9601 Acute respiratory failure with hypoxia: Secondary | ICD-10-CM | POA: Insufficient documentation

## 2021-10-07 DIAGNOSIS — Z4659 Encounter for fitting and adjustment of other gastrointestinal appliance and device: Secondary | ICD-10-CM

## 2021-10-07 DIAGNOSIS — E875 Hyperkalemia: Secondary | ICD-10-CM | POA: Insufficient documentation

## 2021-10-07 DIAGNOSIS — E43 Unspecified severe protein-calorie malnutrition: Secondary | ICD-10-CM | POA: Insufficient documentation

## 2021-10-07 DIAGNOSIS — Z8249 Family history of ischemic heart disease and other diseases of the circulatory system: Secondary | ICD-10-CM

## 2021-10-07 DIAGNOSIS — F05 Delirium due to known physiological condition: Secondary | ICD-10-CM | POA: Diagnosis present

## 2021-10-07 DIAGNOSIS — Z681 Body mass index (BMI) 19 or less, adult: Secondary | ICD-10-CM

## 2021-10-07 DIAGNOSIS — D696 Thrombocytopenia, unspecified: Secondary | ICD-10-CM | POA: Diagnosis present

## 2021-10-07 DIAGNOSIS — E870 Hyperosmolality and hypernatremia: Secondary | ICD-10-CM | POA: Diagnosis present

## 2021-10-07 DIAGNOSIS — I700 Atherosclerosis of aorta: Secondary | ICD-10-CM | POA: Diagnosis present

## 2021-10-07 DIAGNOSIS — R627 Adult failure to thrive: Secondary | ICD-10-CM | POA: Diagnosis not present

## 2021-10-07 DIAGNOSIS — Z4682 Encounter for fitting and adjustment of non-vascular catheter: Secondary | ICD-10-CM | POA: Diagnosis not present

## 2021-10-07 DIAGNOSIS — I440 Atrioventricular block, first degree: Secondary | ICD-10-CM | POA: Diagnosis present

## 2021-10-07 DIAGNOSIS — D631 Anemia in chronic kidney disease: Secondary | ICD-10-CM | POA: Diagnosis present

## 2021-10-07 DIAGNOSIS — G9341 Metabolic encephalopathy: Secondary | ICD-10-CM | POA: Diagnosis present

## 2021-10-07 DIAGNOSIS — E874 Mixed disorder of acid-base balance: Secondary | ICD-10-CM | POA: Diagnosis present

## 2021-10-07 DIAGNOSIS — I129 Hypertensive chronic kidney disease with stage 1 through stage 4 chronic kidney disease, or unspecified chronic kidney disease: Secondary | ICD-10-CM | POA: Diagnosis present

## 2021-10-07 DIAGNOSIS — I9589 Other hypotension: Secondary | ICD-10-CM | POA: Diagnosis not present

## 2021-10-07 DIAGNOSIS — E871 Hypo-osmolality and hyponatremia: Secondary | ICD-10-CM | POA: Diagnosis not present

## 2021-10-07 DIAGNOSIS — E1110 Type 2 diabetes mellitus with ketoacidosis without coma: Secondary | ICD-10-CM | POA: Diagnosis not present

## 2021-10-07 DIAGNOSIS — Z20822 Contact with and (suspected) exposure to covid-19: Secondary | ICD-10-CM | POA: Diagnosis present

## 2021-10-07 DIAGNOSIS — E861 Hypovolemia: Secondary | ICD-10-CM | POA: Diagnosis present

## 2021-10-07 DIAGNOSIS — Z87891 Personal history of nicotine dependence: Secondary | ICD-10-CM

## 2021-10-07 DIAGNOSIS — Z7401 Bed confinement status: Secondary | ICD-10-CM | POA: Diagnosis not present

## 2021-10-07 DIAGNOSIS — W1830XA Fall on same level, unspecified, initial encounter: Secondary | ICD-10-CM | POA: Diagnosis present

## 2021-10-07 DIAGNOSIS — Z9911 Dependence on respirator [ventilator] status: Secondary | ICD-10-CM | POA: Diagnosis not present

## 2021-10-07 DIAGNOSIS — R220 Localized swelling, mass and lump, head: Secondary | ICD-10-CM | POA: Diagnosis not present

## 2021-10-07 DIAGNOSIS — R404 Transient alteration of awareness: Secondary | ICD-10-CM | POA: Diagnosis not present

## 2021-10-07 DIAGNOSIS — N184 Chronic kidney disease, stage 4 (severe): Secondary | ICD-10-CM | POA: Diagnosis present

## 2021-10-07 DIAGNOSIS — R4182 Altered mental status, unspecified: Secondary | ICD-10-CM | POA: Diagnosis not present

## 2021-10-07 DIAGNOSIS — Z6821 Body mass index (BMI) 21.0-21.9, adult: Secondary | ICD-10-CM | POA: Diagnosis not present

## 2021-10-07 DIAGNOSIS — I6200 Nontraumatic subdural hemorrhage, unspecified: Secondary | ICD-10-CM | POA: Diagnosis not present

## 2021-10-07 DIAGNOSIS — Z515 Encounter for palliative care: Secondary | ICD-10-CM | POA: Diagnosis not present

## 2021-10-07 DIAGNOSIS — R9431 Abnormal electrocardiogram [ECG] [EKG]: Secondary | ICD-10-CM | POA: Diagnosis not present

## 2021-10-07 DIAGNOSIS — R64 Cachexia: Secondary | ICD-10-CM | POA: Diagnosis present

## 2021-10-07 DIAGNOSIS — R54 Age-related physical debility: Secondary | ICD-10-CM | POA: Diagnosis present

## 2021-10-07 DIAGNOSIS — I6782 Cerebral ischemia: Secondary | ICD-10-CM | POA: Diagnosis not present

## 2021-10-07 DIAGNOSIS — M1990 Unspecified osteoarthritis, unspecified site: Secondary | ICD-10-CM | POA: Diagnosis present

## 2021-10-07 DIAGNOSIS — Z79899 Other long term (current) drug therapy: Secondary | ICD-10-CM

## 2021-10-07 DIAGNOSIS — I5189 Other ill-defined heart diseases: Secondary | ICD-10-CM | POA: Diagnosis not present

## 2021-10-07 DIAGNOSIS — R0603 Acute respiratory distress: Secondary | ICD-10-CM | POA: Diagnosis not present

## 2021-10-07 DIAGNOSIS — L89816 Pressure-induced deep tissue damage of head: Secondary | ICD-10-CM | POA: Diagnosis present

## 2021-10-07 DIAGNOSIS — Z66 Do not resuscitate: Secondary | ICD-10-CM | POA: Diagnosis not present

## 2021-10-07 DIAGNOSIS — Z7189 Other specified counseling: Secondary | ICD-10-CM | POA: Diagnosis not present

## 2021-10-07 DIAGNOSIS — E1122 Type 2 diabetes mellitus with diabetic chronic kidney disease: Secondary | ICD-10-CM | POA: Diagnosis present

## 2021-10-07 DIAGNOSIS — L89896 Pressure-induced deep tissue damage of other site: Secondary | ICD-10-CM | POA: Diagnosis present

## 2021-10-07 DIAGNOSIS — N170 Acute kidney failure with tubular necrosis: Secondary | ICD-10-CM | POA: Diagnosis not present

## 2021-10-07 DIAGNOSIS — Z743 Need for continuous supervision: Secondary | ICD-10-CM | POA: Diagnosis not present

## 2021-10-07 DIAGNOSIS — E1151 Type 2 diabetes mellitus with diabetic peripheral angiopathy without gangrene: Secondary | ICD-10-CM | POA: Diagnosis present

## 2021-10-07 DIAGNOSIS — M6282 Rhabdomyolysis: Secondary | ICD-10-CM | POA: Diagnosis not present

## 2021-10-07 DIAGNOSIS — L89156 Pressure-induced deep tissue damage of sacral region: Secondary | ICD-10-CM | POA: Diagnosis present

## 2021-10-07 DIAGNOSIS — E119 Type 2 diabetes mellitus without complications: Secondary | ICD-10-CM | POA: Diagnosis not present

## 2021-10-07 DIAGNOSIS — I472 Ventricular tachycardia, unspecified: Secondary | ICD-10-CM | POA: Diagnosis not present

## 2021-10-07 DIAGNOSIS — Z9049 Acquired absence of other specified parts of digestive tract: Secondary | ICD-10-CM

## 2021-10-07 DIAGNOSIS — R29818 Other symptoms and signs involving the nervous system: Secondary | ICD-10-CM | POA: Diagnosis not present

## 2021-10-07 DIAGNOSIS — E785 Hyperlipidemia, unspecified: Secondary | ICD-10-CM | POA: Diagnosis present

## 2021-10-07 DIAGNOSIS — R29898 Other symptoms and signs involving the musculoskeletal system: Secondary | ICD-10-CM | POA: Diagnosis not present

## 2021-10-07 DIAGNOSIS — G9340 Encephalopathy, unspecified: Secondary | ICD-10-CM | POA: Diagnosis not present

## 2021-10-07 DIAGNOSIS — R221 Localized swelling, mass and lump, neck: Secondary | ICD-10-CM | POA: Diagnosis not present

## 2021-10-07 DIAGNOSIS — S065XAD Traumatic subdural hemorrhage with loss of consciousness status unknown, subsequent encounter: Secondary | ICD-10-CM | POA: Diagnosis not present

## 2021-10-07 DIAGNOSIS — R6889 Other general symptoms and signs: Secondary | ICD-10-CM | POA: Diagnosis not present

## 2021-10-07 DIAGNOSIS — R4020 Unspecified coma: Secondary | ICD-10-CM | POA: Diagnosis not present

## 2021-10-07 DIAGNOSIS — D649 Anemia, unspecified: Secondary | ICD-10-CM | POA: Diagnosis not present

## 2021-10-07 DIAGNOSIS — H918X3 Other specified hearing loss, bilateral: Secondary | ICD-10-CM | POA: Diagnosis present

## 2021-10-07 DIAGNOSIS — K6389 Other specified diseases of intestine: Secondary | ICD-10-CM | POA: Diagnosis not present

## 2021-10-07 DIAGNOSIS — I10 Essential (primary) hypertension: Secondary | ICD-10-CM | POA: Diagnosis not present

## 2021-10-07 DIAGNOSIS — I493 Ventricular premature depolarization: Secondary | ICD-10-CM | POA: Diagnosis not present

## 2021-10-07 DIAGNOSIS — R680 Hypothermia, not associated with low environmental temperature: Secondary | ICD-10-CM | POA: Diagnosis present

## 2021-10-07 DIAGNOSIS — N400 Enlarged prostate without lower urinary tract symptoms: Secondary | ICD-10-CM | POA: Diagnosis present

## 2021-10-07 DIAGNOSIS — E860 Dehydration: Secondary | ICD-10-CM | POA: Diagnosis present

## 2021-10-07 DIAGNOSIS — I6203 Nontraumatic chronic subdural hemorrhage: Secondary | ICD-10-CM | POA: Diagnosis not present

## 2021-10-07 DIAGNOSIS — K5939 Other megacolon: Secondary | ICD-10-CM | POA: Diagnosis not present

## 2021-10-07 DIAGNOSIS — S00511A Abrasion of lip, initial encounter: Secondary | ICD-10-CM | POA: Diagnosis present

## 2021-10-07 DIAGNOSIS — N281 Cyst of kidney, acquired: Secondary | ICD-10-CM | POA: Diagnosis not present

## 2021-10-07 LAB — CBC WITH DIFFERENTIAL/PLATELET
Abs Immature Granulocytes: 0.03 10*3/uL (ref 0.00–0.07)
Basophils Absolute: 0 10*3/uL (ref 0.0–0.1)
Basophils Relative: 0 %
Eosinophils Absolute: 0 10*3/uL (ref 0.0–0.5)
Eosinophils Relative: 0 %
HCT: 27 % — ABNORMAL LOW (ref 39.0–52.0)
Hemoglobin: 8.4 g/dL — ABNORMAL LOW (ref 13.0–17.0)
Immature Granulocytes: 0 %
Lymphocytes Relative: 2 %
Lymphs Abs: 0.2 10*3/uL — ABNORMAL LOW (ref 0.7–4.0)
MCH: 29.3 pg (ref 26.0–34.0)
MCHC: 31.1 g/dL (ref 30.0–36.0)
MCV: 94.1 fL (ref 80.0–100.0)
Monocytes Absolute: 0.4 10*3/uL (ref 0.1–1.0)
Monocytes Relative: 4 %
Neutro Abs: 9.6 10*3/uL — ABNORMAL HIGH (ref 1.7–7.7)
Neutrophils Relative %: 94 %
Platelets: 110 10*3/uL — ABNORMAL LOW (ref 150–400)
RBC: 2.87 MIL/uL — ABNORMAL LOW (ref 4.22–5.81)
RDW: 15 % (ref 11.5–15.5)
WBC: 10.2 10*3/uL (ref 4.0–10.5)
nRBC: 0 % (ref 0.0–0.2)

## 2021-10-07 LAB — I-STAT ARTERIAL BLOOD GAS, ED
Acid-base deficit: 12 mmol/L — ABNORMAL HIGH (ref 0.0–2.0)
Bicarbonate: 14.6 mmol/L — ABNORMAL LOW (ref 20.0–28.0)
Calcium, Ion: 0.94 mmol/L — ABNORMAL LOW (ref 1.15–1.40)
HCT: 24 % — ABNORMAL LOW (ref 39.0–52.0)
Hemoglobin: 8.2 g/dL — ABNORMAL LOW (ref 13.0–17.0)
O2 Saturation: 100 %
Potassium: 6.5 mmol/L (ref 3.5–5.1)
Sodium: 148 mmol/L — ABNORMAL HIGH (ref 135–145)
TCO2: 16 mmol/L — ABNORMAL LOW (ref 22–32)
pCO2 arterial: 36.7 mmHg (ref 32.0–48.0)
pH, Arterial: 7.207 — ABNORMAL LOW (ref 7.350–7.450)
pO2, Arterial: 601 mmHg — ABNORMAL HIGH (ref 83.0–108.0)

## 2021-10-07 LAB — I-STAT CHEM 8, ED
BUN: >130 mg/dL — ABNORMAL HIGH (ref 8–23)
Calcium, Ion: 0.87 mmol/L — CL (ref 1.15–1.40)
Chloride: 122 mmol/L — ABNORMAL HIGH (ref 98–111)
Creatinine, Ser: 10.3 mg/dL — ABNORMAL HIGH (ref 0.61–1.24)
Glucose, Bld: 88 mg/dL (ref 70–99)
HCT: 25 % — ABNORMAL LOW (ref 39.0–52.0)
Hemoglobin: 8.5 g/dL — ABNORMAL LOW (ref 13.0–17.0)
Potassium: 6.7 mmol/L (ref 3.5–5.1)
Sodium: 148 mmol/L — ABNORMAL HIGH (ref 135–145)
TCO2: 15 mmol/L — ABNORMAL LOW (ref 22–32)

## 2021-10-07 LAB — RENAL FUNCTION PANEL
Albumin: 2.4 g/dL — ABNORMAL LOW (ref 3.5–5.0)
Anion gap: 18 — ABNORMAL HIGH (ref 5–15)
BUN: 210 mg/dL — ABNORMAL HIGH (ref 8–23)
CO2: 14 mmol/L — ABNORMAL LOW (ref 22–32)
Calcium: 6.7 mg/dL — ABNORMAL LOW (ref 8.9–10.3)
Chloride: 119 mmol/L — ABNORMAL HIGH (ref 98–111)
Creatinine, Ser: 8.43 mg/dL — ABNORMAL HIGH (ref 0.61–1.24)
GFR, Estimated: 5 mL/min — ABNORMAL LOW (ref >60)
Glucose, Bld: 82 mg/dL (ref 70–99)
Phosphorus: 8.6 mg/dL — ABNORMAL HIGH (ref 2.5–4.6)
Potassium: 6 mmol/L — ABNORMAL HIGH (ref 3.5–5.1)
Sodium: 151 mmol/L — ABNORMAL HIGH (ref 135–145)

## 2021-10-07 LAB — URINALYSIS, ROUTINE W REFLEX MICROSCOPIC
Bacteria, UA: NONE SEEN
Bilirubin Urine: NEGATIVE
Glucose, UA: NEGATIVE mg/dL
Ketones, ur: NEGATIVE mg/dL
Leukocytes,Ua: NEGATIVE
Nitrite: NEGATIVE
Protein, ur: NEGATIVE mg/dL
Specific Gravity, Urine: 1.013 (ref 1.005–1.030)
pH: 5 (ref 5.0–8.0)

## 2021-10-07 LAB — CBC
HCT: 23.3 % — ABNORMAL LOW (ref 39.0–52.0)
Hemoglobin: 7.2 g/dL — ABNORMAL LOW (ref 13.0–17.0)
MCH: 28.6 pg (ref 26.0–34.0)
MCHC: 30.9 g/dL (ref 30.0–36.0)
MCV: 92.5 fL (ref 80.0–100.0)
Platelets: 95 10*3/uL — ABNORMAL LOW (ref 150–400)
RBC: 2.52 MIL/uL — ABNORMAL LOW (ref 4.22–5.81)
RDW: 14.9 % (ref 11.5–15.5)
WBC: 10 10*3/uL (ref 4.0–10.5)
nRBC: 0 % (ref 0.0–0.2)

## 2021-10-07 LAB — LACTIC ACID, PLASMA: Lactic Acid, Venous: 1.2 mmol/L (ref 0.5–1.9)

## 2021-10-07 LAB — CK: Total CK: 1839 U/L — ABNORMAL HIGH (ref 49–397)

## 2021-10-07 LAB — CREATININE, SERUM
Creatinine, Ser: 8.88 mg/dL — ABNORMAL HIGH (ref 0.61–1.24)
GFR, Estimated: 5 mL/min — ABNORMAL LOW (ref >60)

## 2021-10-07 LAB — T4, FREE: Free T4: 0.66 ng/dL (ref 0.61–1.12)

## 2021-10-07 LAB — AMMONIA: Ammonia: 13 umol/L (ref 9–35)

## 2021-10-07 LAB — PROTIME-INR
INR: 1.3 — ABNORMAL HIGH (ref 0.8–1.2)
Prothrombin Time: 16 seconds — ABNORMAL HIGH (ref 11.4–15.2)

## 2021-10-07 LAB — APTT: aPTT: 37 seconds — ABNORMAL HIGH (ref 24–36)

## 2021-10-07 LAB — GLUCOSE, CAPILLARY
Glucose-Capillary: 75 mg/dL (ref 70–99)
Glucose-Capillary: 76 mg/dL (ref 70–99)

## 2021-10-07 LAB — TSH: TSH: 5.686 u[IU]/mL — ABNORMAL HIGH (ref 0.350–4.500)

## 2021-10-07 LAB — RESP PANEL BY RT-PCR (FLU A&B, COVID) ARPGX2
Influenza A by PCR: NEGATIVE
Influenza B by PCR: NEGATIVE
SARS Coronavirus 2 by RT PCR: NEGATIVE

## 2021-10-07 LAB — CBG MONITORING, ED: Glucose-Capillary: 94 mg/dL (ref 70–99)

## 2021-10-07 LAB — ETHANOL: Alcohol, Ethyl (B): <10 mg/dL (ref <10)

## 2021-10-07 MED ORDER — INSULIN ASPART 100 UNIT/ML IJ SOLN: 0.0000 [IU] | INTRAMUSCULAR | Status: DC

## 2021-10-07 MED ORDER — SODIUM CHLORIDE 0.9 % IV SOLN: 1.0000 g | INTRAVENOUS | Status: DC

## 2021-10-07 MED ORDER — HEPARIN SODIUM (PORCINE) 5000 UNIT/ML IJ SOLN: 5000.0000 [IU] | Freq: Three times a day (TID) | INTRAMUSCULAR | Status: DC

## 2021-10-07 MED ORDER — CHLORHEXIDINE GLUCONATE 0.12% ORAL RINSE (MEDLINE KIT)
15.0000 mL | Freq: Two times a day (BID) | OROMUCOSAL | Status: DC
  Administered 2021-10-07 – 2021-10-10 (×6): 15 mL via OROMUCOSAL

## 2021-10-07 MED ORDER — CHLORHEXIDINE GLUCONATE CLOTH 2 % EX PADS
6.0000 | MEDICATED_PAD | Freq: Every day | CUTANEOUS | Status: DC
  Administered 2021-10-07 – 2021-10-16 (×10): 6 via TOPICAL

## 2021-10-07 MED ORDER — CALCIUM GLUCONATE-NACL 1-0.675 GM/50ML-% IV SOLN
1.0000 g | Freq: Once | INTRAVENOUS | Status: AC
  Administered 2021-10-07: 1000 mg via INTRAVENOUS
  Filled 2021-10-07: qty 50

## 2021-10-07 MED ORDER — ROCURONIUM BROMIDE 50 MG/5ML IV SOLN
INTRAVENOUS | Status: DC | PRN
  Administered 2021-10-07: 80 mg via INTRAVENOUS

## 2021-10-07 MED ORDER — LACTATED RINGERS IV SOLN: INTRAVENOUS | Status: DC

## 2021-10-07 MED ORDER — DOCUSATE SODIUM 50 MG/5ML PO LIQD: 100.0000 mg | Freq: Two times a day (BID) | ORAL | Status: DC | PRN

## 2021-10-07 MED ORDER — FENTANYL CITRATE PF 50 MCG/ML IJ SOSY
25.0000 ug | PREFILLED_SYRINGE | INTRAMUSCULAR | Status: DC | PRN
  Administered 2021-10-09: 25 ug via INTRAVENOUS

## 2021-10-07 MED ORDER — DOCUSATE SODIUM 100 MG PO CAPS: 100.0000 mg | ORAL_CAPSULE | Freq: Two times a day (BID) | ORAL | Status: DC | PRN

## 2021-10-07 MED ORDER — FENTANYL CITRATE PF 50 MCG/ML IJ SOSY: 25.0000 ug | PREFILLED_SYRINGE | INTRAMUSCULAR | Status: DC | PRN

## 2021-10-07 MED ORDER — SODIUM BICARBONATE 8.4 % IV SOLN
50.0000 meq | Freq: Once | INTRAVENOUS | Status: AC
  Administered 2021-10-07: 50 meq via INTRAVENOUS
  Filled 2021-10-07: qty 50

## 2021-10-07 MED ORDER — DEXTROSE 50 % IV SOLN
1.0000 | Freq: Once | INTRAVENOUS | Status: AC
  Administered 2021-10-07: 50 mL via INTRAVENOUS
  Filled 2021-10-07: qty 50

## 2021-10-07 MED ORDER — ORAL CARE MOUTH RINSE
15.0000 mL | OROMUCOSAL | Status: DC
  Administered 2021-10-07 – 2021-10-10 (×29): 15 mL via OROMUCOSAL

## 2021-10-07 MED ORDER — SODIUM CHLORIDE 0.9 % IV BOLUS
1000.0000 mL | Freq: Once | INTRAVENOUS | Status: AC
  Administered 2021-10-07: 1000 mL via INTRAVENOUS

## 2021-10-07 MED ORDER — PANTOPRAZOLE SODIUM 40 MG IV SOLR
40.0000 mg | Freq: Every day | INTRAVENOUS | Status: DC
  Administered 2021-10-07: 40 mg via INTRAVENOUS
  Filled 2021-10-07: qty 40

## 2021-10-07 MED ORDER — SODIUM CHLORIDE 0.9 % IV SOLN
INTRAVENOUS | Status: DC | PRN
  Administered 2021-10-07: 500 mL via INTRAVENOUS
  Administered 2021-10-07: 1000 mL via INTRAVENOUS

## 2021-10-07 MED ORDER — SODIUM CHLORIDE 0.9 % IV SOLN
2.0000 g | Freq: Once | INTRAVENOUS | Status: AC
  Administered 2021-10-07: 2 g via INTRAVENOUS
  Filled 2021-10-07: qty 2

## 2021-10-07 MED ORDER — POLYETHYLENE GLYCOL 3350 17 G PO PACK: 17.0000 g | PACK | Freq: Every day | ORAL | Status: DC | PRN

## 2021-10-07 MED ORDER — INSULIN ASPART 100 UNIT/ML IV SOLN
5.0000 [IU] | Freq: Once | INTRAVENOUS | Status: AC
  Administered 2021-10-07: 5 [IU] via INTRAVENOUS

## 2021-10-07 MED ORDER — LACTATED RINGERS IV BOLUS
1000.0000 mL | Freq: Once | INTRAVENOUS | Status: AC
  Administered 2021-10-07: 1000 mL via INTRAVENOUS

## 2021-10-07 MED ORDER — ETOMIDATE 2 MG/ML IV SOLN
INTRAVENOUS | Status: DC | PRN
  Administered 2021-10-07: 20 mg via INTRAVENOUS

## 2021-10-07 MED ORDER — METRONIDAZOLE 500 MG/100ML IV SOLN
500.0000 mg | Freq: Two times a day (BID) | INTRAVENOUS | Status: DC
  Administered 2021-10-07 – 2021-10-08 (×2): 500 mg via INTRAVENOUS
  Filled 2021-10-07: qty 100

## 2021-10-07 MED ORDER — VANCOMYCIN HCL 1250 MG/250ML IV SOLN
1250.0000 mg | Freq: Once | INTRAVENOUS | Status: AC
  Administered 2021-10-07: 1250 mg via INTRAVENOUS
  Filled 2021-10-07: qty 250

## 2021-10-07 MED ORDER — SODIUM BICARBONATE 8.4 % IV SOLN
INTRAVENOUS | Status: DC
  Filled 2021-10-07 (×2): qty 1000

## 2021-10-07 MED ORDER — VANCOMYCIN VARIABLE DOSE PER UNSTABLE RENAL FUNCTION (PHARMACIST DOSING): Status: DC

## 2021-10-07 NOTE — ED Notes (Signed)
Pt back from CT at this time. Connected to room cardiac monitor. Pt ventilating without difficulty on current settings. No acute changes noted. Bear hugger remains in place. Will continue to monitor.

## 2021-10-07 NOTE — ED Notes (Signed)
Lab called with critical lab value: potassium 7.2. MD notified.

## 2021-10-07 NOTE — ED Triage Notes (Addendum)
Pt arrives EMS from home found face down lying in feces. CGS 3-4 per EMS. SpO2 60-70s. LPA left nare present. MD at bedside preparing to intubate at this time. 500 mL bolus given in route

## 2021-10-07 NOTE — ED Notes (Signed)
Good color change on colometric device. CXR ordered

## 2021-10-07 NOTE — Sedation Documentation (Signed)
Dr. Dolores Frame intubating at this time

## 2021-10-07 NOTE — Progress Notes (Signed)
Family at bedside. 

## 2021-10-07 NOTE — ED Notes (Signed)
Family and Intensivist at bedside.

## 2021-10-07 NOTE — ED Notes (Signed)
Bair hugger on pt at this time

## 2021-10-07 NOTE — Progress Notes (Signed)
Pharmacy Antibiotic Note  Ryan Cortez is a 87 y.o. male admitted on 10/07/2021 with sepsis.  Pharmacy has been consulted for cefepime and vancomycin dosing.  Patient with a history of HTN, HL, CKD, DM . Patient presenting unresponsive.  SCr 9.32 - considerably above baseline WBC 10; LA 1.2;   Plan: Vancomycin 1250 mg once - subsequent dosing to be determined by random levels pending nephrology plan for RRT Metronidazole per MD Cefepime 1g q24hr Trend WBC, Fever, Renal function, & Clinical course F/u cultures, clinical course, WBC, fever De-escalate when able F/u Nephrology plan  Height: 5\' 8"  (172.7 cm) Weight: 54.4 kg (120 lb) IBW/kg (Calculated) : 68.4  Temp (24hrs), Avg:88 F (31.1 C), Min:87.9 F (31.1 C), Max:88 F (31.1 C)  Recent Labs  Lab 10/07/21 1805 10/07/21 1812  WBC 10.2  --   CREATININE  --  10.30*    Estimated Creatinine Clearance: 3.5 mL/min (A) (by C-G formula based on SCr of 10.3 mg/dL (H)).    No Known Allergies  Antimicrobials this admission: cefepime 11/28 >>  vancomycin 11/28 >>  metronidazole 11/28 >>   Microbiology results: Pending  Thank you for allowing pharmacy to be a part of this patient's care.  Lorelei Pont, PharmD, BCPS 10/07/2021 6:47 PM ED Clinical Pharmacist -  [phone]

## 2021-10-07 NOTE — Sepsis Progress Note (Signed)
Following per sepsis protocol   

## 2021-10-07 NOTE — ED Provider Notes (Signed)
[facility] EMERGENCY DEPARTMENT Provider Note   CSN: [id number] Arrival date & time:        History Chief Complaint  Patient presents with   Altered Mental Status    Ryan Cortez is a 87 y.o. male.  The history is provided by the patient.  Altered Mental Status Presenting symptoms: confusion, disorientation and unresponsiveness   Severity:  Severe Most recent episode:  Today Episode history:  Single Duration: unknown onset. Timing:  Constant Progression:  Unchanged Chronicity:  New Context comment:  Unknown Associated symptoms: no fever and no rash       No past medical history on file.  Patient Active Problem List   Diagnosis Date Noted   Pressure injury of skin 10/08/2021   AKI (acute kidney injury) ([place]) 10/07/2021   Subdural hematoma    Hyperkalemia    Hypotension    Acute respiratory failure with hypoxia (HCC)         No family history on file.     Home Medications Prior to Admission medications   Medication Sig Start Date End Date Taking? Authorizing Provider  amLODipine (NORVASC) 10 MG tablet Take 10 mg by mouth daily.   Yes [provider]  finasteride (PROSCAR) 5 MG tablet Take 5 mg by mouth daily.   Yes [provider]  furosemide (LASIX) 40 MG tablet Take 40 mg by mouth daily.   Yes [provider]  losartan (COZAAR) 100 MG tablet Take 100 mg by mouth daily.   Yes [provider]  tamsulosin (FLOMAX) 0.4 MG CAPS capsule Take 0.4 mg by mouth daily.   Yes [provider]  dorzolamide (TRUSOPT) 2 % ophthalmic solution Place 1 drop into the left eye 2 (two) times daily. Patient not taking: Reported on 10/07/2021    [provider]  prednisoLONE acetate (PRED FORTE) 1 % ophthalmic suspension Place 1 drop into the right eye in the morning and at bedtime. Patient not taking: Reported on 10/07/2021    [provider]    Allergies    Patient has no known  allergies.  Review of Systems   Review of Systems  Unable to perform ROS: Patient unresponsive  Constitutional:  Negative for fever.  Skin:  Negative for rash.  Psychiatric/Behavioral:  Positive for confusion.    Physical Exam Updated Vital Signs BP (!) 104/54   Pulse (!) 54   Temp (!) 97 F (36.1 C)   Resp 17   Ht _0  (1.727 m)   Wt 50.6 kg   SpO2 100%   BMI 16.96 kg/m   Physical Exam Vitals and nursing note reviewed.  Constitutional:      Appearance: He is well-developed. He is ill-appearing.     Comments: Somnolent appearing, unresponsive to voice  HENT:     Head: Normocephalic.     Comments: Ecchymosis to scalp, abrasion to lower lip w/ associated edema    Right Ear: External ear normal.     Left Ear: External ear normal.     Nose: Nose normal. No congestion.     Mouth/Throat:     Mouth: Mucous membranes are dry.     Pharynx: Oropharynx is clear. No posterior oropharyngeal erythema.  Eyes:     Conjunctiva/sclera: Conjunctivae normal.     Pupils: Pupils are equal, round, and reactive to light.  Cardiovascular:     Rate and Rhythm: Normal rate and regular rhythm.     Pulses: Normal pulses.     Heart sounds:  No murmur heard. Pulmonary:     Effort: Pulmonary effort is normal. No respiratory distress.     Breath sounds: Normal breath sounds. No wheezing, rhonchi or rales.  Abdominal:     General: Abdomen is flat. Bowel sounds are normal. There is no distension.     Palpations: Abdomen is soft.     Tenderness: There is no guarding or rebound.  Musculoskeletal:        General: No deformity. Normal range of motion.     Cervical back: Normal range of motion and neck supple. No rigidity.  Skin:    General: Skin is dry.     Capillary Refill: Capillary refill takes less than 2 seconds.     Findings: No rash.     Comments: Cool to touch  Neurological:     Mental Status: He is lethargic and disoriented.     GCS: GCS eye subscore is 1. GCS verbal subscore is 2. GCS  motor subscore is 3.     Cranial Nerves: No cranial nerve deficit or facial asymmetry.     Comments: Not following commands  Psychiatric:        Mood and Affect: Mood normal.    ED Results / Procedures / Treatments   Labs (all labs ordered are listed, but only abnormal results are displayed) Labs Reviewed  CULTURE, BLOOD (ROUTINE X 2) - Abnormal; Notable for the following components:      Result Value   Special Requests   (*)    Value: AEROCOCCUS SPECIES Blood Culture results may not be optimal due to an inadequate volume of blood received in culture bottles   All other components within normal limits  COMPREHENSIVE METABOLIC PANEL - Abnormal; Notable for the following components:   Sodium 150 (*)    Potassium 7.2 (*)    Chloride 119 (*)    CO2 11 (*)    BUN 229 (*)    Creatinine, Ser 9.32 (*)    Calcium 6.7 (*)    Total Protein 5.5 (*)    Albumin 2.6 (*)    AST 52 (*)    ALT 61 (*)    Alkaline Phosphatase 32 (*)    GFR, Estimated 5 (*)    Anion gap 20 (*)    All other components within normal limits  CBC WITH DIFFERENTIAL/PLATELET - Abnormal; Notable for the following components:   RBC 2.87 (*)    Hemoglobin 8.4 (*)    HCT 27.0 (*)    Platelets 110 (*)    Neutro Abs 9.6 (*)    Lymphs Abs 0.2 (*)    All other components within normal limits  URINALYSIS, ROUTINE W REFLEX MICROSCOPIC - Abnormal; Notable for the following components:   Hgb urine dipstick SMALL (*)    All other components within normal limits  PROTIME-INR - Abnormal; Notable for the following components:   Prothrombin Time 16.0 (*)    INR 1.3 (*)    All other components within normal limits  APTT - Abnormal; Notable for the following components:   aPTT 37 (*)    All other components within normal limits  CK - Abnormal; Notable for the following components:   Total CK 1,839 (*)    All other components within normal limits  TSH - Abnormal; Notable for the following components:   TSH 5.686 (*)    All  other components within normal limits  CBC - Abnormal; Notable for the following components:   RBC 2.52 (*)  Hemoglobin 7.2 (*)    HCT 23.3 (*)    Platelets 95 (*)    All other components within normal limits  CREATININE, SERUM - Abnormal; Notable for the following components:   Creatinine, Ser 8.88 (*)    GFR, Estimated 5 (*)    All other components within normal limits  CBC - Abnormal; Notable for the following components:   RBC 2.67 (*)    Hemoglobin 7.6 (*)    HCT 23.3 (*)    Platelets 108 (*)    All other components within normal limits  BASIC METABOLIC PANEL - Abnormal; Notable for the following components:   Sodium 150 (*)    Potassium 5.6 (*)    Chloride 118 (*)    CO2 16 (*)    BUN 201 (*)    Creatinine, Ser 8.00 (*)    Calcium 7.0 (*)    GFR, Estimated 6 (*)    Anion gap 16 (*)    All other components within normal limits  MAGNESIUM - Abnormal; Notable for the following components:   Magnesium 2.5 (*)    All other components within normal limits  PHOSPHORUS - Abnormal; Notable for the following components:   Phosphorus 7.3 (*)    All other components within normal limits  RENAL FUNCTION PANEL - Abnormal; Notable for the following components:   Sodium 151 (*)    Potassium 6.0 (*)    Chloride 119 (*)    CO2 14 (*)    BUN 210 (*)    Creatinine, Ser 8.43 (*)    Calcium 6.7 (*)    Phosphorus 8.6 (*)    Albumin 2.4 (*)    GFR, Estimated 5 (*)    Anion gap 18 (*)    All other components within normal limits  PROTEIN / CREATININE RATIO, URINE - Abnormal; Notable for the following components:   Protein Creatinine Ratio 0.37 (*)    All other components within normal limits  CK - Abnormal; Notable for the following components:   Total CK 1,707 (*)    All other components within normal limits  MAGNESIUM - Abnormal; Notable for the following components:   Magnesium 2.9 (*)    All other components within normal limits  GLUCOSE, CAPILLARY - Abnormal; Notable for  the following components:   Glucose-Capillary 133 (*)    All other components within normal limits  CBC - Abnormal; Notable for the following components:   RBC 2.51 (*)    Hemoglobin 7.3 (*)    HCT 22.4 (*)    Platelets 99 (*)    All other components within normal limits  BASIC METABOLIC PANEL - Abnormal; Notable for the following components:   Sodium 152 (*)    Potassium 5.4 (*)    Chloride 118 (*)    CO2 17 (*)    BUN 203 (*)    Creatinine, Ser 8.13 (*)    Calcium 6.7 (*)    GFR, Estimated 6 (*)    Anion gap 17 (*)    All other components within normal limits  I-STAT CHEM 8, ED - Abnormal; Notable for the following components:   Sodium 148 (*)    Potassium 6.7 (*)    Chloride 122 (*)    BUN >130 (*)    Creatinine, Ser 10.30 (*)    Calcium, Ion 0.87 (*)    TCO2 15 (*)    Hemoglobin 8.5 (*)    HCT 25.0 (*)    All other components within normal  limits  I-STAT ARTERIAL BLOOD GAS, ED - Abnormal; Notable for the following components:   pH, Arterial 7.207 (*)    pO2, Arterial 601 (*)    Bicarbonate 14.6 (*)    TCO2 16 (*)    Acid-base deficit 12.0 (*)    Sodium 148 (*)    Potassium 6.5 (*)    Calcium, Ion 0.94 (*)    HCT 24.0 (*)    Hemoglobin 8.2 (*)    All other components within normal limits  POCT I-STAT 7, (LYTES, BLD GAS, ICA,H+H) - Abnormal; Notable for the following components:   pH, Arterial 7.466 (*)    pCO2 arterial 24.2 (*)    pO2, Arterial 189 (*)    Bicarbonate 17.6 (*)    TCO2 18 (*)    Acid-base deficit 6.0 (*)    Sodium 152 (*)    Potassium 5.3 (*)    Calcium, Ion 0.92 (*)    HCT 20.0 (*)    Hemoglobin 6.8 (*)    All other components within normal limits  CULTURE, BLOOD (ROUTINE X 2)  RESP PANEL BY RT-PCR (FLU A&B, COVID) ARPGX2  MRSA NEXT GEN BY PCR, NASAL  URINE CULTURE  AMMONIA  LACTIC ACID, PLASMA  RAPID URINE DRUG SCREEN, HOSP PERFORMED  ETHANOL  T4, FREE  SODIUM, URINE, RANDOM  CREATININE, URINE, RANDOM  GLUCOSE, CAPILLARY   GLUCOSE, CAPILLARY  GLUCOSE, CAPILLARY  GLUCOSE, CAPILLARY  GLUCOSE, CAPILLARY  GLUCOSE, CAPILLARY  GLUCOSE, CAPILLARY  BLOOD GAS, ARTERIAL  KAPPA/LAMBDA LIGHT CHAINS  IMMUNOFIXATION ELECTROPHORESIS  HEMOGLOBIN A1C  BLOOD GAS, ARTERIAL  BASIC METABOLIC PANEL  CBG MONITORING, ED    EKG EKG Interpretation  Date/Time:  Monday October 07 2021 19:18:59 EST Ventricular Rate:  70 PR Interval:  255 QRS Duration: 117 QT Interval:  486 QTC Calculation: 525 R Axis:   41 Text Interpretation: Sinus rhythm Prolonged PR interval Nonspecific intraventricular conduction delay No significant change was found Confirmed by Aletta Edouard [phone]) on 10/07/2021 7:31:06 PM  Radiology CT HEAD WO CONTRAST  Result Date: 10/07/2021 CLINICAL DATA:  Neurologic deficit. EXAM: CT HEAD WITHOUT CONTRAST TECHNIQUE: Contiguous axial images were obtained from the base of the skull through the vertex without intravenous contrast. COMPARISON:  None. FINDINGS: Brain: Moderate age-related atrophy and chronic microvascular ischemic changes. 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 There is however a smaller high attenuating area within these collection over the right parietal lobe (22/4) concerning for an acute or subacute subdural hemorrhage. This measures approximately 4 mm in thickness. There is associated mass effect and 6 mm right to left midline shift. Vascular: No hyperdense vessel or unexpected calcification. Skull: Normal. Negative for fracture or focal lesion. Sinuses/Orbits: The visualized paranasal sinuses and mastoid air cells are clear. Other: None IMPRESSION: 1. 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 Mass effect on the right brain with approximately 6 mm right to left midline shift. 2. Moderate age-related atrophy and chronic microvascular  ischemic changes. These results were called by telephone at the time of interpretation on 10/07/2021 at 8:46 pm to Dr. Melina Copa, who verbally acknowledged these results. Electronically Signed   By: Anner Crete M.D.   On: 10/07/2021 20:52   CT CERVICAL SPINE WO CONTRAST  Result Date: 10/08/2021 CLINICAL DATA:  Fall EXAM: CT CERVICAL SPINE WITHOUT CONTRAST TECHNIQUE: Multidetector CT imaging of the cervical spine was performed without intravenous contrast. Multiplanar CT  image reconstructions were also generated. COMPARISON:  None. FINDINGS: Alignment: No static subluxation. Facets are aligned. Occipital condyles and the lateral masses of C1 and C2 are normally approximated. Skull base and vertebrae: No acute fracture. Soft tissues and spinal canal: No prevertebral fluid or swelling. No visible canal hematoma. Disc levels: No advanced spinal canal or neural foraminal stenosis. Upper chest: No pneumothorax, pulmonary nodule or pleural effusion. Other: Normal visualized paraspinal cervical soft tissues. IMPRESSION: No acute fracture or static subluxation of the cervical spine. Electronically Signed   By: Ulyses Jarred M.D.   On: 10/08/2021 03:53   US RENAL  Result Date: 10/07/2021 CLINICAL DATA:  Initial evaluation for acute kidney injury. EXAM: RENAL / URINARY TRACT ULTRASOUND COMPLETE COMPARISON:  Prior CT from 11/05/2018. FINDINGS: Right Kidney: Renal measurements: 8.6 x 3.4 x 5.1 cm = volume: 76.2 mL. Right kidney is somewhat small with markedly increased echogenicity within the renal parenchyma. Poor corticomedullary differentiation. No visible nephrolithiasis. Minimal pelviectasis without hydronephrosis. No focal renal mass. Left Kidney: Renal measurements: 8.6 x 4.9 x 4.1 cm = volume: 89.7 mL. Left kidney is somewhat small with increased echogenicity within the renal parenchyma. Poor corticomedullary differentiation. No nephrolithiasis or hydronephrosis. 1.3 x 0.8 x 0.9 cm simple cyst present at the  lower pole. Additional 1.4 x 1.3 x 1.2 cm minimally complex but benign appearing cyst at the upper pole. Additional small 1.1 cm simple cyst at the upper pole. Bladder: Decompressed with a Foley catheter in place. Other: None. IMPRESSION: 1. Increased echogenicity within the renal parenchyma, consistent with medical renal disease. 2. No hydronephrosis. 3. Few benign-appearing left renal cysts measuring up to 1.4 cm as above. 4. Foley catheter in place with decompression of the bladder. Electronically Signed   By: Jeannine Boga M.D.   On: 10/07/2021 22:55   DG Chest Portable 1 View  Result Date: 10/07/2021 CLINICAL DATA:  Intubated EXAM: PORTABLE CHEST 1 VIEW COMPARISON:  None. FINDINGS: Endotracheal tube tip is about 3.8 cm superior to carina. No esophageal visible on the included portions of the chest. Lung fields are clear. Normal cardiomediastinal silhouette with aortic atherosclerosis. No pneumothorax IMPRESSION: 1. Endotracheal tube tip about 3.8 cm superior to carina 2. No visible nasogastric tube on the included portions of the chest Electronically Signed   By: Donavan Foil M.D.   On: 10/07/2021 18:45    Procedures Procedure Name: Intubation Date/Time: 10/07/2021 6:09 PM Performed by: Idamae Lusher, MD Pre-anesthesia Checklist: Patient being monitored, Emergency Drugs available and Timeout performed Oxygen Delivery Method: Ambu bag Preoxygenation: Pre-oxygenation with 100% oxygen Induction Type: Rapid sequence Ventilation: Mask ventilation without difficulty Laryngoscope Size: 3 and Mac Grade View: Grade I Tube size: 7.5 mm Number of attempts: 1 Airway Equipment and Method: Rigid stylet Placement Confirmation: ETT inserted through vocal cords under direct vision, CO2 detector, Breath sounds checked- equal and bilateral and Positive ETCO2 Secured at: 23 cm Tube secured with: ETT holder Dental Injury: Teeth and Oropharynx as per pre-operative assessment  Difficulty Due To:  Difficulty was anticipated      Medications Ordered in ED Medications  etomidate (AMIDATE) injection (20 mg Intravenous Given 10/07/21 1755)  rocuronium (ZEMURON) injection (80 mg Intravenous Given 10/07/21 1756)  0.9 %  sodium chloride infusion (0 mLs  Stopped 10/07/21 2059)  polyethylene glycol (MIRALAX / GLYCOLAX) packet 17 g (has no administration in time range)  fentaNYL (SUBLIMAZE) injection 25 mcg (has no administration in time range)  fentaNYL (SUBLIMAZE) injection 25-100 mcg (has no administration in time  range)  docusate (COLACE) 50 MG/5ML liquid 100 mg (has no administration in time range)  insulin aspart (novoLOG) injection 0-6 Units (0 Units Subcutaneous Not Given 10/08/21 1254)  Chlorhexidine Gluconate Cloth 2 % PADS 6 each (6 each Topical Given 10/07/21 2224)  chlorhexidine gluconate (MEDLINE KIT) (PERIDEX) 0.12 % solution 15 mL (15 mLs Mouth Rinse Given 10/08/21 0839)  MEDLINE mouth rinse (15 mLs Mouth Rinse Given 10/08/21 1323)  sodium bicarbonate 150 mEq in dextrose 5 % 1,150 mL infusion ( Intravenous New Bag/Given 10/08/21 1304)  pantoprazole (PROTONIX) injection 40 mg (40 mg Intravenous Given 10/08/21 1255)  free water 400 mL (400 mLs Per Tube Given 10/08/21 1255)  0.45 % sodium chloride infusion ( Intravenous New Bag/Given 10/08/21 1110)  sodium chloride 0.9 % bolus 1,000 mL (0 mLs Intravenous Stopped 10/07/21 2014)  calcium gluconate 1 g/ 50 mL sodium chloride IVPB (0 mg Intravenous Stopped 10/07/21 1937)  insulin aspart (novoLOG) injection 5 Units (5 Units Intravenous Given 10/07/21 1922)    And  dextrose 50 % solution 50 mL (50 mLs Intravenous Given 10/07/21 1923)  sodium bicarbonate injection 50 mEq (50 mEq Intravenous Given 10/07/21 1923)  ceFEPIme (MAXIPIME) 2 g in sodium chloride 0.9 % 100 mL IVPB (0 g Intravenous Stopped 10/07/21 2014)  vancomycin (VANCOREADY) IVPB 1250 mg/250 mL (0 mg Intravenous Stopped 10/07/21 2234)  lactated ringers bolus 1,000 mL  (1,000 mLs Intravenous New Bag/Given 10/07/21 2101)  calcium gluconate inj 10% (1 g) URGENT USE ONLY! (1 g Intravenous Given 10/08/21 0032)  insulin aspart (novoLOG) injection 5 Units (5 Units Intravenous Given 10/08/21 0032)    And  dextrose 50 % solution 50 mL (50 mLs Intravenous Given 10/08/21 0031)  sodium bicarbonate injection 50 mEq (50 mEq Intravenous Given 10/08/21 0032)    ED Course  I have reviewed the triage vital signs and the nursing notes.  Pertinent labs & imaging results that were available during my care of the patient were reviewed by me and considered in my medical decision making (see chart for details).  Clinical Course as of 10/08/21 1325  Mon Oct 07, 2021  1805 Elderly male found down prone in house.  No obvious bruising to face chest.  EMS states was covered in dark stool.  Patient is minimally responsive not following commands.  Needed intubation on arrival.  Getting labs EKG chest x-ray and will need further imaging as we get him stabilized. [MB]  1958 Discussed with Dr. Patsey Berthold from critical care who will put the patient on the list for admission. [MB]  2051 Radiology called me regarding his CT head.  Has acute on chronic subdural with some shift.  I secure message this to Dr. Patsey Berthold from the critical care team. [MB]    Clinical Course User Index [MB] Ryan Rasmussen, MD   MDM Rules/Calculators/A&P                          87 year old male presents with altered mental status.  Found down in house.  Unknown downtime.  Scattered ecchymosis to face and chest.  He was hypoxic to the 60s for EMS, arrives with NPA and nonrebreather.  He is unresponsive and moaning insensibly.  He is not following commands.  He does not open his eyes.  GCS 4-5.   Intubated as above for airway protection. Got 500 cc of fluid in route.  He is hemodynamically stable. Mucous membranes dry.  Clinically appears dehydrated.  Rectal temperature 88 F.  Started on bear hugger. Code sepsis  initiated. Cx pending. Started on empiric abx. IV fluids per sepsis protocol. CT head pending. Labs significant for severe kidney dysfxn w/ Cr 9.3, BUN 229, K 7.2, bicarb 11. Pt given calcium, insulin d10, bicarb. Renal consulted for emergent dialsysis. Likely encephalopathic from severe uremia, acidosis, possible infection.Abx as above.  Lactic acid normal. CK elevated to 1839 cf mild rhabdo.  UA showed no signs of acute infxn. COVID/flu pending. ABG showed ph 7.2, po2 600, pco2 36. CT head showed acute vs subacute SDH w/ 6 mm midline shift. Critical care contacted for admission. Hand off given. Will contact nsg regarding sdh.   Final Clinical Impression(s) / ED Diagnoses Final diagnoses:  AKI (acute kidney injury) ([place])  Fall  Encounter for orogastric (OG) tube placement  Gastrointestinal tube present [facility])    Rx / DC Orders ED Discharge Orders     None        Idamae Lusher, MD 10/08/21 1333    Ryan Rasmussen, MD 10/09/21 1325

## 2021-10-07 NOTE — Progress Notes (Signed)
[place] placed on patient.

## 2021-10-07 NOTE — H&P (Addendum)
NAME:  Ryan Cortez, MRN:  [id number], DOB:  09/02/1927, LOS: 0 ADMISSION DATE:  10/07/2021, CONSULTATION DATE:  10/07/21 REFERRING MD:  EDP, CHIEF COMPLAINT:  AMS  History of Present Illness:  Ryan Cortez is a 87 y/o M with PMH significant for HTN, HL, CKD, DM who was found down in his home minimally responsive and covered in dark stool.  Daughter and patient's friend are at the bedside and state they last saw him on 11/15 and that someone working at his apartment complex must have called EMS.  He is very independent at baseline and still drives, though refuses to use his cane.  Pt was intubated on arrival. Work-up significant for renal insufficiency with creatinine of 9.3 and K of 7.2, Na 150 and CK 1800.  CXR without significant infiltrate.  He was given IVF, broad spectrum abx and temporizing measures for hyperkalemia and PCCM consulted. Head CT showed R parietal 20mm subdural, likely old hygroma but with  33mm area acute or subacute subdural with 71mm midline shift  Pertinent  Medical History         Past Medical History:  Diagnosis Date   Anemia      iron Rxed by Dr Ardis Hughs   Arthritis     BPH (benign prostatic hyperplasia)     Diabetes ([place])     Fasting hyperglycemia     Hyperlipidemia     Hypertension     Other abnormal glucose 2008    A1c 6.1%   Positive PPD 1991    no INH prophylaxis due to age   PVD (peripheral vascular disease) ([place])     Pyloric stenosis     Renal insufficiency      Dr Justin Mend     [facility] Events: Including procedures, antibiotic start and stop dates in addition to other pertinent events   11/28 Found down, intubated and given Cefepime/Vanc/Flagyl    Lines/Tubes 11/28 ETT   Significant Imaging 11/28 CT head>Probable small acute or subacute right parietal subdural hemorrhage on a moderate-sized right hemispheric 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 Mass effect on the right brain with approximately 6 mm right to left midline shift.  Interim History  / Subjective:  Pt with borderline hypotension and hypothermia.  MAP improved with 1L IVF CT head with acute on chronic subdural  Objective   Blood pressure (!) 113/46, pulse 71, temperature (!) 90.6 F (32.6 C), temperature source Bladder, resp. rate 18, height 5\' 8"  (1.727 m), weight 54.4 kg, SpO2 100 %.    Vent Mode: PRVC FiO2 (%):  [60 %-100 %] 100 % Set Rate:  [18 bmp] 18 bmp Vt Set:  [540 mL] 540 mL PEEP:  [5 cmH20] 5 cmH20 Plateau Pressure:  [15 cmH20-16 cmH20] 16 cmH20   Intake/Output Summary (Last 24 hours) at 10/07/2021 2034 Last data filed at 10/07/2021 2014 Gross per 24 hour  Intake 1150 ml  Output --  Net 1150 ml   Filed Weights   10/07/21 1804  Weight: 54.4 kg   General:  poorly nourished elderly M, intubated and unresponsive HEENT: MM pink/dry, pupils 15mm bilaterally and responsive to light, sclera anicteric Neuro:  intubated on no sedation, grimaces to pain, + corneals, otherwise does not respond to voice, no noted purposeful movement CV: s1s2 rrr, no m/r/g PULM:  mechanically ventilated with decreased air movement in bilateral bases  GI: soft, bsx4 active  Extremities: cool/dry, extremely dry skin without  edema  Skin: no rashes or lesions   Resolved Hospital Problem list  Assessment & Plan:    Acute Encephalopathy and Hypoxic Respiratory Failure Acute vs. Subacute R parietal subdural Likely metabolic and secondary to acute/subacute subdural CT with old hygroma and 73mm acute vs subacute subdural Not on anti-coagulation UDS and ETOH unremarkable -admit to ICU, supportive care with HOB 30 degrees, maintain SBP >100  -monitor neuro status, not on sedation currently, suspect exam may improve as metabolic derangement improve  -Neurosurgery consult -Maintain full vent support with SAT/SBT as tolerated -titrate Vent setting to maintain SpO2 greater than or equal to 90%. -HOB elevated 30 degrees. -Plateau pressures less than 30 cm H20.  -Follow chest  x-ray, ABG prn.   -Bronchial hygiene and RT/bronchodilator protocol.      Acute on Chronic renal failure with hyperkalemia Possible Rhabdomyolysis AGMA Creatinine >9 with last baseline in Epic ~3 last year BUN 229 Lactic acid 1.2 -appreciate nephrology consult, pt has urinated >1L immediately after foley placed, possibly obstructive vs ATN in the setting of severe volume depletion -no urgent indication for dialysis -renal US -monitor I&O -avoid nephrotoxins and renally dose meds -bicarb gtt, additional L LR -trend CK -received insulin and Ca   Hypotension and Hypothermia Improving with IVF and bair hugger -consider sepsis, though no clear source and lactic acid WNL -additional L IVF and then bicarb gtt -continue broad spectrum abx and obtain blood cultures, low threshold to de-escalate    Baseline HTN, HL -holding home Lasix and Norvasc in the setting of the above -obtain echo   Protein Calorie Malnutrition POA -will need nutrition consult and likely TF pending neurosurgery eval    Best Practice (right click and "Reselect all SmartList Selections" daily)   Diet/type: NPO DVT prophylaxis: SCD GI prophylaxis: PPI Lines: N/A Foley:  Yes, and it is still needed Code Status:  full code Last date of multidisciplinary goals of care discussion [discussed with Pt's daughter and friend. Pt had no advanced directives and they had not discussed life support, want aggressive care and full code at this time]  Labs   CBC: Recent Labs  Lab 10/07/21 1805 10/07/21 1812 10/07/21 1839  WBC 10.2  --   --   NEUTROABS 9.6*  --   --   HGB 8.4* 8.5* 8.2*  HCT 27.0* 25.0* 24.0*  MCV 94.1  --   --   PLT 110*  --   --     Basic Metabolic Panel: Recent Labs  Lab 10/07/21 1805 10/07/21 1812 10/07/21 1839  NA 150* 148* 148*  K 7.2* 6.7* 6.5*  CL 119* 122*  --   CO2 11*  --   --   GLUCOSE 91 88  --   BUN 229* >130*  --   CREATININE 9.32* 10.30*  --   CALCIUM 6.7*  --    --    GFR: Estimated Creatinine Clearance: 3.5 mL/min (A) (by C-G formula based on SCr of 10.3 mg/dL (H)). Recent Labs  Lab 10/07/21 1805  WBC 10.2  LATICACIDVEN 1.2    Liver Function Tests: Recent Labs  Lab 10/07/21 1805  AST 52*  ALT 61*  ALKPHOS 32*  BILITOT 0.9  PROT 5.5*  ALBUMIN 2.6*   No results for input(s): LIPASE, AMYLASE in the last 168 hours. Recent Labs  Lab 10/07/21 1805  AMMONIA 13    ABG    Component Value Date/Time   PHART 7.207 (L) 10/07/2021 1839   PCO2ART 36.7 10/07/2021 1839   PO2ART 601 (H) 10/07/2021 1839   HCO3 14.6 (L) 10/07/2021 1839  TCO2 16 (L) 10/07/2021 1839   ACIDBASEDEF 12.0 (H) 10/07/2021 1839   O2SAT 100.0 10/07/2021 1839     Coagulation Profile: Recent Labs  Lab 10/07/21 1805  INR 1.3*    Cardiac Enzymes: Recent Labs  Lab 10/07/21 1805  CKTOTAL 1,839*    HbA1C: No results found for: HGBA1C  CBG: Recent Labs  Lab 10/07/21 1902  GLUCAP 94    Review of Systems:   Unable to obtain secondary to mental status  Past Medical History:  He,  has no past medical history on file.   Surgical History:     Social History:      Family History:  His family history is not on file.   Allergies No Known Allergies   Home Medications  Prior to Admission medications   Medication Sig Start Date End Date Taking? Authorizing Provider  dorzolamide (TRUSOPT) 2 % ophthalmic solution Place 1 drop into the left eye 2 (two) times daily.   Yes [provider]  prednisoLONE acetate (PRED FORTE) 1 % ophthalmic suspension Place 1 drop into the right eye in the morning and at bedtime.   Yes [provider]  amLODipine (NORVASC) 10 MG tablet Take 10 mg by mouth daily.    [provider]  finasteride (PROSCAR) 5 MG tablet Take 5 mg by mouth daily.    [provider]  furosemide (LASIX) 40 MG tablet Take 40 mg by mouth daily.    [provider]  losartan (COZAAR) 100 MG tablet Take 100  mg by mouth daily.    [provider]  tamsulosin (FLOMAX) 0.4 MG CAPS capsule Take 0.4 mg by mouth daily.    [provider]     Critical care time: 50 minutes    CRITICAL CARE Performed by: Otilio Carpen Glendora Clouatre   Total critical care time: 50 minutes  Critical care time was exclusive of separately billable procedures and treating other patients.  Critical care was necessary to treat or prevent imminent or life-threatening deterioration.  Critical care was time spent personally by me on the following activities: development of treatment plan with patient and/or surrogate as well as nursing, discussions with consultants, evaluation of patient's response to treatment, examination of patient, obtaining history from patient or surrogate, ordering and performing treatments and interventions, ordering and review of laboratory studies, ordering and review of radiographic studies, pulse oximetry and re-evaluation of patient's condition.  Otilio Carpen Robertson Colclough, PA-C [facility] See Amion for pager If no response to pager , please call [phone] until 7pm After 7:00 pm call Elink  [phone]?[place]

## 2021-10-07 NOTE — Progress Notes (Addendum)
eLink Physician-Brief Progress Note Patient Name: Ryan Cortez DOB: [birth year] MRN: [id number]   Date of Service  10/07/2021  HPI/Events of Note  87 yr old man admitted to ICU with AKI, respiratory failure, altered mental status. Intubated and not in distress on PRVC VT 540, RR 18, O2 60, peep 5. O2 sat is 100. RN notes he is waking up but not following commands. Has been seen by PCCM and orders have been placed . Currently stable BP not  on pressors.   eICU Interventions  D/w RN No needs from E link at this time Call us if needed     Intervention Category Major Interventions: Respiratory failure - evaluation and management;Acute renal failure - evaluation and management Evaluation Type: New Patient Evaluation  Margaretmary Lombard 10/07/2021, 10:27 PM  Addendum 12:10 am Notified of BMP  K improving to 6 Repeat insulin, d50, calcium, bicarb push and post therapy glucose monitoring ordered Also wrote for magnesium Corrected cal is 7.7  Will repeat the labs at 4 am In addition, was notified that patient has an order in for X rays of his C spine and has been place in a C collar so the X rays cannot be done and will not have good quality Will treat the K, and then take him for CT spine to rule out fracture since he fell Have d/w ground team   Addendum 1:40 am Notified of some PVCs No sustained VT EKG done, ST with PVCs, Qtc is 475, no hypotension, patient is waking up more Hyperkalemia was already treated Have asked that RN send of repeat lab at 230 am   Addendum at 4:15 am Followed up on labs K is improved to 5.6 , creatinine fairly unchanged CT C spine with nothing acute, does not need the C collar anymore Continue remainder of plan per ccm   Addendum at 520 am ABG noted Has some resp alkalosis . Set RR is 18, VT is 540 cc - this is at around 8 cc/kg IBW Lower VT to 500. Repeat ABG at 730 am Hb resulted here as 6.8, CBC is pending  Addendum at 635 am Followed up on CBC -  Hb is 7.3 Also will order a repeat bmp at 8 am to follow renal function and potassium

## 2021-10-07 NOTE — Consult Note (Signed)
Reason for Consult:AKI/CKD stage IV Referring Physician: Duwayne Heck, MD  Ryan Cortez is an 87 y.o. male has a PMH significant for DM type 2, HTN, BPH, HLD, pyloric stenosis, h/o + PPD, and CKD stage IV who was found down in his home, unresponsive, and lying in feces.  Unknown downtime and unclear who called EMS.  EMS evaluated pt and noted to be hypoxic and nasal airway placed.  In the ED, he was hypothermic with temp 88.8, unresponsive and was intubated in the ED.  Labs were notable for Na 148, K 6.5, Cl 122, Co2 11, BUN >130, Cr 10.3, Ca 6.7, alb 2.6, AST 52, ALT 61, CK 1839, WBC 10.2, Hgb 8.2, Plt 110, resp panel negative for flu or covid-19.  Foley catheter was place and large amount of urine was obtained, dark initially but gradually clearing.  We were consulted to further evaluate his AKI/CKD stage IV and other electrolyte abnormalities.  He was followed by our practice but last seen by Dr. Justin Mend in December 2021 and Scr was 3.1 around that time but has not been rechecked since.    Trend in Creatinine: Creatinine, Ser  Date/Time Value Ref Range Status  10/07/2021 06:12 PM 10.30 (H) 0.61 - 1.24 mg/dL Final  10/07/2021 06:05 PM 9.32 (H) 0.61 - 1.24 mg/dL Final  09/04/2020 3.10 (H) 0.76 - 1.27 mg/dL Final  05/10/2019 2.66 (H) 0.76 - 1.27 mg/dL Final  12/18/2017 2.45 (H) 0.76 - 1.27 mg/dL Final    PMH:    Anemia      iron Rxed by Dr Ardis Hughs   Arthritis     BPH (benign prostatic hyperplasia)     Diabetes ([place])     Fasting hyperglycemia     Hyperlipidemia     Hypertension     Other abnormal glucose 2008    A1c 6.1%   Positive PPD 1991    no INH prophylaxis due to age   PVD (peripheral vascular disease) ([place])     Pyloric stenosis     Renal insufficiency      Dr Justin Mend   [facility]:   Procedure Laterality Date   CHOLECYSTECTOMY       COLONOSCOPY W/ POLYPECTOMY       LIVER BIOPSY       UPPER GASTROINTESTINAL ENDOSCOPY   02/2007    Dr. Oretha Caprice; gastritis,incomplete pyloric stricture with  partial outlet obstruction    Social History         Socioeconomic History   Marital status: Divorced      Spouse name: Not on file   Number of children: Not on file   Years of education: Not on file   Highest education level: Not on file  Occupational History   Occupation: Retired  Tobacco Use   Smoking status: Former Smoker      Quit date: 11/11/1975      Years since quitting: 44.8   Smokeless tobacco: Never Used   Tobacco comment: smoked age [age],up to 1/2 ppd    Family History  Problem Relation Age of Onset   Stroke Father 70   Hypertension Mother     Heart attack Mother 58   Cancer Maternal Aunt          ? primary   Alcohol abuse Brother     Diabetes Neg Hx       Allergies: No Known Allergies  Medications:   Prior to Admission medications   Medication Sig Start Date End Date Taking? Authorizing Provider  dorzolamide (TRUSOPT)  2 % ophthalmic solution Place 1 drop into the left eye 2 (two) times daily.   Yes [provider]  prednisoLONE acetate (PRED FORTE) 1 % ophthalmic suspension Place 1 drop into the right eye in the morning and at bedtime.   Yes [provider]  amLODipine (NORVASC) 10 MG tablet Take 10 mg by mouth daily.    [provider]  finasteride (PROSCAR) 5 MG tablet Take 5 mg by mouth daily.    [provider]  furosemide (LASIX) 40 MG tablet Take 40 mg by mouth daily.    [provider]  losartan (COZAAR) 100 MG tablet Take 100 mg by mouth daily.    [provider]  tamsulosin (FLOMAX) 0.4 MG CAPS capsule Take 0.4 mg by mouth daily.    [provider]    Inpatient medications:  heparin  5,000 Units Subcutaneous Q8H   pantoprazole (PROTONIX) IV  40 mg Intravenous QHS   vancomycin variable dose per unstable renal function (pharmacist dosing)   Does not apply See admin instructions    Discontinued Meds:   Medications Discontinued During This Encounter  Medication Reason   docusate  sodium (COLACE) capsule 100 mg     Social History:  has no history on file for tobacco use, alcohol use, and drug use.  Family History:  No family history on file.  Review of systems not obtained due to patient factors. Weight change:   Intake/Output Summary (Last 24 hours) at 10/07/2021 2048 Last data filed at 10/07/2021 2014 Gross per 24 hour  Intake 1150 ml  Output --  Net 1150 ml   BP (!) 119/59   Pulse 75   Temp (!) 91.6 F (33.1 C)   Resp 18   Ht 5\' 8"  (1.727 m)   Wt 54.4 kg   SpO2 100%   BMI 18.25 kg/m  Vitals:   10/07/21 2000 10/07/21 2010 10/07/21 2020 10/07/21 2040  BP: (!) 115/36 (!) 113/46 (!) 112/45 (!) 119/59  Pulse: 71 71 74 75  Resp: 18 18 18 18   Temp: (!) 90.2 F (32.3 C) (!) 90.6 F (32.6 C) (!) 91 F (32.8 C) (!) 91.6 F (33.1 C)  TempSrc:  Bladder    SpO2: 100% 100% 100% 100%  Weight:      Height:         General appearance: intubated and sedated Head: atraumatic, + bitemporal wasting Resp: ventilated breath sounds bilaterally Cardio: regular rate and rhythm, S1, S2 normal, no murmur, click, rub or gallop GI: soft, non-tender; bowel sounds normal; no masses,  no organomegaly Extremities: no edema, + bark like appearance of shins bilaterally  Labs: Basic Metabolic Panel: Recent Labs  Lab 10/07/21 1805 10/07/21 1812 10/07/21 1839  NA 150* 148* 148*  K 7.2* 6.7* 6.5*  CL 119* 122*  --   CO2 11*  --   --   GLUCOSE 91 88  --   BUN 229* >130*  --   CREATININE 9.32* 10.30*  --   ALBUMIN 2.6*  --   --   CALCIUM 6.7*  --   --    Liver Function Tests: Recent Labs  Lab 10/07/21 1805  AST 52*  ALT 61*  ALKPHOS 32*  BILITOT 0.9  PROT 5.5*  ALBUMIN 2.6*   No results for input(s): LIPASE, AMYLASE in the last 168 hours. Recent Labs  Lab 10/07/21 1805  AMMONIA 13   CBC: Recent Labs  Lab 10/07/21 1805 10/07/21 1812 10/07/21 1839  WBC 10.2  --   --  NEUTROABS 9.6*  --   --   HGB 8.4* 8.5* 8.2*  HCT 27.0* 25.0* 24.0*  MCV  94.1  --   --   PLT 110*  --   --    PT/INR: @LABRCNTIP (inr:5) Cardiac Enzymes: ) Recent Labs  Lab 10/07/21 1805  CKTOTAL 1,839*   CBG: Recent Labs  Lab 10/07/21 1902  GLUCAP 94    Iron Studies: No results for input(s): IRON, TIBC, TRANSFERRIN, FERRITIN in the last 168 hours.  Xrays/Other Studies: CT HEAD WO CONTRAST  Result Date: 10/07/2021 CLINICAL DATA:  Neurologic deficit. EXAM: CT HEAD WITHOUT CONTRAST TECHNIQUE: Contiguous axial images were obtained from the base of the skull through the vertex without intravenous contrast. COMPARISON:  None. FINDINGS: Brain: Moderate age-related atrophy and chronic microvascular ischemic changes. 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 There is however a smaller high attenuating area within these collection over the right parietal lobe (22/4) concerning for an acute or subacute subdural hemorrhage. This measures approximately 4 mm in thickness. There is associated mass effect and 6 mm right to left midline shift. Vascular: No hyperdense vessel or unexpected calcification. Skull: Normal. Negative for fracture or focal lesion. Sinuses/Orbits: The visualized paranasal sinuses and mastoid air cells are clear. Other: None IMPRESSION: 1. 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 Mass effect on the right brain with approximately 6 mm right to left midline shift. 2. Moderate age-related atrophy and chronic microvascular ischemic changes. These results were called by telephone at the time of interpretation on 10/07/2021 at 8:46 pm to Dr. Melina Copa, who verbally acknowledged these results. Electronically Signed   By: Anner Crete M.D.   On: 10/07/2021 20:52   DG Chest Portable 1 View  Result Date: 10/07/2021 CLINICAL DATA:  Intubated EXAM: PORTABLE CHEST 1 VIEW COMPARISON:  None. FINDINGS:  Endotracheal tube tip is about 3.8 cm superior to carina. No esophageal visible on the included portions of the chest. Lung fields are clear. Normal cardiomediastinal silhouette with aortic atherosclerosis. No pneumothorax IMPRESSION: 1. Endotracheal tube tip about 3.8 cm superior to carina 2. No visible nasogastric tube on the included portions of the chest Electronically Signed   By: Donavan Foil M.D.   On: 10/07/2021 18:45     Assessment/Plan:  AKI/CKD stage IV - unclear what his recent baseline Scr has been since none documented in a year.  Had advanced CKD in 2021.  Did have significant UOP after foley catheter placed so BOO on DDx as well as mild rhabdo.  Likely ischemic ATN in setting of volume depletion and possible GI bleed vs progression of his underlying CKD.  No urgent indication for dialysis at this time given marked UOP and improving K.  Will continue with IVF's and medical management.  Will need to speak with family to determine how aggressive they wish to be as well as get a better idea of his functional status prior to this event. Renal dose meds Avoid nephrotoxic agents such as IV contrast, NSAIDs/Cox-II I's, phosphate contain bowel preps (FLEETS) Will order renal US to evaluate size and contour of kidneys Continue with foley catheter Will also check SPEP/UPEP Follow UOP and renal function.  Hyperkalemia - due to #1.  Improving with IV insulin/D50, and IV bicarb.   Anion gap metabolic acidosis - due to #1.  Given IV bicarb and lactated ringer boluses.  Will continue with LR at 125 ml/hr  for now but if no significant improvement would switch to isotonic bicarb but need to replace calcium first. AMS - unclear inciting event.  No history of drugs or alcohol.  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  Mass effect on brain with right to left midline shift.  Consider Neurosurgery consultation.   Will need to discuss with family when last seen and his state at the time. VDRF- intubated for airway protection.  Per PCCM Anemia - unclear if acute or chronic.  Guaiac stool and follow H/H.  Transfuse for Hgb <7. Moderate protein and caloric malnutrition - per PCCM.  Disposition - poor overall prognosis and recommend palliative care consult to help set goals/limits of care.    Governor Rooks Rigel Filsinger 10/07/2021, 8:48 PM

## 2021-10-07 NOTE — ED Notes (Signed)
Pt to CT via stretcher with RN, cardiac monitor, and RT.

## 2021-10-08 ENCOUNTER — Inpatient Hospital Stay (HOSPITAL_COMMUNITY): Payer: Medicare Other

## 2021-10-08 DIAGNOSIS — I9589 Other hypotension: Secondary | ICD-10-CM | POA: Diagnosis not present

## 2021-10-08 DIAGNOSIS — R0603 Acute respiratory distress: Secondary | ICD-10-CM

## 2021-10-08 DIAGNOSIS — N179 Acute kidney failure, unspecified: Secondary | ICD-10-CM | POA: Diagnosis not present

## 2021-10-08 DIAGNOSIS — L8990 Pressure ulcer of unspecified site, unspecified stage: Secondary | ICD-10-CM | POA: Insufficient documentation

## 2021-10-08 LAB — BASIC METABOLIC PANEL
Anion gap: 16 — ABNORMAL HIGH (ref 5–15)
Anion gap: 17 — ABNORMAL HIGH (ref 5–15)
Anion gap: 14 (ref 5–15)
BUN: 201 mg/dL — ABNORMAL HIGH (ref 8–23)
BUN: 203 mg/dL — ABNORMAL HIGH (ref 8–23)
BUN: 186 mg/dL — ABNORMAL HIGH (ref 8–23)
CO2: 16 mmol/L — ABNORMAL LOW (ref 22–32)
CO2: 17 mmol/L — ABNORMAL LOW (ref 22–32)
CO2: 20 mmol/L — ABNORMAL LOW (ref 22–32)
Calcium: 7 mg/dL — ABNORMAL LOW (ref 8.9–10.3)
Calcium: 6.7 mg/dL — ABNORMAL LOW (ref 8.9–10.3)
Calcium: 6.3 mg/dL — CL (ref 8.9–10.3)
Chloride: 118 mmol/L — ABNORMAL HIGH (ref 98–111)
Chloride: 118 mmol/L — ABNORMAL HIGH (ref 98–111)
Chloride: 118 mmol/L — ABNORMAL HIGH (ref 98–111)
Creatinine, Ser: 8 mg/dL — ABNORMAL HIGH (ref 0.61–1.24)
Creatinine, Ser: 8.13 mg/dL — ABNORMAL HIGH (ref 0.61–1.24)
Creatinine, Ser: 7.42 mg/dL — ABNORMAL HIGH (ref 0.61–1.24)
GFR, Estimated: 6 mL/min — ABNORMAL LOW (ref >60)
GFR, Estimated: 6 mL/min — ABNORMAL LOW (ref >60)
GFR, Estimated: 6 mL/min — ABNORMAL LOW (ref >60)
Glucose, Bld: 85 mg/dL (ref 70–99)
Glucose, Bld: 96 mg/dL (ref 70–99)
Glucose, Bld: 115 mg/dL — ABNORMAL HIGH (ref 70–99)
Potassium: 5.6 mmol/L — ABNORMAL HIGH (ref 3.5–5.1)
Potassium: 5.4 mmol/L — ABNORMAL HIGH (ref 3.5–5.1)
Potassium: 5 mmol/L (ref 3.5–5.1)
Sodium: 150 mmol/L — ABNORMAL HIGH (ref 135–145)
Sodium: 152 mmol/L — ABNORMAL HIGH (ref 135–145)
Sodium: 152 mmol/L — ABNORMAL HIGH (ref 135–145)

## 2021-10-08 LAB — BLOOD CULTURE ID PANEL (REFLEXED) - BCID2

## 2021-10-08 LAB — COMPREHENSIVE METABOLIC PANEL
ALT: 61 U/L — ABNORMAL HIGH (ref 0–44)
AST: 52 U/L — ABNORMAL HIGH (ref 15–41)
Albumin: 2.6 g/dL — ABNORMAL LOW (ref 3.5–5.0)
Alkaline Phosphatase: 32 U/L — ABNORMAL LOW (ref 38–126)
Anion gap: 20 — ABNORMAL HIGH (ref 5–15)
BUN: 229 mg/dL — ABNORMAL HIGH (ref 8–23)
CO2: 11 mmol/L — ABNORMAL LOW (ref 22–32)
Calcium: 6.7 mg/dL — ABNORMAL LOW (ref 8.9–10.3)
Chloride: 119 mmol/L — ABNORMAL HIGH (ref 98–111)
Creatinine, Ser: 9.32 mg/dL — ABNORMAL HIGH (ref 0.61–1.24)
GFR, Estimated: 5 mL/min — ABNORMAL LOW (ref >60)
Glucose, Bld: 91 mg/dL (ref 70–99)
Potassium: 7.2 mmol/L (ref 3.5–5.1)
Sodium: 150 mmol/L — ABNORMAL HIGH (ref 135–145)
Total Bilirubin: 0.9 mg/dL (ref 0.3–1.2)
Total Protein: 5.5 g/dL — ABNORMAL LOW (ref 6.5–8.1)

## 2021-10-08 LAB — POCT I-STAT 7, (LYTES, BLD GAS, ICA,H+H)
Acid-base deficit: 6 mmol/L — ABNORMAL HIGH (ref 0.0–2.0)
Bicarbonate: 17.6 mmol/L — ABNORMAL LOW (ref 20.0–28.0)
Calcium, Ion: 0.92 mmol/L — ABNORMAL LOW (ref 1.15–1.40)
HCT: 20 % — ABNORMAL LOW (ref 39.0–52.0)
Hemoglobin: 6.8 g/dL — CL (ref 13.0–17.0)
O2 Saturation: 100 %
Patient temperature: 36.2
Potassium: 5.3 mmol/L — ABNORMAL HIGH (ref 3.5–5.1)
Sodium: 152 mmol/L — ABNORMAL HIGH (ref 135–145)
TCO2: 18 mmol/L — ABNORMAL LOW (ref 22–32)
pCO2 arterial: 24.2 mmHg — ABNORMAL LOW (ref 32.0–48.0)
pH, Arterial: 7.466 — ABNORMAL HIGH (ref 7.350–7.450)
pO2, Arterial: 189 mmHg — ABNORMAL HIGH (ref 83.0–108.0)

## 2021-10-08 LAB — CBC
HCT: 23.3 % — ABNORMAL LOW (ref 39.0–52.0)
HCT: 22.4 % — ABNORMAL LOW (ref 39.0–52.0)
Hemoglobin: 7.6 g/dL — ABNORMAL LOW (ref 13.0–17.0)
Hemoglobin: 7.3 g/dL — ABNORMAL LOW (ref 13.0–17.0)
MCH: 28.5 pg (ref 26.0–34.0)
MCH: 29.1 pg (ref 26.0–34.0)
MCHC: 32.6 g/dL (ref 30.0–36.0)
MCHC: 32.6 g/dL (ref 30.0–36.0)
MCV: 87.3 fL (ref 80.0–100.0)
MCV: 89.2 fL (ref 80.0–100.0)
Platelets: 108 10*3/uL — ABNORMAL LOW (ref 150–400)
Platelets: 99 10*3/uL — ABNORMAL LOW (ref 150–400)
RBC: 2.67 MIL/uL — ABNORMAL LOW (ref 4.22–5.81)
RBC: 2.51 MIL/uL — ABNORMAL LOW (ref 4.22–5.81)
RDW: 14.5 % (ref 11.5–15.5)
RDW: 14.6 % (ref 11.5–15.5)
WBC: 7.1 10*3/uL (ref 4.0–10.5)
WBC: 5.3 10*3/uL (ref 4.0–10.5)
nRBC: 0 % (ref 0.0–0.2)
nRBC: 0 % (ref 0.0–0.2)

## 2021-10-08 LAB — MRSA NEXT GEN BY PCR, NASAL: MRSA by PCR Next Gen: NOT DETECTED

## 2021-10-08 LAB — ECHOCARDIOGRAM COMPLETE
Area-P 1/2: 2.99 cm2
Height: 68 in
S' Lateral: 2.1 cm
Weight: 1784.84 oz

## 2021-10-08 LAB — GLUCOSE, CAPILLARY
Glucose-Capillary: 111 mg/dL — ABNORMAL HIGH (ref 70–99)
Glucose-Capillary: 120 mg/dL — ABNORMAL HIGH (ref 70–99)
Glucose-Capillary: 133 mg/dL — ABNORMAL HIGH (ref 70–99)
Glucose-Capillary: 71 mg/dL (ref 70–99)
Glucose-Capillary: 78 mg/dL (ref 70–99)
Glucose-Capillary: 80 mg/dL (ref 70–99)
Glucose-Capillary: 80 mg/dL (ref 70–99)
Glucose-Capillary: 84 mg/dL (ref 70–99)

## 2021-10-08 LAB — CREATININE, URINE, RANDOM: Creatinine, Urine: 103.01 mg/dL

## 2021-10-08 LAB — PROTEIN / CREATININE RATIO, URINE
Creatinine, Urine: 102.11 mg/dL
Protein Creatinine Ratio: 0.37 mg/mg{Cre} — ABNORMAL HIGH (ref 0.00–0.15)
Total Protein, Urine: 38 mg/dL

## 2021-10-08 LAB — URINE CULTURE: Culture: NO GROWTH

## 2021-10-08 LAB — CK: Total CK: 1707 U/L — ABNORMAL HIGH (ref 49–397)

## 2021-10-08 LAB — RAPID URINE DRUG SCREEN, HOSP PERFORMED
Amphetamines: NOT DETECTED
Barbiturates: NOT DETECTED
Benzodiazepines: NOT DETECTED
Cocaine: NOT DETECTED
Opiates: NOT DETECTED
Tetrahydrocannabinol: NOT DETECTED

## 2021-10-08 LAB — HEMOGLOBIN A1C
Hgb A1c MFr Bld: 5.7 % — ABNORMAL HIGH (ref 4.8–5.6)
Mean Plasma Glucose: 117 mg/dL

## 2021-10-08 LAB — PHOSPHORUS
Phosphorus: 7.3 mg/dL — ABNORMAL HIGH (ref 2.5–4.6)
Phosphorus: 7.4 mg/dL — ABNORMAL HIGH (ref 2.5–4.6)

## 2021-10-08 LAB — MAGNESIUM
Magnesium: 2.9 mg/dL — ABNORMAL HIGH (ref 1.7–2.4)
Magnesium: 2.5 mg/dL — ABNORMAL HIGH (ref 1.7–2.4)
Magnesium: 2.7 mg/dL — ABNORMAL HIGH (ref 1.7–2.4)

## 2021-10-08 LAB — SODIUM, URINE, RANDOM: Sodium, Ur: 50 mmol/L

## 2021-10-08 MED ORDER — PANTOPRAZOLE SODIUM 40 MG IV SOLR
40.0000 mg | Freq: Two times a day (BID) | INTRAVENOUS | Status: DC
  Administered 2021-10-08 – 2021-10-10 (×6): 40 mg via INTRAVENOUS
  Filled 2021-10-08 (×4): qty 40

## 2021-10-08 MED ORDER — PROSOURCE TF PO LIQD
45.0000 mL | Freq: Every day | ORAL | Status: DC
  Administered 2021-10-09: 45 mL
  Filled 2021-10-08 (×2): qty 45

## 2021-10-08 MED ORDER — LACTATED RINGERS IV BOLUS
1000.0000 mL | Freq: Once | INTRAVENOUS | Status: AC
  Administered 2021-10-08: 1000 mL via INTRAVENOUS

## 2021-10-08 MED ORDER — SODIUM BICARBONATE 8.4 % IV SOLN
50.0000 meq | Freq: Once | INTRAVENOUS | Status: AC
  Administered 2021-10-08: 50 meq via INTRAVENOUS

## 2021-10-08 MED ORDER — CALCIUM GLUCONATE 10 % IV SOLN
1.0000 g | Freq: Once | INTRAVENOUS | Status: AC
  Administered 2021-10-08: 1 g via INTRAVENOUS
  Filled 2021-10-08: qty 10

## 2021-10-08 MED ORDER — VITAL 1.5 CAL PO LIQD
1000.0000 mL | ORAL | Status: DC
  Administered 2021-10-08: 1000 mL

## 2021-10-08 MED ORDER — INSULIN ASPART 100 UNIT/ML IV SOLN
5.0000 [IU] | Freq: Once | INTRAVENOUS | Status: AC
  Administered 2021-10-08: 5 [IU] via INTRAVENOUS

## 2021-10-08 MED ORDER — SODIUM CHLORIDE 0.45 % IV SOLN: INTRAVENOUS | Status: AC

## 2021-10-08 MED ORDER — DEXTROSE 50 % IV SOLN
1.0000 | Freq: Once | INTRAVENOUS | Status: AC
  Administered 2021-10-08: 50 mL via INTRAVENOUS

## 2021-10-08 MED ORDER — CALCIUM GLUCONATE-NACL 2-0.675 GM/100ML-% IV SOLN
2.0000 g | Freq: Once | INTRAVENOUS | Status: AC
  Administered 2021-10-08: 2000 mg via INTRAVENOUS
  Filled 2021-10-08: qty 100

## 2021-10-08 MED ORDER — FREE WATER
400.0000 mL | Freq: Four times a day (QID) | Status: DC
  Administered 2021-10-08 – 2021-10-10 (×8): 400 mL

## 2021-10-08 NOTE — Consult Note (Signed)
WOC Nurse Consult Note: Patient receiving care in [place]. Primary nurse assisted with turning for sacral assessment. Reason for Consult: multiple wounds Wound type: Evolving DTPI to sacrum. Wound is darkened with partial loss of overlying tissue in one spot. The wound measures 4 cm x 4.5 cm. There is a foam dressing over the area; this dressing should be continued and I have ordered for that. The left knee has an intact DTPI that measures 5 cm x 6 cm. There is a foam dressing over the area. This should be continued and I have ordered for that. The right knee has an evolving DTPI that measures 5.3 cm x 8 cm with a narrow sliver of slough along the medial margin. There is a foam dressing in place and this should be continued. I have ordered for this.  There are scattered abrasions along the chest--nothing that needs any type of dressing at this time.  I have ordered for bilateral heel lift Prevalon boots. Pressure Injury POA: Yes Measurement: Wound bed: Drainage (amount, consistency, odor)  Periwound: Dressing procedure/placement/frequency: Monitor the wound area(s) for worsening of condition such as: Signs/symptoms of infection,  Increase in size,  Development of or worsening of odor, Development of pain, or increased pain at the affected locations.  Notify the medical team if any of these develop.  Thank you for the consult.  Discussed plan of care with the bedside nurse.  [place] nurse will not follow at this time.  Please re-consult the [facility] team if needed.  Val Riles, RN, MSN, CWOCN, CNS-BC, pager [phone]

## 2021-10-08 NOTE — Progress Notes (Signed)
[facility] NEPHROLOGY PROGRESS NOTE  Assessment/ Plan: Pt is a 87 y.o. yo male  PMH significant for DM type 2, HTN, BPH, HLD, pyloric stenosis, h/o + PPD, and CKD stage IV who was found down in his home, unresponsive, seen as a consultation for AKI on CKD and hyperkalemia.  Seen by Dr. Justin Mend in 10/2020 when creatinine level was 3.1.  #Acute kidney injury on CKD stage IV, nonoliguric: Multifactorial etiology including ischemic ATN due to severe dehydration, BOO versus progression of underlying CKD.  Patient had about a liter of urine output after insertion of Foley catheter which was difficult.  The creatinine level is trending down with IV fluid.  The acidosis is managed with sodium bicarbonate.  I will add free water from the tube for the management of hyponatremia.  Urinalysis with few RBC and protein creatinine ratio 0.3.  SPEP and kappa lambda ratio pending.  Kidney ultrasound with chronic findings and cyst.  He is still looking dry therefore I will order 1 L of IV fluid.  Given his advanced ages, comorbidities including subdural hematoma, I think he will do poorly with dialysis therefore I will not recommended it.  I recommend palliative care consult to discuss goals of care.  #Hypernatremia, hypovolemic: Exam consistent with dehydration.  Treating with IV fluid as above.  Starting free water from the tube.  Discussed with ICU nurse.  #Hyperkalemia: Managing medically.  Monitor lab.  #Anion gap metabolic acidosis: Continue IV sodium bicarbonate.  Monitor lab.  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  Currently in ICU, intubated.  Monitor neuro status.  #Ventilator dependent respiratory failure: Currently on ventilation.  Managed by PCCM.  #Hypotension improved with IV fluid.  Discussed with ICU team.  Subjective: Seen and examined ICU.  Patient is currently intubated and not responding.  Urine output is recorded  around 1.9 L.  Unable to obtain review of system. Objective Vital signs in last 24 hours: Vitals:   10/08/21 0558 10/08/21 0600 10/08/21 0700 10/08/21 0732  BP:   (!) 112/44   Pulse:  80 81 70  Resp:  (!) 21 16 20   Temp:  (!) 96.3 F (35.7 C) (!) 96.3 F (35.7 C)   TempSrc:      SpO2: 100% 96% 100% 100%  Weight:      Height:       Weight change:   Intake/Output Summary (Last 24 hours) at 10/08/2021 0942 Last data filed at 10/08/2021 0900 Gross per 24 hour  Intake 4148.52 ml  Output 2135 ml  Net 2013.52 ml       Labs: Basic Metabolic Panel: Recent Labs  Lab 10/07/21 2259 10/08/21 0240 10/08/21 0450 10/08/21 0541  NA 151* 150* 152* 152*  K 6.0* 5.6* 5.3* 5.4*  CL 119* 118*  --  118*  CO2 14* 16*  --  17*  GLUCOSE 82 85  --  96  BUN 210* 201*  --  203*  CREATININE 8.43* 8.00*  --  8.13*  CALCIUM 6.7* 7.0*  --  6.7*  PHOS 8.6* 7.3*  --   --    Liver Function Tests: Recent Labs  Lab 10/07/21 1805 10/07/21 2259  AST 52*  --   ALT 61*  --   ALKPHOS 32*  --   BILITOT 0.9  --   PROT 5.5*  --   ALBUMIN 2.6* 2.4*   No results for input(s): LIPASE, AMYLASE in the last 168 hours. Recent Labs  Lab 10/07/21 1805  AMMONIA 13   CBC: Recent Labs  Lab 10/07/21 1805 10/07/21 1812 10/07/21 2115 10/08/21 0240 10/08/21 0450 10/08/21 0541  WBC 10.2  --  10.0 7.1  --  5.3  NEUTROABS 9.6*  --   --   --   --   --   HGB 8.4*   < > 7.2* 7.6* 6.8* 7.3*  HCT 27.0*   < > 23.3* 23.3* 20.0* 22.4*  MCV 94.1  --  92.5 87.3  --  89.2  PLT 110*  --  95* 108*  --  99*   < > = values in this interval not displayed.   Cardiac Enzymes: Recent Labs  Lab 10/07/21 1805 10/08/21 0240  CKTOTAL 1,839* 1,707*   CBG: Recent Labs  Lab 10/08/21 0032 10/08/21 0108 10/08/21 0344 10/08/21 0600 10/08/21 0714  GLUCAP 71 133* 80 78 80    Iron Studies: No results for input(s): IRON, TIBC, TRANSFERRIN, FERRITIN in the last 72 hours. Studies/Results: CT HEAD WO  CONTRAST  Result Date: 10/07/2021 CLINICAL DATA:  Neurologic deficit. EXAM: CT HEAD WITHOUT CONTRAST TECHNIQUE: Contiguous axial images were obtained from the base of the skull through the vertex without intravenous contrast. COMPARISON:  None. FINDINGS: Brain: Moderate age-related atrophy and chronic microvascular ischemic changes. 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 There is however a smaller high attenuating area within these collection over the right parietal lobe (22/4) concerning for an acute or subacute subdural hemorrhage. This measures approximately 4 mm in thickness. There is associated mass effect and 6 mm right to left midline shift. Vascular: No hyperdense vessel or unexpected calcification. Skull: Normal. Negative for fracture or focal lesion. Sinuses/Orbits: The visualized paranasal sinuses and mastoid air cells are clear. Other: None IMPRESSION: 1. 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 Mass effect on the right brain with approximately 6 mm right to left midline shift. 2. Moderate age-related atrophy and chronic microvascular ischemic changes. These results were called by telephone at the time of interpretation on 10/07/2021 at 8:46 pm to Dr. Melina Copa, who verbally acknowledged these results. Electronically Signed   By: Anner Crete M.D.   On: 10/07/2021 20:52   CT CERVICAL SPINE WO CONTRAST  Result Date: 10/08/2021 CLINICAL DATA:  Fall EXAM: CT CERVICAL SPINE WITHOUT CONTRAST TECHNIQUE: Multidetector CT imaging of the cervical spine was performed without intravenous contrast. Multiplanar CT image reconstructions were also generated. COMPARISON:  None. FINDINGS: Alignment: No static subluxation. Facets are aligned. Occipital condyles and the lateral masses of C1 and C2 are normally approximated. Skull base and vertebrae: No  acute fracture. Soft tissues and spinal canal: No prevertebral fluid or swelling. No visible canal hematoma. Disc levels: No advanced spinal canal or neural foraminal stenosis. Upper chest: No pneumothorax, pulmonary nodule or pleural effusion. Other: Normal visualized paraspinal cervical soft tissues. IMPRESSION: No acute fracture or static subluxation of the cervical spine. Electronically Signed   By: Ulyses Jarred M.D.   On: 10/08/2021 03:53   US RENAL  Result Date: 10/07/2021 CLINICAL DATA:  Initial evaluation for acute kidney injury. EXAM: RENAL / URINARY TRACT ULTRASOUND COMPLETE COMPARISON:  Prior CT from 11/05/2018. FINDINGS: Right Kidney: Renal measurements: 8.6 x 3.4 x 5.1 cm = volume: 76.2 mL. Right kidney is somewhat small with markedly increased echogenicity within the renal parenchyma. Poor corticomedullary differentiation. No visible nephrolithiasis. Minimal pelviectasis without hydronephrosis. No focal renal  mass. Left Kidney: Renal measurements: 8.6 x 4.9 x 4.1 cm = volume: 89.7 mL. Left kidney is somewhat small with increased echogenicity within the renal parenchyma. Poor corticomedullary differentiation. No nephrolithiasis or hydronephrosis. 1.3 x 0.8 x 0.9 cm simple cyst present at the lower pole. Additional 1.4 x 1.3 x 1.2 cm minimally complex but benign appearing cyst at the upper pole. Additional small 1.1 cm simple cyst at the upper pole. Bladder: Decompressed with a Foley catheter in place. Other: None. IMPRESSION: 1. Increased echogenicity within the renal parenchyma, consistent with medical renal disease. 2. No hydronephrosis. 3. Few benign-appearing left renal cysts measuring up to 1.4 cm as above. 4. Foley catheter in place with decompression of the bladder. Electronically Signed   By: Jeannine Boga M.D.   On: 10/07/2021 22:55   DG Chest Portable 1 View  Result Date: 10/07/2021 CLINICAL DATA:  Intubated EXAM: PORTABLE CHEST 1 VIEW COMPARISON:  None. FINDINGS:  Endotracheal tube tip is about 3.8 cm superior to carina. No esophageal visible on the included portions of the chest. Lung fields are clear. Normal cardiomediastinal silhouette with aortic atherosclerosis. No pneumothorax IMPRESSION: 1. Endotracheal tube tip about 3.8 cm superior to carina 2. No visible nasogastric tube on the included portions of the chest Electronically Signed   By: Donavan Foil M.D.   On: 10/07/2021 18:45    Medications: Infusions:  sodium chloride Stopped (10/07/21 2059)   sodium bicarbonate 150 mEq in D5W infusion 100 mL/hr at 10/08/21 0859    Scheduled Medications:  chlorhexidine gluconate (MEDLINE KIT)  15 mL Mouth Rinse BID   Chlorhexidine Gluconate Cloth  6 each Topical Daily   insulin aspart  0-6 Units Subcutaneous Q4H   mouth rinse  15 mL Mouth Rinse 10 times per day   pantoprazole (PROTONIX) IV  40 mg Intravenous Q12H    have reviewed scheduled and prn medications.  Physical Exam: General: Critically ill looking male intubated, not responding Heart:RRR, s1s2 nl Lungs: Coarse breath sound, Abdomen:soft,  non-distended Extremities:No edema Neurology: Not responsive. Anamaria Dusenbury Prasad Rayyan Burley 10/08/2021,9:42 AM  LOS: 1 day

## 2021-10-08 NOTE — Progress Notes (Signed)
Initial Nutrition Assessment  DOCUMENTATION CODES:   Underweight, suspect severe malnutrition  INTERVENTION:   Once OG tube placement confirmed, initiate tube feeds: - Start Vital 1.5 @ 20 ml/hr and advance by 10 ml q 8 hours to goal rate of 45 ml/hr (1080 ml/day) - ProSource TF 45 ml daily - Free water flushes per MD, currently 400 ml q 6 hours  Tube feeding regimen at goal provides 1660 kcal, 84 grams of protein, and 825 ml of H2O.   Total free water with current flushes: 2425 ml  Monitor magnesium, potassium, and phosphorus BID for at least 3 days, MD to replete as needed, as pt is at risk for refeeding syndrome given likely severe malnutrition, inadequate PO intake for several days.  NUTRITION DIAGNOSIS:   Inadequate oral intake related to inability to eat as evidenced by NPO status.  GOAL:   Patient will meet greater than or equal to 90% of their needs  MONITOR:   Vent status, Labs, Weight trends, TF tolerance, Skin, I & O's  REASON FOR ASSESSMENT:   Ventilator, Consult Enteral/tube feeding initiation and management  ASSESSMENT:   87 year old male who presented to the ED on 11/28 after being found down at home. Pt required intubation in the ED. PMH of HTN, HLD, CKD, T2DM, BPH, pyloric stenosis, CKD IV. Pt admitted with AKI, acute encephalopathy, and hypoxic respiratory failure. CT head showing acute on chronic SDH.  RD consulted for tube feeding initiation and management. Pt with OG tube in place, abdominal x-ray obtained to confirm gastric placement.  Nephrology following and do not recommend dialysis at this time. Nephrology recommending palliative care consult to discuss [place]. MD has ordered free water flushes of 400 ml q 6 hours per tube.  Unable to obtain diet and weight history at this time. No available weight history in chart.  Patient is currently intubated on ventilator support MV: 16.4 L/min Temp (24hrs), Avg:93.8 F (34.3 C), Min:87.9 F (31.1 C),  Max:99.1 F (37.3 C) BP (cuff): 97/45 MAP (cuff): 62  Drips: Sodium bicarb: 100 ml/hr  Medications reviewed and include: SSI q 4 hours, IV protonix  Labs reviewed: sodium 152, potassium 5.4, BUN 203, creatinine 8.13, ionized calcium 0.92, phosphorus 7.3, magnesium 2.5, elevated LFTs, hemoglobin 7.3, platelets 99 CBG's: 71-133 x 24 hours  UOP: 1900 ml x 12 hours I/O's: +1.9 L since admit  NUTRITION - FOCUSED PHYSICAL EXAM:  Unable to complete at this time. RD working remotely.  Diet Order:   Diet Order             Diet NPO time specified  Diet effective now                   EDUCATION NEEDS:   Not appropriate for education at this time  Skin:  Skin Assessment: Skin Integrity Issues: DTI: R knee Stage II: coccyx Other: abrasions to sternum, L knee  Last BM:  10/08/21 medium type 6  Height:   Ht Readings from Last 1 Encounters:  10/07/21 5\' 8"  (1.727 m)    Weight:   Wt Readings from Last 1 Encounters:  10/08/21 50.6 kg    BMI:  Body mass index is 16.96 kg/m.  Estimated Nutritional Needs:   Kcal:  1550-1750  Protein:  75-90 grams  Fluid:  1.5-1.7 L    Gustavus Bryant, MS, RD, LDN Inpatient Clinical Dietitian Please see [facility] for contact information.

## 2021-10-08 NOTE — Progress Notes (Signed)
  Echocardiogram 2D Echocardiogram has been performed.  Merrie Roof F 10/08/2021, 4:58 PM

## 2021-10-08 NOTE — Progress Notes (Signed)
E Link notified. Patient is having runs of [place]. Appears to be increasing in frequency. Acquiring EKG at this time. Will continue to monitor.

## 2021-10-08 NOTE — Progress Notes (Signed)
PHARMACY - PHYSICIAN COMMUNICATION CRITICAL VALUE ALERT - BLOOD CULTURE IDENTIFICATION (BCID)  Ryan Cortez is an 87 y.o. male who presented to [facility] on 10/07/2021 after being found down.   Assessment:  Patient minimally responsive, covered in stool, last seen on 11/15. Patient with possible rhabdomyolysis with scr >7 currently. 1/2 blood cultures grown GPC/staph not speciated. Discussed with CCM and will watch closely and consider antibiotics if staph speciates. Patient loaded with vancomycin on 11/28 that would provide coverage for 48-72 hours based on renal function.   Name of physician (or Provider) Contacted: Hunsucker MD  Current antibiotics: none  Changes to prescribed antibiotics recommended:  Will continue to watch for signs and symptoms of infection. Patient loaded with antibiotics on 11/28 that are still in system.   No results found for this or any previous visit.  Erin Hearing PharmD., BCPS Clinical Pharmacist 10/08/2021 4:58 PM

## 2021-10-08 NOTE — Consult Note (Signed)
Neurosurgery Consultation  Reason for Consult: SDH Referring Physician: Gleason  CC: AMS  HPI: This is a 87 y.o. man that presents to the hospital after being found down, LKN was a few weeks ago, was living independentaly, found to have severe AKI, K of 7.2, Cr of 9, CK of 1800, hypotensive and hypothermic at arrival. Given his AMS, CTH was obtained which showed an acute on chronic subdural collection. Further hx limited due to pt's AMS, but reportedly not anticoagulated prior to this.   ROS: A 14 point ROS was performed and is negative except as noted in the HPI.   PMHx: No past medical history on file. FamHx: No family history on file. SocHx:  has no history on file for tobacco use, alcohol use, and drug use.  Exam: Vital signs in last 24 hours: Temp:  [87.9 F (31.1 C)-99.1 F (37.3 C)] 96.3 F (35.7 C) (11/29 0700) Pulse Rate:  [25-153] 70 (11/29 0732) Resp:  [14-28] 20 (11/29 0732) BP: (78-162)/(33-88) 112/44 (11/29 0700) SpO2:  [60 %-100 %] 100 % (11/29 0732) FiO2 (%):  [40 %-100 %] 40 % (11/29 0732) Weight:  [50.6 kg-54.4 kg] 50.6 kg (11/29 0500) General: intubated, laying in hospital bed, appears acutely on chronically ill Head: Normocephalic and atruamatic HEENT: Neck supple Pulmonary: intubated, chest rise symmetric Cardiac: bradycardic Abdomen: S NT ND Extremities: Warm and well perfused x4, dec'd bulk Neuro: intubated, opens and closes eyes reliably to voice / following axial commands, +cataracts OU, pupils 37mm OU and conjugate gaze, does not follow appendicular commands but MAE x4 to stim and localizes   Assessment and Plan: 87 y.o. man found down with rhabdo / severe AKI / AMS. [place] personally reviewed, which shows a 24mm R chronic subdural hypodense collection with 74mm MLS, likely subacute versus acute small component posteriorly, severe atrophy.   -no acute neurosurgical intervention indicated at this time, given the degree of midline shift and patent cisterns /  etc, suspect this is not significantly contributing to his AMS and he is a poor operative candidate given his acute illness, frailty, and age.  -CT C-spine reviewed, no fracture / subluxation, I d/c'd his cervical collar on arounds -please call with any concerns or questions  Judith Part, MD 10/08/21 9:17 AM [facility]

## 2021-10-08 NOTE — Progress Notes (Signed)
NAME:  Ryan Cortez, MRN:  [id number], DOB:  Mar 27, 1927, LOS: 1 ADMISSION DATE:  10/07/2021, CONSULTATION DATE:  10/08/21 REFERRING MD:  EDP, CHIEF COMPLAINT:  AMS  History of Present Illness:  Ryan Cortez is a 87 y/o M with PMH significant for HTN, HL, CKD, DM who was found down in his home minimally responsive and covered in dark stool.  Daughter and patient's friend are at the bedside and state they last saw him on 11/15 and that someone working at his apartment complex must have called EMS.  He is very independent at baseline and still drives, though refuses to use his cane.  Pt was intubated on arrival. Work-up significant for renal insufficiency with creatinine of 9.3 and K of 7.2, Na 150 and CK 1800.  CXR without significant infiltrate.  He was given IVF, broad spectrum abx and temporizing measures for hyperkalemia and PCCM consulted. 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 but with  6mm area acute or subacute subdural with 36mm midline shift  Pertinent  Medical History         Past Medical History:  Diagnosis Date   Anemia      iron Rxed by Dr Ardis Hughs   Arthritis     BPH (benign prostatic hyperplasia)     Diabetes ([place])     Fasting hyperglycemia     Hyperlipidemia     Hypertension     Other abnormal glucose 2008    A1c 6.1%   Positive PPD 1991    no INH prophylaxis due to age   PVD (peripheral vascular disease) ([place])     Pyloric stenosis     Renal insufficiency      Dr Justin Mend     [facility] Events: Including procedures, antibiotic start and stop dates in addition to other pertinent events   11/28 Found down, intubated and given Cefepime/Vanc/Flagyl 11/29 UOP present    Lines/Tubes 11/28 ETT   Significant Imaging 11/28 CT head>Probable small acute or subacute right parietal subdural hemorrhage on a moderate-sized right hemispheric 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 Mass effect on the right brain with approximately 6 mm right to left midline  shift.  Interim History / Subjective:  Following commands, Cr 8 slightly down, UOP ok, K stable, CT cervical spine benign  Objective   Blood pressure (!) 112/44, pulse 70, temperature (!) 96.3 F (35.7 C), resp. rate 20, height 5\' 8"  (1.727 m), weight 50.6 kg, SpO2 100 %.    Vent Mode: PSV;CPAP FiO2 (%):  [40 %-100 %] 40 % Set Rate:  [18 bmp] 18 bmp Vt Set:  [500 mL-540 mL] 500 mL PEEP:  [5 cmH20] 5 cmH20 Pressure Support:  [5 cmH20] 5 cmH20 Plateau Pressure:  [11 cmH20-17 cmH20] 11 cmH20   Intake/Output Summary (Last 24 hours) at 10/08/2021 1027 Last data filed at 10/08/2021 0900 Gross per 24 hour  Intake 4148.52 ml  Output 2135 ml  Net 2013.52 ml    Filed Weights   10/07/21 1804 10/07/21 2229 10/08/21 0500  Weight: 54.4 kg 50.6 kg 50.6 kg   General:  poorly nourished elderly M, intubated and unresponsive HEENT: MM pink/dry, pupils 94mm bilaterally and responsive to light, sclera anicteric Neuro:  intubated on no sedation, grimaces to pain, + corneals, otherwise does not respond to voice, no noted purposeful movement CV: s1s2 rrr, no m/r/g PULM:  mechanically ventilated with decreased air movement in bilateral bases  GI: soft, bsx4 active  Extremities: cool/dry, extremely dry skin without  edema  Skin: no rashes or lesions   Resolved Hospital Problem list     Assessment & Plan:    Acute Encephalopathy and Hypoxic Respiratory Failure Acute vs. 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 -HOB elevated 30 degrees.  Acute renal failure on CKD4 with hyperkalemia Possible  Rhabdomyolysis AGMA Hypernatremia Creatinine >9 with last baseline in Epic ~3 last year BUN 229 Lactic acid 1.2 -appreciate nephrology consult, pt urinated >1L immediately after foley placed -no urgent indication for dialysis -renal US without hydronephrosis -monitor I&O -avoid nephrotoxins and renally dose meds -bicarb gtt -trend CK -received insulin and Ca - Free water via OG (needs placed)  Hypotension and Hypothermia Improving with IVF and bair hugger -consider sepsis, though no clear source and lactic acid WNL -abx stopped AM 11/29  Baseline HTN, HL -holding home Lasix and Norvasc in the setting of the above -obtain echo  Protein Calorie Malnutrition POA -will need nutrition consult and likely TF pending neurosurgery eval   Best Practice (right click and "Reselect all SmartList Selections" daily)   Diet/type: NPO DVT prophylaxis: SCD GI prophylaxis: PPI Lines: N/A Foley:  Yes, and it is still needed Code Status:  full code Last date of multidisciplinary goals of care discussion [discussed with Pt's daughter and friend. Pt had no advanced directives and they had not discussed life support, want aggressive care and full code at this time]  Labs   CBC: Recent Labs  Lab 10/07/21 1805 10/07/21 1812 10/07/21 1839 10/07/21 2115 10/08/21 0240 10/08/21 0450 10/08/21 0541  WBC 10.2  --   --  10.0 7.1  --  5.3  NEUTROABS 9.6*  --   --   --   --   --   --   HGB 8.4*   < > 8.2* 7.2* 7.6* 6.8* 7.3*  HCT 27.0*   < > 24.0* 23.3* 23.3* 20.0* 22.4*  MCV 94.1  --   --  92.5 87.3  --  89.2  PLT 110*  --   --  95* 108*  --  99*   < > = values in this interval not displayed.     Basic Metabolic Panel: Recent Labs  Lab 10/07/21 1805 10/07/21 1812 10/07/21 1839 10/07/21 2115 10/07/21 2259 10/08/21 0240 10/08/21 0450 10/08/21 0541  NA 150* 148* 148*  --  151* 150* 152* 152*  K 7.2* 6.7* 6.5*  --  6.0* 5.6* 5.3* 5.4*  CL 119* 122*  --   --  119* 118*  --  118*   CO2 11*  --   --   --  14* 16*  --  17*  GLUCOSE 91 88  --   --  82 85  --  96  BUN 229* >130*  --   --  210* 201*  --  203*  CREATININE 9.32* 10.30*  --  8.88* 8.43* 8.00*  --  8.13*  CALCIUM 6.7*  --   --   --  6.7* 7.0*  --  6.7*  MG  --   --   --   --  2.9* 2.5*  --   --   PHOS  --   --   --   --  8.6* 7.3*  --   --     GFR: Estimated Creatinine Clearance: 4.1 mL/min (A) (by C-G formula based on SCr of 8.13 mg/dL (H)). Recent Labs  Lab 10/07/21 1805 10/07/21 2115 10/08/21 0240 10/08/21 0541  WBC 10.2 10.0 7.1 5.3  LATICACIDVEN 1.2  --   --   --      Liver Function Tests: Recent Labs  Lab 10/07/21 1805 10/07/21 2259  AST 52*  --   ALT 61*  --   ALKPHOS 32*  --   BILITOT 0.9  --   PROT 5.5*  --   ALBUMIN 2.6* 2.4*    No results for input(s): LIPASE, AMYLASE in the last 168 hours. Recent Labs  Lab 10/07/21 1805  AMMONIA 13     ABG    Component Value Date/Time   PHART 7.466 (H) 10/08/2021 0450   PCO2ART 24.2 (L) 10/08/2021 0450   PO2ART 189 (H) 10/08/2021 0450   HCO3 17.6 (L) 10/08/2021 0450   TCO2 18 (L) 10/08/2021 0450   ACIDBASEDEF 6.0 (H) 10/08/2021 0450   O2SAT 100.0 10/08/2021 0450      Coagulation Profile: Recent Labs  Lab 10/07/21 1805  INR 1.3*     Cardiac Enzymes: Recent Labs  Lab 10/07/21 1805 10/08/21 0240  CKTOTAL 1,839* 1,707*     HbA1C: No results found for: HGBA1C  CBG: Recent Labs  Lab 10/08/21 0032 10/08/21 0108 10/08/21 0344 10/08/21 0600 10/08/21 0714  GLUCAP 71 133* 80 78 80     Review of Systems:   Unable to obtain secondary to mental status  Past Medical History:  He,  has no past medical history on file.   Surgical History:     Social History:      Family History:  His family history is not on file.   Allergies No Known Allergies   Home Medications  Prior to Admission medications   Medication Sig Start Date End Date Taking? Authorizing Provider  dorzolamide (TRUSOPT) 2 % ophthalmic  solution Place 1 drop into the left eye 2 (two) times daily.   Yes [provider]  prednisoLONE acetate (PRED FORTE) 1 % ophthalmic suspension Place 1 drop into the right eye in the morning and at bedtime.   Yes [provider]  amLODipine (NORVASC) 10 MG tablet Take 10 mg by mouth daily.    [provider]  finasteride (PROSCAR) 5 MG tablet Take 5 mg by mouth daily.    [provider]  furosemide (LASIX) 40 MG tablet Take 40 mg by mouth daily.    [provider]  losartan (COZAAR) 100 MG tablet Take 100 mg by mouth daily.    [provider]  tamsulosin (FLOMAX) 0.4 MG CAPS capsule Take 0.4 mg by mouth daily.    [provider]     Critical care time:     CRITICAL CARE Performed by: Lanier Clam   Total critical care time: 35 minutes  Critical care time was exclusive of separately billable procedures and treating other patients.  Critical care was necessary to treat or prevent imminent or life-threatening deterioration.  Critical care was time spent personally by me on the following activities: development of treatment plan with patient and/or surrogate as well as nursing, discussions with consultants, evaluation of patient's response to treatment, examination of patient, obtaining history from patient or surrogate, ordering and performing treatments and interventions, ordering and review of laboratory studies, ordering and review of radiographic studies, pulse oximetry and re-evaluation of  patient's condition.  Lanier Clam, MD [facility] See Amion for contact info If no response to pager , please call [phone] until 7pm After 7:00 pm call Elink  [phone]?[place]

## 2021-10-08 NOTE — ED Notes (Signed)
Pt clothes were cut off on arrival and thrown in to  trash can due to being covered with feces and urine. Pt only belongings are dentures and are at the bedside.

## 2021-10-09 DIAGNOSIS — N179 Acute kidney failure, unspecified: Secondary | ICD-10-CM | POA: Diagnosis not present

## 2021-10-09 LAB — CBC WITH DIFFERENTIAL/PLATELET
Abs Immature Granulocytes: 0.04 10*3/uL (ref 0.00–0.07)
Basophils Absolute: 0 10*3/uL (ref 0.0–0.1)
Basophils Relative: 0 %
Eosinophils Absolute: 0 10*3/uL (ref 0.0–0.5)
Eosinophils Relative: 0 %
HCT: 22.3 % — ABNORMAL LOW (ref 39.0–52.0)
Hemoglobin: 7.4 g/dL — ABNORMAL LOW (ref 13.0–17.0)
Immature Granulocytes: 1 %
Lymphocytes Relative: 5 %
Lymphs Abs: 0.4 10*3/uL — ABNORMAL LOW (ref 0.7–4.0)
MCH: 29.5 pg (ref 26.0–34.0)
MCHC: 33.2 g/dL (ref 30.0–36.0)
MCV: 88.8 fL (ref 80.0–100.0)
Monocytes Absolute: 0.3 10*3/uL (ref 0.1–1.0)
Monocytes Relative: 4 %
Neutro Abs: 7.3 10*3/uL (ref 1.7–7.7)
Neutrophils Relative %: 90 %
Platelets: 83 10*3/uL — ABNORMAL LOW (ref 150–400)
RBC: 2.51 MIL/uL — ABNORMAL LOW (ref 4.22–5.81)
RDW: 14.6 % (ref 11.5–15.5)
WBC: 8.1 10*3/uL (ref 4.0–10.5)
nRBC: 0 % (ref 0.0–0.2)

## 2021-10-09 LAB — CULTURE, BLOOD (ROUTINE X 2)

## 2021-10-09 LAB — GLUCOSE, CAPILLARY
Glucose-Capillary: 104 mg/dL — ABNORMAL HIGH (ref 70–99)
Glucose-Capillary: 118 mg/dL — ABNORMAL HIGH (ref 70–99)
Glucose-Capillary: 119 mg/dL — ABNORMAL HIGH (ref 70–99)
Glucose-Capillary: 120 mg/dL — ABNORMAL HIGH (ref 70–99)
Glucose-Capillary: 124 mg/dL — ABNORMAL HIGH (ref 70–99)
Glucose-Capillary: 87 mg/dL (ref 70–99)
Glucose-Capillary: 96 mg/dL (ref 70–99)

## 2021-10-09 LAB — PHOSPHORUS
Phosphorus: 5.3 mg/dL — ABNORMAL HIGH (ref 2.5–4.6)
Phosphorus: 5.2 mg/dL — ABNORMAL HIGH (ref 2.5–4.6)

## 2021-10-09 LAB — BASIC METABOLIC PANEL
Anion gap: 12 (ref 5–15)
BUN: 167 mg/dL — ABNORMAL HIGH (ref 8–23)
CO2: 21 mmol/L — ABNORMAL LOW (ref 22–32)
Calcium: 6.5 mg/dL — ABNORMAL LOW (ref 8.9–10.3)
Chloride: 113 mmol/L — ABNORMAL HIGH (ref 98–111)
Creatinine, Ser: 6.32 mg/dL — ABNORMAL HIGH (ref 0.61–1.24)
GFR, Estimated: 8 mL/min — ABNORMAL LOW (ref >60)
Glucose, Bld: 132 mg/dL — ABNORMAL HIGH (ref 70–99)
Potassium: 4.4 mmol/L (ref 3.5–5.1)
Sodium: 146 mmol/L — ABNORMAL HIGH (ref 135–145)

## 2021-10-09 LAB — KAPPA/LAMBDA LIGHT CHAINS
Kappa free light chain: 124.8 mg/L — ABNORMAL HIGH (ref 3.3–19.4)
Kappa, lambda light chain ratio: 0.68 (ref 0.26–1.65)
Lambda free light chains: 183.5 mg/L — ABNORMAL HIGH (ref 5.7–26.3)

## 2021-10-09 LAB — MAGNESIUM
Magnesium: 2.4 mg/dL (ref 1.7–2.4)
Magnesium: 2.3 mg/dL (ref 1.7–2.4)

## 2021-10-09 MED ORDER — SODIUM CHLORIDE 0.45 % IV SOLN: INTRAVENOUS | Status: DC

## 2021-10-09 MED ORDER — FENTANYL CITRATE (PF) 100 MCG/2ML IJ SOLN
INTRAMUSCULAR | Status: AC
  Filled 2021-10-09: qty 2

## 2021-10-09 MED ORDER — CALCIUM GLUCONATE-NACL 1-0.675 GM/50ML-% IV SOLN
1.0000 g | Freq: Once | INTRAVENOUS | Status: AC
  Administered 2021-10-09: 1000 mg via INTRAVENOUS
  Filled 2021-10-09: qty 50

## 2021-10-09 NOTE — Progress Notes (Addendum)
[facility] NEPHROLOGY PROGRESS NOTE  Assessment/ Plan: Pt is a 87 y.o. yo male  PMH significant for DM type 2, HTN, BPH, HLD, pyloric stenosis, h/o + PPD, and CKD stage IV who was found down in his home, unresponsive, seen as a consultation for AKI on CKD and hyperkalemia.  Seen by Dr. Justin Mend in 10/2020 when creatinine level was 3.1.  #Acute kidney injury on CKD stage IV, nonoliguric: Multifactorial etiology including ischemic ATN due to severe dehydration, BOO versus progression of underlying CKD. Urinalysis with few RBC and protein creatinine ratio 0.3.  SPEP and kappa lambda ratio pending.  Kidney ultrasound with chronic findings and cyst.  Foley catheter was inserted and treated with IV fluid.  The creatinine level is trending down and he has urine output of around 1.5 L.  I will continue free water for the management of hyponatremia. Given his advanced ages, comorbidities including subdural hematoma, I think he will do poorly with dialysis therefore I will not recommended it.  Fortunately, he is having renal recovery and no need for dialysis at this time.  I recommend palliative care consult to discuss goals of care. Start half NS at 75 cc an hour.  #Hypernatremia, hypovolemic: Exam consistent with dehydration.  Improving with free water.  Continue and monitor lab.  #Hyperkalemia: Improved with medical management.  #Anion gap metabolic acidosis: Improving, off of IV sodium bicarbonate.  Monitor serum CO2 level.  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  Currently in ICU, intubated.  Seen by neurosurgeon, not operative candidate.  Monitor neuro status.  #Hypocalcemia: Continue to replete IV calcium.  Monitor lab.  #Ventilator dependent respiratory failure: Currently on ventilation.  Managed by PCCM.  #Hypotension improved with IV fluid.  Discussed with ICU team.  Subjective: Seen and examined ICU.  Urine output is  around 1.5 L.  Lab improving.  He remains intubated and not responding. Objective Vital signs in last 24 hours: Vitals:   10/09/21 0400 10/09/21 0500 10/09/21 0717 10/09/21 0724  BP: (!) 116/56 117/87  (!) 102/56  Pulse: 92 81  66  Resp: 19 14  18   Temp: (!) 97.3 F (36.3 C) (!) 97.5 F (36.4 C) 97.7 F (36.5 C)   TempSrc: Bladder  Bladder   SpO2: 100% 100%  100%  Weight:  54.6 kg    Height:       Weight change: 0.168 kg  Intake/Output Summary (Last 24 hours) at 10/09/2021 0926 Last data filed at 10/09/2021 0600 Gross per 24 hour  Intake 2683.7 ml  Output 1250 ml  Net 1433.7 ml        Labs: Basic Metabolic Panel: Recent Labs  Lab 10/08/21 0240 10/08/21 0450 10/08/21 0541 10/08/21 1324 10/08/21 1556 10/09/21 0436  NA 150*   < > 152* 152*  --  146*  K 5.6*   < > 5.4* 5.0  --  4.4  CL 118*  --  118* 118*  --  113*  CO2 16*  --  17* 20*  --  21*  GLUCOSE 85  --  96 115*  --  132*  BUN 201*  --  203* 186*  --  167*  CREATININE 8.00*  --  8.13* 7.42*  --  6.32*  CALCIUM 7.0*  --  6.7* 6.3*  --  6.5*  PHOS 7.3*  --   --   --  7.4* 5.3*   < > = values in this interval not displayed.  Liver Function Tests: Recent Labs  Lab 10/07/21 1805 10/07/21 2259  AST 52*  --   ALT 61*  --   ALKPHOS 32*  --   BILITOT 0.9  --   PROT 5.5*  --   ALBUMIN 2.6* 2.4*    No results for input(s): LIPASE, AMYLASE in the last 168 hours. Recent Labs  Lab 10/07/21 1805  AMMONIA 13    CBC: Recent Labs  Lab 10/07/21 1805 10/07/21 1812 10/07/21 2115 10/08/21 0240 10/08/21 0450 10/08/21 0541 10/09/21 0436  WBC 10.2  --  10.0 7.1  --  5.3 8.1  NEUTROABS 9.6*  --   --   --   --   --  7.3  HGB 8.4*   < > 7.2* 7.6* 6.8* 7.3* 7.4*  HCT 27.0*   < > 23.3* 23.3* 20.0* 22.4* 22.3*  MCV 94.1  --  92.5 87.3  --  89.2 88.8  PLT 110*  --  95* 108*  --  99* 83*   < > = values in this interval not displayed.    Cardiac Enzymes: Recent Labs  Lab 10/07/21 1805 10/08/21 0240   CKTOTAL 1,839* 1,707*    CBG: Recent Labs  Lab 10/08/21 1534 10/08/21 1930 10/09/21 0002 10/09/21 0325 10/09/21 0714  GLUCAP 111* 120* 87 120* 124*     Iron Studies: No results for input(s): IRON, TIBC, TRANSFERRIN, FERRITIN in the last 72 hours. Studies/Results: CT HEAD WO CONTRAST  Result Date: 10/07/2021 CLINICAL DATA:  Neurologic deficit. EXAM: CT HEAD WITHOUT CONTRAST TECHNIQUE: Contiguous axial images were obtained from the base of the skull through the vertex without intravenous contrast. COMPARISON:  None. FINDINGS: Brain: Moderate age-related atrophy and chronic microvascular ischemic changes. 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 There is however a smaller high attenuating area within these collection over the right parietal lobe (22/4) concerning for an acute or subacute subdural hemorrhage. This measures approximately 4 mm in thickness. There is associated mass effect and 6 mm right to left midline shift. Vascular: No hyperdense vessel or unexpected calcification. Skull: Normal. Negative for fracture or focal lesion. Sinuses/Orbits: The visualized paranasal sinuses and mastoid air cells are clear. Other: None IMPRESSION: 1. 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 Mass effect on the right brain with approximately 6 mm right to left midline shift. 2. Moderate age-related atrophy and chronic microvascular ischemic changes. These results were called by telephone at the time of interpretation on 10/07/2021 at 8:46 pm to Dr. Melina Copa, who verbally acknowledged these results. Electronically Signed   By: Anner Crete M.D.   On: 10/07/2021 20:52   CT CERVICAL SPINE WO CONTRAST  Result Date: 10/08/2021 CLINICAL DATA:  Fall EXAM: CT CERVICAL SPINE WITHOUT CONTRAST TECHNIQUE: Multidetector CT imaging of the cervical spine  was performed without intravenous contrast. Multiplanar CT image reconstructions were also generated. COMPARISON:  None. FINDINGS: Alignment: No static subluxation. Facets are aligned. Occipital condyles and the lateral masses of C1 and C2 are normally approximated. Skull base and vertebrae: No acute fracture. Soft tissues and spinal canal: No prevertebral fluid or swelling. No visible canal hematoma. Disc levels: No advanced spinal canal or neural foraminal stenosis. Upper chest: No pneumothorax, pulmonary nodule or pleural effusion. Other: Normal visualized paraspinal cervical soft tissues. IMPRESSION: No acute fracture or static subluxation of the cervical spine. Electronically Signed   By: Cletus Gash.D.  On: 10/08/2021 03:53   US RENAL  Result Date: 10/07/2021 CLINICAL DATA:  Initial evaluation for acute kidney injury. EXAM: RENAL / URINARY TRACT ULTRASOUND COMPLETE COMPARISON:  Prior CT from 11/05/2018. FINDINGS: Right Kidney: Renal measurements: 8.6 x 3.4 x 5.1 cm = volume: 76.2 mL. Right kidney is somewhat small with markedly increased echogenicity within the renal parenchyma. Poor corticomedullary differentiation. No visible nephrolithiasis. Minimal pelviectasis without hydronephrosis. No focal renal mass. Left Kidney: Renal measurements: 8.6 x 4.9 x 4.1 cm = volume: 89.7 mL. Left kidney is somewhat small with increased echogenicity within the renal parenchyma. Poor corticomedullary differentiation. No nephrolithiasis or hydronephrosis. 1.3 x 0.8 x 0.9 cm simple cyst present at the lower pole. Additional 1.4 x 1.3 x 1.2 cm minimally complex but benign appearing cyst at the upper pole. Additional small 1.1 cm simple cyst at the upper pole. Bladder: Decompressed with a Foley catheter in place. Other: None. IMPRESSION: 1. Increased echogenicity within the renal parenchyma, consistent with medical renal disease. 2. No hydronephrosis. 3. Few benign-appearing left renal cysts measuring up to 1.4 cm as  above. 4. Foley catheter in place with decompression of the bladder. Electronically Signed   By: Jeannine Boga M.D.   On: 10/07/2021 22:55   DG Chest Portable 1 View  Result Date: 10/07/2021 CLINICAL DATA:  Intubated EXAM: PORTABLE CHEST 1 VIEW COMPARISON:  None. FINDINGS: Endotracheal tube tip is about 3.8 cm superior to carina. No esophageal visible on the included portions of the chest. Lung fields are clear. Normal cardiomediastinal silhouette with aortic atherosclerosis. No pneumothorax IMPRESSION: 1. Endotracheal tube tip about 3.8 cm superior to carina 2. No visible nasogastric tube on the included portions of the chest Electronically Signed   By: Donavan Foil M.D.   On: 10/07/2021 18:45   DG Abd Portable 1V  Result Date: 10/08/2021 CLINICAL DATA:  Orogastric tube placement. EXAM: PORTABLE ABDOMEN - 1 VIEW COMPARISON:  None. FINDINGS: Orogastric tube is seen with the side port at the GE junction in the distal tip in the proximal stomach. Several mildly dilated small bowel loops are seen in the left abdomen, with a small amount of gas in nondilated colon. This may be due to mild ileus or partial small bowel obstruction. IMPRESSION: Orogastric tube side-port at the GE junction, and distal tip in the proximal stomach. Mild ileus versus partial small bowel obstruction. Electronically Signed   By: Marlaine Hind M.D.   On: 10/08/2021 14:49   ECHOCARDIOGRAM COMPLETE  Result Date: 10/08/2021    ECHOCARDIOGRAM REPORT   Patient Name:   Ryan Cortez Date of Exam: 10/08/2021 Medical Rec #:  [id number]    Height:       68.0 in Accession #:    [id number]   Weight:       111.6 lb Date of Birth:  1927/03/20    BSA:          1.595 m Patient Age:    37 years     BP:           88/55 mmHg Patient Gender: M            HR:           95 bpm. Exam Location:  Inpatient Procedure: 2D Echo, Cardiac Doppler and Color Doppler Indications:    Sepsis  History:        Patient has no prior history of Echocardiogram  examinations.  Arrythmias:Tachycardia; Signs/Symptoms:Hypotension. Respiratory                 failure.  Sonographer:    Merrie Roof RDCS Referring Phys: [id number] [place]  1. There is a mass in the RA (1.2 cm x 0.8 cm) that is best seen on subcotal imaging. This could represent a very prominent eustachian valve, but cannot exclude vegetation/thrombus. The patient does not have a central line. It does not appear to be attached to the tricuspid valve. If there are clinical concerns for endocarditis would consider a TEE for better characterization.  2. Left ventricular ejection fraction, by estimation, is 60 to 65%. The left ventricle has normal function. The left ventricle has no regional wall motion abnormalities. Left ventricular diastolic parameters are indeterminate.  3. Right ventricular systolic function is normal. The right ventricular size is normal.  4. The mitral valve is grossly normal. Trivial mitral valve regurgitation. No evidence of mitral stenosis.  5. The aortic valve is tricuspid. There is moderate calcification of the aortic valve. There is moderate thickening of the aortic valve. Aortic valve regurgitation is not visualized. Aortic valve sclerosis/calcification is present, without any evidence of aortic stenosis. FINDINGS  Left Ventricle: Left ventricular ejection fraction, by estimation, is 60 to 65%. The left ventricle has normal function. The left ventricle has no regional wall motion abnormalities. The left ventricular internal cavity size was small. There is no left ventricular hypertrophy. Left ventricular diastolic parameters are indeterminate. Right Ventricle: The right ventricular size is normal. No increase in right ventricular wall thickness. Right ventricular systolic function is normal. Left Atrium: Left atrial size was normal in size. Right Atrium: There is a mass in the RA (1.2 cm x 0.8 cm) that is best seen on subcotal imaging. This could represent  a very prominent eustachian valve, but cannot exclude vegetation/thrombus. The patient does not have a central line. It does not appear to be attached to the tricuspid valve. If there are clinical concerns for endocarditis would consider a TEE for better characterization. Right atrial size was normal in size. Pericardium: Trivial pericardial effusion is present. Mitral Valve: The mitral valve is grossly normal. There is moderate thickening of the anterior and posterior mitral valve leaflet(s). Trivial mitral valve regurgitation. No evidence of mitral valve stenosis. Tricuspid Valve: The tricuspid valve is grossly normal. Tricuspid valve regurgitation is mild . No evidence of tricuspid stenosis. Aortic Valve: The aortic valve is tricuspid. There is moderate calcification of the aortic valve. There is moderate thickening of the aortic valve. Aortic valve regurgitation is not visualized. Aortic valve sclerosis/calcification is present, without any  evidence of aortic stenosis. Pulmonic Valve: The pulmonic valve was grossly normal. Pulmonic valve regurgitation is not visualized. No evidence of pulmonic stenosis. Aorta: The aortic root is normal in size and structure. Venous: The right lower pulmonary vein is normal. IVC assessment for right atrial pressure unable to be performed due to mechanical ventilation. IAS/Shunts: The atrial septum is grossly normal.  LEFT VENTRICLE PLAX 2D LVIDd:         2.90 cm   Diastology LVIDs:         2.10 cm   LV e' medial:    6.42 cm/s LV PW:         1.30 cm   LV E/e' medial:  11.5 LV IVS:        0.80 cm   LV e' lateral:   9.36 cm/s LVOT diam:     1.80 cm  LV E/e' lateral: 7.9 LV SV:         39 LV SV Index:   24 LVOT Area:     2.54 cm  IVC IVC diam: 2.00 cm LEFT ATRIUM           Index        RIGHT ATRIUM           Index LA diam:      2.70 cm 1.69 cm/m   RA Area:     16.20 cm LA Vol (A4C): 50.8 ml 31.86 ml/m  RA Volume:   52.00 ml  32.61 ml/m  AORTIC VALVE LVOT Vmax:   99.20 cm/s  LVOT Vmean:  60.500 cm/s LVOT VTI:    0.153 m  AORTA Ao Root diam: 2.60 cm MITRAL VALVE               TRICUSPID VALVE MV Area (PHT): 2.99 cm    TR Peak grad:   37.2 mmHg MV Decel Time: 254 msec    TR Vmax:        305.00 cm/s MV E velocity: 73.90 cm/s MV A velocity: 99.30 cm/s  SHUNTS MV E/A ratio:  0.74        Systemic VTI:  0.15 m                            Systemic Diam: 1.80 cm Eleonore Chiquito MD Electronically signed by Eleonore Chiquito MD Signature Date/Time: 10/08/2021/6:14:32 PM    Final     Medications: Infusions:  sodium chloride Stopped (10/07/21 2059)   feeding supplement (VITAL 1.5 CAL) 45 mL/hr at 10/09/21 0800    Scheduled Medications:  chlorhexidine gluconate (MEDLINE KIT)  15 mL Mouth Rinse BID   Chlorhexidine Gluconate Cloth  6 each Topical Daily   feeding supplement (PROSource TF)  45 mL Per Tube Daily   free water  400 mL Per Tube Q6H   insulin aspart  0-6 Units Subcutaneous Q4H   mouth rinse  15 mL Mouth Rinse 10 times per day   pantoprazole (PROTONIX) IV  40 mg Intravenous Q12H    have reviewed scheduled and prn medications.  Physical Exam: General: Critically ill looking male intubated, not responding.  Not much change from yesterday Heart:RRR, s1s2 nl Lungs: Coarse breath sound, Abdomen:soft,  non-distended Extremities:No edema Neurology: Not responsive.   Deklynn Charlet Prasad Jalal Rauch 10/09/2021,9:26 AM  LOS: 2 days

## 2021-10-09 NOTE — Progress Notes (Signed)
NAME:  Ryan Cortez, MRN:  [id number], DOB:  1927-06-18, LOS: 2 ADMISSION DATE:  10/07/2021, CONSULTATION DATE:  10/09/21 REFERRING MD:  EDP, CHIEF COMPLAINT:  AMS  History of Present Illness:  Ryan Cortez is a 87 y/o M with PMH significant for HTN, HL, CKD, DM who was found down in his home minimally responsive and covered in dark stool.  Daughter and patient's friend are at the bedside and state they last saw him on 11/15 and that someone working at his apartment complex must have called EMS.  He is very independent at baseline and still drives, though refuses to use his cane.  Pt was intubated on arrival. Work-up significant for renal insufficiency with creatinine of 9.3 and K of 7.2, Na 150 and CK 1800.  CXR without significant infiltrate.  He was given IVF, broad spectrum abx and temporizing measures for hyperkalemia and PCCM consulted. 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 but with  10mm area acute or subacute subdural with 16mm midline shift  Pertinent  Medical History         Past Medical History:  Diagnosis Date   Anemia      iron Rxed by Dr Ardis Hughs   Arthritis     BPH (benign prostatic hyperplasia)     Diabetes ([place])     Fasting hyperglycemia     Hyperlipidemia     Hypertension     Other abnormal glucose 2008    A1c 6.1%   Positive PPD 1991    no INH prophylaxis due to age   PVD (peripheral vascular disease) ([place])     Pyloric stenosis     Renal insufficiency      Dr Justin Mend     [facility] Events: Including procedures, antibiotic start and stop dates in addition to other pertinent events   11/28 Found down, intubated and given Cefepime/Vanc/Flagyl 11/29 UOP present 11/30 UOP ok, Cr trending down, BUN trending down still > 100, BP soft    Lines/Tubes 11/28 ETT   Significant Imaging 11/28 CT head>Probable small acute or subacute right parietal subdural hemorrhage on a moderate-sized right hemispheric 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 Mass effect  on the right brain with approximately 6 mm right to left midline shift.  Interim History / Subjective:  Following commands, Cr trending down, UOP ok, K stable  Objective   Blood pressure (!) 102/56, pulse 66, temperature 97.7 F (36.5 C), temperature source Bladder, resp. rate 18, height 5\' 8"  (1.727 m), weight 54.6 kg, SpO2 100 %.    Vent Mode: PRVC FiO2 (%):  [40 %] 40 % Set Rate:  [18 bmp] 18 bmp Vt Set:  [500 mL] 500 mL PEEP:  [5 cmH20] 5 cmH20 Pressure Support:  [5 cmH20-10 cmH20] 10 cmH20 Plateau Pressure:  [9 cmH20-17 cmH20] 16 cmH20   Intake/Output Summary (Last 24 hours) at 10/09/2021 0810 Last data filed at 10/09/2021 0600 Gross per 24 hour  Intake 2880.15 ml  Output 1345 ml  Net 1535.15 ml    Filed Weights   10/07/21 2229 10/08/21 0500 10/09/21 0500  Weight: 50.6 kg 50.6 kg 54.6 kg   General:  poorly nourished elderly M, intubated but responsive HEENT: MM pink/dry, clera anicteric Neuro:  intubated on no sedation, follows commands CV: s1s2 rrr, no m/r/g PULM:  mechanically ventilated with decreased air movement in bilateral bases  GI: soft, bsx4 active  Extremities: cool/dry, extremely dry skin without  edema  Skin: no rashes or lesions   Resolved Hospital Problem  list     Assessment & Plan:    Acute Encephalopathy and Hypoxic Respiratory Failure Acute vs. Subacute R parietal subdural Likely metabolic and secondary to acute/subacute subdural CT with old hygroma and 32mm acute vs subacute subdural Not on anti-coagulation UDS and ETOH unremarkable -monitor neuro status, not on sedation currently, exam seem improved  -Neurosurgery consult - no indication for surgery, appreciate assistance -Maintain full vent support with SAT/SBT as tolerated -titrate Vent setting to maintain SpO2 greater than or equal to 90%. -HOB elevated 30 degrees.  Acute renal failure on CKD4 with hyperkalemia Possible Rhabdomyolysis AGMA due to renal  failure Hypernatremia Creatinine >9 on admission with last baseline in Epic ~3 last year -appreciate nephrology consult, UOP ok, Cr trending down -no urgent indication for dialysis -renal US without hydronephrosis -monitor I&O -avoid nephrotoxins and renally dose meds -trend CK - Free water via OG, TFs  Hypotension and Hypothermia Improving with IVF and bair hugger -Sepsis ruled out -abx stopped AM 11/29  Baseline HTN, HL -holding home Lasix and Norvasc in the setting of the above -obtain echo  Protein Calorie Malnutrition POA -TF  Aerococcus in blood culture: Likely contaminant, received Vanc 11/29 and elevated Cr so likely still circulating.   Best Practice (right click and "Reselect all SmartList Selections" daily)   Diet/type: tubefeeds DVT prophylaxis: SCD GI prophylaxis: PPI Lines: N/A Foley:  Yes, and it is still needed Code Status:  full code Last date of multidisciplinary goals of care discussion [discussed with Pt's daughter on admission and friend. Pt had no advanced directives and they had not discussed life support, want aggressive care and full code at this time]  Labs   CBC: Recent Labs  Lab 10/07/21 1805 10/07/21 1812 10/07/21 2115 10/08/21 0240 10/08/21 0450 10/08/21 0541 10/09/21 0436  WBC 10.2  --  10.0 7.1  --  5.3 8.1  NEUTROABS 9.6*  --   --   --   --   --  7.3  HGB 8.4*   < > 7.2* 7.6* 6.8* 7.3* 7.4*  HCT 27.0*   < > 23.3* 23.3* 20.0* 22.4* 22.3*  MCV 94.1  --  92.5 87.3  --  89.2 88.8  PLT 110*  --  95* 108*  --  99* 83*   < > = values in this interval not displayed.     Basic Metabolic Panel: Recent Labs  Lab 10/07/21 2259 10/08/21 0240 10/08/21 0450 10/08/21 0541 10/08/21 1324 10/08/21 1556 10/09/21 0436  NA 151* 150* 152* 152* 152*  --  146*  K 6.0* 5.6* 5.3* 5.4* 5.0  --  4.4  CL 119* 118*  --  118* 118*  --  113*  CO2 14* 16*  --  17* 20*  --  21*  GLUCOSE 82 85  --  96 115*  --  132*  BUN 210* 201*  --  203* 186*   --  167*  CREATININE 8.43* 8.00*  --  8.13* 7.42*  --  6.32*  CALCIUM 6.7* 7.0*  --  6.7* 6.3*  --  6.5*  MG 2.9* 2.5*  --   --   --  2.7* 2.4  PHOS 8.6* 7.3*  --   --   --  7.4* 5.3*    GFR: Estimated Creatinine Clearance: 5.8 mL/min (A) (by C-G formula based on SCr of 6.32 mg/dL (H)). Recent Labs  Lab 10/07/21 1805 10/07/21 2115 10/08/21 0240 10/08/21 0541 10/09/21 0436  WBC 10.2 10.0 7.1 5.3 8.1  LATICACIDVEN 1.2  --   --   --   --  Liver Function Tests: Recent Labs  Lab 10/07/21 1805 10/07/21 2259  AST 52*  --   ALT 61*  --   ALKPHOS 32*  --   BILITOT 0.9  --   PROT 5.5*  --   ALBUMIN 2.6* 2.4*    No results for input(s): LIPASE, AMYLASE in the last 168 hours. Recent Labs  Lab 10/07/21 1805  AMMONIA 13     ABG    Component Value Date/Time   PHART 7.466 (H) 10/08/2021 0450   PCO2ART 24.2 (L) 10/08/2021 0450   PO2ART 189 (H) 10/08/2021 0450   HCO3 17.6 (L) 10/08/2021 0450   TCO2 18 (L) 10/08/2021 0450   ACIDBASEDEF 6.0 (H) 10/08/2021 0450   O2SAT 100.0 10/08/2021 0450      Coagulation Profile: Recent Labs  Lab 10/07/21 1805  INR 1.3*     Cardiac Enzymes: Recent Labs  Lab 10/07/21 1805 10/08/21 0240  CKTOTAL 1,839* 1,707*     HbA1C: Hgb A1c MFr Bld  Date/Time Value Ref Range Status  10/07/2021 10:59 PM 5.7 (H) 4.8 - 5.6 % Final    Comment:    (NOTE)         Prediabetes: 5.7 - 6.4         Diabetes: >6.4         Glycemic control for adults with diabetes: <7.0     CBG: Recent Labs  Lab 10/08/21 1534 10/08/21 1930 10/09/21 0002 10/09/21 0325 10/09/21 0714  GLUCAP 111* 120* 87 120* 124*     Review of Systems:   Unable to obtain secondary to mental status  Past Medical History:  He,  has no past medical history on file.   Surgical History:     Social History:      Family History:  His family history is not on file.   Allergies No Known Allergies   Home Medications  Prior to Admission medications    Medication Sig Start Date End Date Taking? Authorizing Provider  dorzolamide (TRUSOPT) 2 % ophthalmic solution Place 1 drop into the left eye 2 (two) times daily.   Yes [provider]  prednisoLONE acetate (PRED FORTE) 1 % ophthalmic suspension Place 1 drop into the right eye in the morning and at bedtime.   Yes [provider]  amLODipine (NORVASC) 10 MG tablet Take 10 mg by mouth daily.    [provider]  finasteride (PROSCAR) 5 MG tablet Take 5 mg by mouth daily.    [provider]  furosemide (LASIX) 40 MG tablet Take 40 mg by mouth daily.    [provider]  losartan (COZAAR) 100 MG tablet Take 100 mg by mouth daily.    [provider]  tamsulosin (FLOMAX) 0.4 MG CAPS capsule Take 0.4 mg by mouth daily.    [provider]     Critical care time:     CRITICAL CARE Performed by: Lanier Clam   Total critical care time: 33 minutes  Critical care time was exclusive of separately billable procedures and treating other patients.  Critical care was necessary to treat or prevent imminent or life-threatening deterioration.  Critical care was time spent personally by me on the following activities: development of treatment plan with patient and/or surrogate as well as nursing, discussions with consultants, evaluation of patient's response to treatment, examination of patient, obtaining history from patient or surrogate, ordering and performing treatments and interventions, ordering and review of laboratory studies, ordering and review of radiographic studies, pulse oximetry and  re-evaluation of patient's condition.  Lanier Clam, MD [facility] See Amion for contact info If no response to pager , please call [phone] until 7pm After 7:00 pm call Elink  [phone]?[place]

## 2021-10-10 DIAGNOSIS — N179 Acute kidney failure, unspecified: Secondary | ICD-10-CM | POA: Diagnosis not present

## 2021-10-10 LAB — GLUCOSE, CAPILLARY
Glucose-Capillary: 123 mg/dL — ABNORMAL HIGH (ref 70–99)
Glucose-Capillary: 125 mg/dL — ABNORMAL HIGH (ref 70–99)
Glucose-Capillary: 109 mg/dL — ABNORMAL HIGH (ref 70–99)
Glucose-Capillary: 79 mg/dL (ref 70–99)
Glucose-Capillary: 58 mg/dL — ABNORMAL LOW (ref 70–99)
Glucose-Capillary: 101 mg/dL — ABNORMAL HIGH (ref 70–99)
Glucose-Capillary: 101 mg/dL — ABNORMAL HIGH (ref 70–99)

## 2021-10-10 LAB — BASIC METABOLIC PANEL
Anion gap: 10 (ref 5–15)
BUN: 156 mg/dL — ABNORMAL HIGH (ref 8–23)
CO2: 20 mmol/L — ABNORMAL LOW (ref 22–32)
Calcium: 6.3 mg/dL — CL (ref 8.9–10.3)
Chloride: 113 mmol/L — ABNORMAL HIGH (ref 98–111)
Creatinine, Ser: 5.16 mg/dL — ABNORMAL HIGH (ref 0.61–1.24)
GFR, Estimated: 10 mL/min — ABNORMAL LOW (ref >60)
Glucose, Bld: 129 mg/dL — ABNORMAL HIGH (ref 70–99)
Potassium: 4.6 mmol/L (ref 3.5–5.1)
Sodium: 143 mmol/L (ref 135–145)

## 2021-10-10 LAB — MAGNESIUM: Magnesium: 2.1 mg/dL (ref 1.7–2.4)

## 2021-10-10 LAB — PHOSPHORUS: Phosphorus: 5 mg/dL — ABNORMAL HIGH (ref 2.5–4.6)

## 2021-10-10 MED ORDER — CALCIUM GLUCONATE-NACL 2-0.675 GM/100ML-% IV SOLN
2.0000 g | Freq: Once | INTRAVENOUS | Status: AC
  Administered 2021-10-10: 2000 mg via INTRAVENOUS
  Filled 2021-10-10: qty 100

## 2021-10-10 MED ORDER — DEXTROSE 50 % IV SOLN
12.5000 g | INTRAVENOUS | Status: AC
  Administered 2021-10-10: 12.5 g via INTRAVENOUS
  Filled 2021-10-10: qty 50

## 2021-10-10 MED ORDER — DEXTROSE-NACL 5-0.45 % IV SOLN: INTRAVENOUS | Status: DC

## 2021-10-10 MED ORDER — ORAL CARE MOUTH RINSE
15.0000 mL | Freq: Two times a day (BID) | OROMUCOSAL | Status: DC
  Administered 2021-10-10 – 2021-10-16 (×11): 15 mL via OROMUCOSAL

## 2021-10-10 NOTE — Progress Notes (Addendum)
NAME:  Ryan Cortez, MRN:  [id number], DOB:  1927-07-13, LOS: 3 ADMISSION DATE:  10/07/2021, CONSULTATION DATE:  10/10/21 REFERRING MD:  EDP, CHIEF COMPLAINT:  AMS  History of Present Illness:  Ryan Cortez is a 87 y/o M with PMH significant for HTN, HL, CKD, DM who was found down in his home minimally responsive and covered in dark stool.  Daughter and patient's friend are at the bedside and state they last saw him on 11/15 and that someone working at his apartment complex must have called EMS.  He is very independent at baseline and still drives, though refuses to use his cane.  Pt was intubated on arrival. Work-up significant for renal insufficiency with creatinine of 9.3 and K of 7.2, Na 150 and CK 1800.  CXR without significant infiltrate.  He was given IVF, broad spectrum abx and temporizing measures for hyperkalemia and PCCM consulted. 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 but with  61mm area acute or subacute subdural with 33mm midline shift  Pertinent  Medical History  Anemia Arthritis hypertension Hyperlipidemia PVD CKD stage IV  Significant Hospital Events: Including procedures, antibiotic start and stop dates in addition to other pertinent events   11/28 Found down, intubated and given Cefepime/Vanc/Flagyl 11/29 UOP present 11/30 UOP ok, Cr trending down, BUN trending down still > 100, BP soft 12/1 No acute issues overnight, alert and following commands on vent    Lines/Tubes 11/28 ETT 11/28 Foley   Significant Imaging 11/28 CT head>Probable small acute or subacute right parietal subdural hemorrhage on a moderate-sized right hemispheric 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 Mass effect on the right brain with approximately 6 mm right to left midline shift. 11/28 renal ultrasound evidence of medical renal disease, few left benign-appearing renal cyst  Interim History / Subjective:  Continues to make urine, 1.2 L out in the last 24 hours  Objective   Blood pressure  (!) 115/40, pulse (!) 56, temperature (!) 97.5 F (36.4 C), temperature source Bladder, resp. rate 15, height 5\' 8"  (1.727 m), weight 57.3 kg, SpO2 100 %.    Vent Mode: PRVC FiO2 (%):  [40 %] 40 % Set Rate:  [18 bmp] 18 bmp Vt Set:  [500 mL] 500 mL PEEP:  [5 cmH20] 5 cmH20 Pressure Support:  [5 cmH20-10 cmH20] 5 cmH20 Plateau Pressure:  [15 cmH20-23 cmH20] 15 cmH20   Intake/Output Summary (Last 24 hours) at 10/10/2021 0729 Last data filed at 10/10/2021 0400 Gross per 24 hour  Intake 3099.42 ml  Output 1207 ml  Net 1892.42 ml    Filed Weights   10/08/21 0500 10/09/21 0500 10/10/21 0405  Weight: 50.6 kg 54.6 kg 57.3 kg   Physical exam General: Acute on chronically ill appearing thin deconditioned elderly male lying in bed on mechanical ventilation in no acute distress HEENT: [place]/AT, MM pink/moist, PERRL,  Neuro: Alert and following commands on ventilator CV: s1s2 regular rate and rhythm, no murmur, rubs, or gallops,  PULM: Rotation bilaterally, no increased work of breathing, tolerating ventilator GI: soft, bowel sounds active in all 4 quadrants, non-tender, non-distended, tolerating TF Extremities: warm/dry, bilateral upper extremity 2+ pitting edema Skin: no rashes or lesions  Resolved Hospital Problem list   Anion gap metabolic acidosis  Assessment & Plan:   Acute Encephalopathy and Hypoxic Respiratory Failure Acute vs. 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 and 11mm acute vs subacute subdural -Not on anti-coagulation, neurosurgery consulted on admission with no indications for surgical intervention -UDS and ETOH unremarkable  P: Alert on ventilator, following commands appropriately Maintain neuro protective measures; goal for eurothermia, euglycemia, eunatermia, normoxia, and PCO2 goal of 35-40 Nutrition and bowel regiment  Seizure precautions  Aspirations precautions    Acute Hypoxic Respiratory Failure -Likely metabolic and secondary to acute/subacute  subdural P: Continue ventilator support with lung protective strategies  Currently tolerating SBT well can likely extubate later today Wean PEEP and FiO2 for sats greater than 90%. Head of bed elevated 30 degrees. Plateau pressures less than 30 cm H20.  Follow intermittent chest x-ray and ABG.   Ensure adequate pulmonary hygiene  Follow cultures  VAP bundle in place  PAD protocol  Acute renal failure on CKD4 with hyperkalemia Possible Rhabdomyolysis Hypernatremia -Creatinine >9 on admission with last baseline in Epic ~3 last year -Per nephrology patient is poor candidate for dialysis P: Nephrology following, appreciate assistance Follow renal function  Monitor urine output Trend Bmet Avoid nephrotoxins Ensure adequate renal perfusion  Free water  Hypotension and Hypothermia -Improving with IVF and bair hugger -Sepsis ruled out antibiotic stopped 11/29  P: Continue Bair hugger Supportive care  Baseline HTN, HL -Home medications include Norvasc, Lasix, and Cozaar 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   Best Practice (right click and "Reselect all SmartList Selections" daily)   Diet/type: tubefeeds DVT prophylaxis: SCD GI prophylaxis: PPI Lines: N/A Foley:  Yes, and it is still needed Code Status:  full code Last date of multidisciplinary goals of care discussion [discussed with Pt's daughter on admission and friend. Pt had no advanced directives and they had not discussed life support, want aggressive care and full code at this time]   Critical care time:      CRITICAL CARE Performed by: Cartha Rotert D. Harris  Total critical care time: 40 minutes  Critical care time was exclusive of separately billable procedures and treating other patients.  Critical care was necessary to treat or prevent imminent or life-threatening deterioration.  Critical care was time spent personally by me on the following activities: development of treatment plan with patient and/or surrogate as well as nursing, discussions with consultants, evaluation of patient's response to treatment, examination of patient, obtaining history from patient or surrogate, ordering and performing treatments and interventions, ordering and review of laboratory studies, ordering and review of radiographic studies, pulse oximetry and re-evaluation of patient's condition.  Yosiel Thieme D. Kenton Kingfisher, NP [facility] See Amion for contact info If no response to pager , please call [phone] until 7pm After 7:00 pm call Elink  [phone]?[place]

## 2021-10-10 NOTE — Procedures (Signed)
Extubation Procedure Note  Patient Details:   Name: Ryan Cortez DOB: Mar 10, 1927 MRN: [id number]   Airway Documentation:    Vent end date: 10/10/21 Vent end time: 1501   Evaluation  O2 sats: stable throughout Complications: No apparent complications Patient did tolerate procedure well. Bilateral Breath Sounds: Rhonchi, Diminished   Yes  Pt extubated to 3L , Pt tolerating well. No stridor noted, Cuff leak present, RN at bedside, RT will continue to monitor.   Geni Bers Keylin Podolsky 10/10/2021, 3:04 PM

## 2021-10-10 NOTE — Progress Notes (Signed)
[facility] NEPHROLOGY PROGRESS NOTE  Assessment/ Plan: Pt is a 87 y.o. yo male  PMH significant for DM type 2, HTN, BPH, HLD, pyloric stenosis, h/o + PPD, and CKD stage IV who was found down in his home, unresponsive, seen as a consultation for AKI on CKD and hyperkalemia.  Seen by Dr. Justin Mend in 10/2020 when creatinine level was 3.1.  #Acute kidney injury on CKD stage IV, nonoliguric: Multifactorial etiology including ischemic ATN due to severe dehydration, BOO versus progression of underlying CKD. Urinalysis with few RBC and protein creatinine ratio 0.3.  SPEP and kappa lambda ratio pending.  Kidney ultrasound with chronic findings and cyst.  Foley catheter was inserted and treated with IV fluid.  The creatinine level is trending down and he has urine output of around 1.2 L.  Serum sodium level is improved and he is positive by more than 5.5 L therefore I will discontinue free water-continue gentle half NS for insensible loss. Given his advanced ages, comorbidities including subdural hematoma, I think he will do poorly with dialysis therefore I will not recommended it.  Fortunately, he is having renal recovery and no need for dialysis at this time.  I recommend palliative care consult to discuss goals of care.  #Hypernatremia, hypovolemic: Improved with free water, discontinue today.  Monitor lab.  #Hyperkalemia: Improved with medical management.  #Anion gap metabolic acidosis: Improving, off of IV sodium bicarbonate.  Monitor serum CO2 level.  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  Currently in ICU, intubated.  Seen by neurosurgeon, not operative candidate.  Monitor neuro status.  #Hypocalcemia: Continue to replete IV calcium.  Monitor lab.  #Ventilator dependent respiratory failure: Currently on ventilation.  Managed by PCCM.  #Hypotension improved with IV fluid.  Discussed with ICU team.  Subjective: Seen and  examined ICU.  Urine output around 1.2 L.  Remains intubated.  He is alert and opens eyes with the name.  No new event.  Objective Vital signs in last 24 hours: Vitals:   10/10/21 0405 10/10/21 0700 10/10/21 0741 10/10/21 0804  BP:  106/82    Pulse:  73 73   Resp:  19 14   Temp:  (!) 97.2 F (36.2 C)  (!) 97 F (36.1 C)  TempSrc:      SpO2:  100% 100%   Weight: 57.3 kg     Height:       Weight change: 2.7 kg  Intake/Output Summary (Last 24 hours) at 10/10/2021 0955 Last data filed at 10/10/2021 0800 Gross per 24 hour  Intake 3354.38 ml  Output 1232 ml  Net 2122.38 ml        Labs: Basic Metabolic Panel: Recent Labs  Lab 10/08/21 1324 10/08/21 1556 10/09/21 0436 10/09/21 1631 10/10/21 0302  NA 152*  --  146*  --  143  K 5.0  --  4.4  --  4.6  CL 118*  --  113*  --  113*  CO2 20*  --  21*  --  20*  GLUCOSE 115*  --  132*  --  129*  BUN 186*  --  167*  --  156*  CREATININE 7.42*  --  6.32*  --  5.16*  CALCIUM 6.3*  --  6.5*  --  6.3*  PHOS  --    < > 5.3* 5.2* 5.0*   < > = values in this interval not displayed.    Liver Function Tests: Recent Labs  Lab 10/07/21 1805 10/07/21 2259  AST 52*  --   ALT 61*  --   ALKPHOS 32*  --   BILITOT 0.9  --   PROT 5.5*  --   ALBUMIN 2.6* 2.4*    No results for input(s): LIPASE, AMYLASE in the last 168 hours. Recent Labs  Lab 10/07/21 1805  AMMONIA 13    CBC: Recent Labs  Lab 10/07/21 1805 10/07/21 1812 10/07/21 2115 10/08/21 0240 10/08/21 0450 10/08/21 0541 10/09/21 0436  WBC 10.2  --  10.0 7.1  --  5.3 8.1  NEUTROABS 9.6*  --   --   --   --   --  7.3  HGB 8.4*   < > 7.2* 7.6* 6.8* 7.3* 7.4*  HCT 27.0*   < > 23.3* 23.3* 20.0* 22.4* 22.3*  MCV 94.1  --  92.5 87.3  --  89.2 88.8  PLT 110*  --  95* 108*  --  99* 83*   < > = values in this interval not displayed.    Cardiac Enzymes: Recent Labs  Lab 10/07/21 1805 10/08/21 0240  CKTOTAL 1,839* 1,707*    CBG: Recent Labs  Lab 10/09/21 1604  10/09/21 1922 10/09/21 2355 10/10/21 0355 10/10/21 0801  GLUCAP 118* 104* 96 123* 125*     Iron Studies: No results for input(s): IRON, TIBC, TRANSFERRIN, FERRITIN in the last 72 hours. Studies/Results: DG Abd Portable 1V  Result Date: 10/08/2021 CLINICAL DATA:  Orogastric tube placement. EXAM: PORTABLE ABDOMEN - 1 VIEW COMPARISON:  None. FINDINGS: Orogastric tube is seen with the side port at the GE junction in the distal tip in the proximal stomach. Several mildly dilated small bowel loops are seen in the left abdomen, with a small amount of gas in nondilated colon. This may be due to mild ileus or partial small bowel obstruction. IMPRESSION: Orogastric tube side-port at the GE junction, and distal tip in the proximal stomach. Mild ileus versus partial small bowel obstruction. Electronically Signed   By: Marlaine Hind M.D.   On: 10/08/2021 14:49   ECHOCARDIOGRAM COMPLETE  Result Date: 10/08/2021    ECHOCARDIOGRAM REPORT   Patient Name:   Ryan Cortez Date of Exam: 10/08/2021 Medical Rec #:  [id number]    Height:       68.0 in Accession #:    [id number]   Weight:       111.6 lb Date of Birth:  03/20/1927    BSA:          1.595 m Patient Age:    33 years     BP:           88/55 mmHg Patient Gender: M            HR:           95 bpm. Exam Location:  Inpatient Procedure: 2D Echo, Cardiac Doppler and Color Doppler Indications:    Sepsis  History:        Patient has no prior history of Echocardiogram examinations.                 Arrythmias:Tachycardia; Signs/Symptoms:Hypotension. Respiratory                 failure.  Sonographer:    Merrie Roof RDCS Referring Phys: [id number] Stark  1. There is a mass in the RA (1.2 cm x 0.8 cm) that is best seen on subcotal imaging. This could represent a very prominent eustachian valve, but cannot exclude  vegetation/thrombus. The patient does not have a central line. It does not appear to be attached to the tricuspid valve. If there are clinical  concerns for endocarditis would consider a TEE for better characterization.  2. Left ventricular ejection fraction, by estimation, is 60 to 65%. The left ventricle has normal function. The left ventricle has no regional wall motion abnormalities. Left ventricular diastolic parameters are indeterminate.  3. Right ventricular systolic function is normal. The right ventricular size is normal.  4. The mitral valve is grossly normal. Trivial mitral valve regurgitation. No evidence of mitral stenosis.  5. The aortic valve is tricuspid. There is moderate calcification of the aortic valve. There is moderate thickening of the aortic valve. Aortic valve regurgitation is not visualized. Aortic valve sclerosis/calcification is present, without any evidence of aortic stenosis. FINDINGS  Left Ventricle: Left ventricular ejection fraction, by estimation, is 60 to 65%. The left ventricle has normal function. The left ventricle has no regional wall motion abnormalities. The left ventricular internal cavity size was small. There is no left ventricular hypertrophy. Left ventricular diastolic parameters are indeterminate. Right Ventricle: The right ventricular size is normal. No increase in right ventricular wall thickness. Right ventricular systolic function is normal. Left Atrium: Left atrial size was normal in size. Right Atrium: There is a mass in the RA (1.2 cm x 0.8 cm) that is best seen on subcotal imaging. This could represent a very prominent eustachian valve, but cannot exclude vegetation/thrombus. The patient does not have a central line. It does not appear to be attached to the tricuspid valve. If there are clinical concerns for endocarditis would consider a TEE for better characterization. Right atrial size was normal in size. Pericardium: Trivial pericardial effusion is present. Mitral Valve: The mitral valve is grossly normal. There is moderate thickening of the anterior and posterior mitral valve leaflet(s). Trivial  mitral valve regurgitation. No evidence of mitral valve stenosis. Tricuspid Valve: The tricuspid valve is grossly normal. Tricuspid valve regurgitation is mild . No evidence of tricuspid stenosis. Aortic Valve: The aortic valve is tricuspid. There is moderate calcification of the aortic valve. There is moderate thickening of the aortic valve. Aortic valve regurgitation is not visualized. Aortic valve sclerosis/calcification is present, without any  evidence of aortic stenosis. Pulmonic Valve: The pulmonic valve was grossly normal. Pulmonic valve regurgitation is not visualized. No evidence of pulmonic stenosis. Aorta: The aortic root is normal in size and structure. Venous: The right lower pulmonary vein is normal. IVC assessment for right atrial pressure unable to be performed due to mechanical ventilation. IAS/Shunts: The atrial septum is grossly normal.  LEFT VENTRICLE PLAX 2D LVIDd:         2.90 cm   Diastology LVIDs:         2.10 cm   LV e' medial:    6.42 cm/s LV PW:         1.30 cm   LV E/e' medial:  11.5 LV IVS:        0.80 cm   LV e' lateral:   9.36 cm/s LVOT diam:     1.80 cm   LV E/e' lateral: 7.9 LV SV:         39 LV SV Index:   24 LVOT Area:     2.54 cm  IVC IVC diam: 2.00 cm LEFT ATRIUM           Index        RIGHT ATRIUM  Index LA diam:      2.70 cm 1.69 cm/m   RA Area:     16.20 cm LA Vol (A4C): 50.8 ml 31.86 ml/m  RA Volume:   52.00 ml  32.61 ml/m  AORTIC VALVE LVOT Vmax:   99.20 cm/s LVOT Vmean:  60.500 cm/s LVOT VTI:    0.153 m  AORTA Ao Root diam: 2.60 cm MITRAL VALVE               TRICUSPID VALVE MV Area (PHT): 2.99 cm    TR Peak grad:   37.2 mmHg MV Decel Time: 254 msec    TR Vmax:        305.00 cm/s MV E velocity: 73.90 cm/s MV A velocity: 99.30 cm/s  SHUNTS MV E/A ratio:  0.74        Systemic VTI:  0.15 m                            Systemic Diam: 1.80 cm Eleonore Chiquito MD Electronically signed by Eleonore Chiquito MD Signature Date/Time: 10/08/2021/6:14:32 PM    Final      Medications: Infusions:  sodium chloride 75 mL/hr at 10/10/21 0700   sodium chloride Stopped (10/07/21 2059)   calcium gluconate     feeding supplement (VITAL 1.5 CAL) 45 mL/hr at 10/09/21 0800    Scheduled Medications:  chlorhexidine gluconate (MEDLINE KIT)  15 mL Mouth Rinse BID   Chlorhexidine Gluconate Cloth  6 each Topical Daily   feeding supplement (PROSource TF)  45 mL Per Tube Daily   fentaNYL       insulin aspart  0-6 Units Subcutaneous Q4H   mouth rinse  15 mL Mouth Rinse 10 times per day   pantoprazole (PROTONIX) IV  40 mg Intravenous Q12H    have reviewed scheduled and prn medications.  Physical Exam: General: Intubated, alert and opens eyes with the name. Heart:RRR, s1s2 nl Lungs: Coarse breath sound, Abdomen:soft,  non-distended Extremities:No leg edema Neurology: Opening eyes. Ryan Cortez 10/10/2021,9:55 AM  LOS: 3 days

## 2021-10-11 DIAGNOSIS — N179 Acute kidney failure, unspecified: Secondary | ICD-10-CM | POA: Diagnosis not present

## 2021-10-11 DIAGNOSIS — E43 Unspecified severe protein-calorie malnutrition: Secondary | ICD-10-CM | POA: Insufficient documentation

## 2021-10-11 LAB — COMPREHENSIVE METABOLIC PANEL
ALT: 42 U/L (ref 0–44)
AST: 38 U/L (ref 15–41)
Albumin: 1.8 g/dL — ABNORMAL LOW (ref 3.5–5.0)
Alkaline Phosphatase: 27 U/L — ABNORMAL LOW (ref 38–126)
Anion gap: 9 (ref 5–15)
BUN: 139 mg/dL — ABNORMAL HIGH (ref 8–23)
CO2: 20 mmol/L — ABNORMAL LOW (ref 22–32)
Calcium: 6.8 mg/dL — ABNORMAL LOW (ref 8.9–10.3)
Chloride: 113 mmol/L — ABNORMAL HIGH (ref 98–111)
Creatinine, Ser: 4.45 mg/dL — ABNORMAL HIGH (ref 0.61–1.24)
GFR, Estimated: 12 mL/min — ABNORMAL LOW (ref >60)
Glucose, Bld: 114 mg/dL — ABNORMAL HIGH (ref 70–99)
Potassium: 4.4 mmol/L (ref 3.5–5.1)
Sodium: 142 mmol/L (ref 135–145)
Total Bilirubin: 0.5 mg/dL (ref 0.3–1.2)
Total Protein: 4.5 g/dL — ABNORMAL LOW (ref 6.5–8.1)

## 2021-10-11 LAB — GLUCOSE, CAPILLARY
Glucose-Capillary: 107 mg/dL — ABNORMAL HIGH (ref 70–99)
Glucose-Capillary: 102 mg/dL — ABNORMAL HIGH (ref 70–99)
Glucose-Capillary: 94 mg/dL (ref 70–99)
Glucose-Capillary: 143 mg/dL — ABNORMAL HIGH (ref 70–99)
Glucose-Capillary: 134 mg/dL — ABNORMAL HIGH (ref 70–99)
Glucose-Capillary: 88 mg/dL (ref 70–99)

## 2021-10-11 LAB — CBC
HCT: 23.6 % — ABNORMAL LOW (ref 39.0–52.0)
Hemoglobin: 7.5 g/dL — ABNORMAL LOW (ref 13.0–17.0)
MCH: 29.1 pg (ref 26.0–34.0)
MCHC: 31.8 g/dL (ref 30.0–36.0)
MCV: 91.5 fL (ref 80.0–100.0)
Platelets: 90 10*3/uL — ABNORMAL LOW (ref 150–400)
RBC: 2.58 MIL/uL — ABNORMAL LOW (ref 4.22–5.81)
RDW: 14.6 % (ref 11.5–15.5)
WBC: 7 10*3/uL (ref 4.0–10.5)
nRBC: 0 % (ref 0.0–0.2)

## 2021-10-11 LAB — IMMUNOFIXATION ELECTROPHORESIS
IgA: 413 mg/dL (ref 61–437)
IgG (Immunoglobin G), Serum: 1127 mg/dL (ref 603–1613)
IgM (Immunoglobulin M), Srm: 102 mg/dL (ref 15–143)
Total Protein ELP: 6.2 g/dL (ref 6.0–8.5)

## 2021-10-11 LAB — MAGNESIUM: Magnesium: 2.2 mg/dL (ref 1.7–2.4)

## 2021-10-11 LAB — PHOSPHORUS: Phosphorus: 4.7 mg/dL — ABNORMAL HIGH (ref 2.5–4.6)

## 2021-10-11 MED ORDER — ENSURE ENLIVE PO LIQD
237.0000 mL | Freq: Two times a day (BID) | ORAL | Status: DC
  Administered 2021-10-11 – 2021-10-13 (×5): 237 mL via ORAL

## 2021-10-11 MED ORDER — TAMSULOSIN HCL 0.4 MG PO CAPS
0.4000 mg | ORAL_CAPSULE | Freq: Every day | ORAL | Status: DC
  Administered 2021-10-11 – 2021-10-14 (×4): 0.4 mg via ORAL
  Filled 2021-10-11 (×4): qty 1

## 2021-10-11 MED ORDER — POLYETHYLENE GLYCOL 3350 17 G PO PACK: 17.0000 g | PACK | Freq: Every day | ORAL | Status: DC | PRN

## 2021-10-11 MED ORDER — FINASTERIDE 5 MG PO TABS
5.0000 mg | ORAL_TABLET | Freq: Every day | ORAL | Status: DC
  Administered 2021-10-11 – 2021-10-14 (×4): 5 mg via ORAL
  Filled 2021-10-11 (×4): qty 1

## 2021-10-11 MED ORDER — RENA-VITE PO TABS
1.0000 | ORAL_TABLET | Freq: Every day | ORAL | Status: DC
  Administered 2021-10-11 – 2021-10-13 (×3): 1 via ORAL
  Filled 2021-10-11 (×3): qty 1

## 2021-10-11 MED ORDER — PANTOPRAZOLE SODIUM 40 MG PO TBEC
40.0000 mg | DELAYED_RELEASE_TABLET | Freq: Every day | ORAL | Status: DC
  Administered 2021-10-11 – 2021-10-15 (×5): 40 mg via ORAL
  Filled 2021-10-11 (×5): qty 1

## 2021-10-11 MED ORDER — DOCUSATE SODIUM 50 MG/5ML PO LIQD
100.0000 mg | Freq: Two times a day (BID) | ORAL | Status: DC | PRN
  Filled 2021-10-11: qty 10

## 2021-10-11 NOTE — Progress Notes (Addendum)
NAME:  Ryan Cortez, MRN:  [id number], DOB:  [birth year], LOS: 4 ADMISSION DATE:  10/07/2021, CONSULTATION DATE:  10/11/21 REFERRING MD:  EDP, CHIEF COMPLAINT:  AMS  History of Present Illness:  Ryan Cortez is a 87 y/o M with PMH significant for HTN, HL, CKD, DM who was found down in his home minimally responsive and covered in dark stool.  Daughter and patient's friend are at the bedside and state they last saw him on 11/15 and that someone working at his apartment complex must have called EMS.  He is very independent at baseline and still drives, though refuses to use his cane.  Pt was intubated on arrival. Work-up significant for renal insufficiency with creatinine of 9.3 and K of 7.2, Na 150 and CK 1800.  CXR without significant infiltrate.  He was given IVF, broad spectrum abx and temporizing measures for hyperkalemia and PCCM consulted. 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 but with  53mm area acute or subacute subdural with 34mm midline shift  Pertinent  Medical History  Anemia Arthritis hypertension Hyperlipidemia PVD CKD stage IV  Significant Hospital Events: Including procedures, antibiotic start and stop dates in addition to other pertinent events   11/28 Found down, intubated and given Cefepime/Vanc/Flagyl 11/29 UOP present 11/30 UOP ok, Cr trending down, BUN trending down still > 100, BP soft 12/1 No acute issues overnight, alert and following commands on vent 12/2 Tolerated extubation day prior, no acute events overnight    Lines/Tubes 11/28 ETT > 12/1 11/28 Foley >    Significant Imaging 11/28 CT head>Probable small acute or subacute right parietal subdural hemorrhage on a moderate-sized right hemispheric 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 Mass effect on the right brain with approximately 6 mm right to left midline shift. 11/28 renal ultrasound evidence of medical renal disease, few left benign-appearing renal cyst  Interim History / Subjective:  Awakes to  verbal stimuli, able to follow commands and alert and oriented x2, slightly confused to situation  Renal function continues to improve, 1.3L urine out in the last 24hrs  Objective   Blood pressure (!) 109/45, pulse 62, temperature 97.7 F (36.5 C), temperature source Oral, resp. rate 12, height 5\' 8"  (1.727 m), weight 60.8 kg, SpO2 100 %.    Vent Mode: PSV;CPAP FiO2 (%):  [40 %] 40 % PEEP:  [5 cmH20] 5 cmH20 Pressure Support:  [5 cmH20-8 cmH20] 5 cmH20   Intake/Output Summary (Last 24 hours) at 10/11/2021 7858 Last data filed at 10/11/2021 0600 Gross per 24 hour  Intake 2019.43 ml  Output 1370 ml  Net 649.43 ml    Filed Weights   10/09/21 0500 10/10/21 0405 10/11/21 0500  Weight: 54.6 kg 57.3 kg 60.8 kg   Physical exam General: Acute on chronically ill appearing frail elderly male lying in bed, in NAD HEENT: [place]/AT, MM pink/moist, PERRL,  Neuro: Alert and oriented x2, non-focal, generalized weakness  CV: s1s2 regular rate and rhythm, no murmur, rubs, or gallops,  PULM:  Clear to ascultation no increased work of breathing, no added breath sounds  GI: soft, bowel sounds active in all 4 quadrants, non-tender, non-distended Extremities: warm/dry, no edema  Skin: no rashes or lesions, deep tissue wound top left cheek   Resolved Hospital Problem list   Anion gap metabolic acidosis Acute Encephalopathy and Hypoxic Respiratory Failure Acute Hypoxic Respiratory Failure -extubated 12/1  Assessment & Plan:   Acute vs. 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 and 62mm acute vs subacute subdural -Not  on anti-coagulation, neurosurgery consulted on admission with no indications for surgical intervention -UDS and ETOH unremarkable P: Maintain neuro protective measures; goal for eurothermia, euglycemia, eunatermia, normoxia, and PCO2 goal of 35-40 Nutrition and bowel regiment  Seizure precautions  Aspirations precautions  Delirium precautions   Acute renal failure on CKD4  with hyperkalemia Possible Rhabdomyolysis Hypernatremia -Creatinine >9 on admission with last baseline in Epic ~3 last year -Per nephrology patient is poor candidate for dialysis P: Nephrology following  IV hydration per nephrology  Follow renal function  Monitor urine output Trend Bmet Avoid nephrotoxins Ensure adequate renal perfusion   Hypotension and Hypothermia -Improving with IVF and bair hugger -Sepsis ruled out antibiotic stopped 11/29  P: Supportive care   Baseline HTN, HL -Home medications include Norvasc, Lasix, and Cozaar 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   Best Practice (right click and "Reselect all SmartList Selections" daily)   Diet/type: tubefeeds DVT prophylaxis: SCD GI prophylaxis: PPI Lines: N/A Foley:  Yes, and it is still needed Code Status:  full code Last date of multidisciplinary goals of care discussion [discussed with Pt's daughter on admission and friend. Pt had no advanced directives and they had not discussed life support, want aggressive care and full code at this time]   Critical care time:     CRITICAL CARE Performed by: Takari Duncombe D. Harris  Total critical care time: 37 minutes  Critical care time was exclusive of separately billable procedures and treating other patients.  Critical care was necessary to treat or prevent imminent or life-threatening deterioration.  Critical care was time spent personally by me on the  following activities: development of treatment plan with patient and/or surrogate as well as nursing, discussions with consultants, evaluation of patient's response to treatment, examination of patient, obtaining history from patient or surrogate, ordering and performing treatments and interventions, ordering and review of laboratory studies, ordering and review of radiographic studies, pulse oximetry and re-evaluation of patient's condition.  Shavette Shoaff D. Kenton Kingfisher, NP [facility] See Amion for contact info If no response to pager , please call [phone] until 7pm After 7:00 pm call Elink  [phone]

## 2021-10-11 NOTE — Progress Notes (Signed)
Nutrition Follow-up  DOCUMENTATION CODES:   Underweight, Severe malnutrition in context of chronic illness  INTERVENTION:   - Ensure Enlive po BID, each supplement provides 350 kcal and 20 grams of protein  - Magic Cup TID with meals, each supplement provides 290 kcal and 9 grams of protein  - Renal MVI with minerals daily  - Encourage PO intake and provide feeding assistance as needed  NUTRITION DIAGNOSIS:   Severe Malnutrition related to chronic illness (CKD IV) as evidenced by severe muscle depletion, severe fat depletion.  New diagnosis after completion of NFPE  GOAL:   Patient will meet greater than or equal to 90% of their needs  Progressing  MONITOR:   PO intake, Supplement acceptance, Labs, Weight trends, Skin, I & O's  REASON FOR ASSESSMENT:   Ventilator, Consult Enteral/tube feeding initiation and management  ASSESSMENT:   87 year old male who presented to the ED on 11/28 after being found down at home. Pt required intubation in the ED. PMH of HTN, HLD, CKD, T2DM, BPH, pyloric stenosis, CKD IV. Pt admitted with AKI, acute encephalopathy, and hypoxic respiratory failure. CT head showing acute on chronic SDH.  12/01 - extubated 12/02 - diet advanced to dysphagia 2 with thin liquids  Discussed pt with RN and during ICU rounds. Per Nephrology, pt is nonoliguric and creatinine level has continued to improve. Expect continued renal recovery. Nephrology has signed off.  Spoke with pt at bedside. Pt unable to provide any diet or weight history at this time. Diet was advanced to dysphagia 2 with thin liquids this morning after SLP evaluation. Question whether pt will be able to meet kcal and protein needs via PO route. RD to provide oral nutrition supplements to aid pt in meeting kcal and protein needs.  Admit weight: 50.6 kg Current weight: 60.8 kg  Weight up ~10 kg since admission. Pt with deep pitting edema to BUE and mild pitting edema to BLE.  Medications  reviewed and include: SSI q 4 hours, protonix IVF: D5-1/2NS @ 75 ml/hr  Labs reviewed: BUN 139, creatinine 4.45, phosphorus 4.7 (trending down), hemoglobin 7.5, platelets 90 CBG's: 58-109 x 24 hours  UOP: 1370 ml x 24 hours I/O's: +6.2 L since admit  NUTRITION - FOCUSED PHYSICAL EXAM:  Flowsheet Row Most Recent Value  Orbital Region Severe depletion  Upper Arm Region Mild depletion  [difficult to assess due to edema]  Thoracic and Lumbar Region Severe depletion  Buccal Region Severe depletion  Temple Region Severe depletion  Clavicle Bone Region Severe depletion  Clavicle and Acromion Bone Region Severe depletion  Scapular Bone Region Severe depletion  Dorsal Hand Moderate depletion  Patellar Region Severe depletion  Anterior Thigh Region Severe depletion  Posterior Calf Region Severe depletion  Edema (RD Assessment) Severe  [BUE]  Hair Reviewed  Eyes Reviewed  Mouth Reviewed  Skin Reviewed  Nails Reviewed       Diet Order:   Diet Order             DIET DYS 2 Room service appropriate? Yes; Fluid consistency: Thin  Diet effective now                   EDUCATION NEEDS:   Not appropriate for education at this time  Skin:  Skin Assessment: Skin Integrity Issues: DTI: R knee, L face Stage II: coccyx Other: abrasions to sternum, L knee  Last BM:  10/10/21 type 6  Height:   Ht Readings from Last 1 Encounters:  10/07/21 5\' 8"  (  1.727 m)    Weight:   Wt Readings from Last 1 Encounters:  10/11/21 60.8 kg    BMI:  Body mass index is 20.38 kg/m.  Estimated Nutritional Needs:   Kcal:  1500-1700  Protein:  75-90 grams  Fluid:  1.5-1.7 L    Gustavus Bryant, MS, RD, LDN Inpatient Clinical Dietitian Please see AMiON for contact information.

## 2021-10-11 NOTE — Evaluation (Signed)
Clinical/Bedside Swallow Evaluation Patient Details  Name: Ryan Cortez MRN: [id number] Date of Birth: [birth year]  Today's Date: 10/11/2021 Time: SLP Start Time (ACUTE ONLY): 0855 SLP Stop Time (ACUTE ONLY): 0912 SLP Time Calculation (min) (ACUTE ONLY): 17 min  Past Medical History: No past medical history on file. Past Surgical History:  HPI:  Ryan Cortez is a 87 y/o M with PMH significant for HTN, HL, CKD, DM who was found down in his home minimally responsive and covered in dark stool.  Daughter and patient's friend are at the bedside and state they last saw him on 11/15 and that someone working at his apartment complex must have called EMS.  He is very independent at baseline and still drives, though refuses to use his cane.  Pt was intubated on arrival 11/28.  Work-up significant for renal insufficiency with creatinine of 9.3 and K of 7.2, Na 150 and CK 1800.  CXR without significant infiltrate.  He was given IVF, broad spectrum abx and temporizing measures for hyperkalemia and PCCM consulted. 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 Extubated 12/1.    Assessment / Plan / Recommendation  Clinical Impression  Pt demonstrates good signs of tolerance of PO. His vocal quality is relatively clear. He is able to complete throat clearing to clear secretions as needed at baseline. His lips are dry but his oral mucosa is healthy. Pt demonstrates toelrance of thin liquids in succession with no coughing or wet vocal quality. He does clear his throat intermittently throughout assessment after all textures. Recommend pt initaite dys 2 given poor dentition and no dentures available. Will f/u for tolerance given advanced age and potential need for further modification of solid textures. SLP Visit Diagnosis: Dysphagia, unspecified (R13.10)    Aspiration Risk  Mild aspiration risk    Diet Recommendation Dysphagia 2  (Fine chop);Thin liquid   Liquid Administration via: Cup;Straw Medication Administration: Whole meds with liquid Supervision: Staff to assist with self feeding Compensations: Slow rate;Small sips/bites Postural Changes: Seated upright at 90 degrees    Other  Recommendations Oral Care Recommendations: Oral care BID    Recommendations for follow up therapy are one component of a multi-disciplinary discharge planning process, led by the attending physician.  Recommendations may be updated based on patient status, additional functional criteria and insurance authorization.  Follow up Recommendations No SLP follow up      Assistance Recommended at Discharge    Functional Status Assessment Patient has had a recent decline in their functional status and demonstrates the ability to make significant improvements in function in a reasonable and predictable amount of time.  Frequency and Duration            Prognosis Prognosis for Safe Diet Advancement: Good      Swallow Study   General HPI: Ryan Cortez is a 87 y/o M with PMH significant for HTN, HL, CKD, DM who was found down in his home minimally responsive and covered in dark stool.  Daughter and patient's friend are at the bedside and state they last saw him on 11/15 and that someone working at his apartment complex must have called EMS.  He is very independent at baseline and still drives, though refuses to use his cane.  Pt was intubated on arrival 11/28.  Work-up significant for renal insufficiency with creatinine of 9.3 and K of 7.2, Na 150 and CK 1800.  CXR without significant infiltrate.  He was given IVF, broad spectrum abx and temporizing measures for hyperkalemia and PCCM consulted. 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 Extubated 12/1. Type of Study: Bedside Swallow Evaluation Previous Swallow Assessment: noen Diet Prior to this Study:  NPO Temperature Spikes Noted: No Respiratory Status: Nasal cannula History of Recent Intubation: Yes Length of Intubations (days): 2 days Date extubated: 10/10/21 Behavior/Cognition: Alert;Cooperative;Pleasant mood Oral Cavity Assessment: Within Functional Limits Oral Care Completed by SLP: No Oral Cavity - Dentition: Poor condition;Missing dentition;Dentures, not available Vision: Functional for self-feeding Self-Feeding Abilities: Needs assist Patient Positioning: Upright in bed Baseline Vocal Quality: Normal Volitional Cough: Strong Volitional Swallow: Able to elicit    Oral/Motor/Sensory Function Overall Oral Motor/Sensory Function: Mild impairment Facial ROM: Reduced left;Suspected CN VII (facial) dysfunction Facial Symmetry: Abnormal symmetry left;Suspected CN VII (facial) dysfunction Facial Strength: Within Functional Limits Facial Sensation: Within Functional Limits Lingual ROM: Within Functional Limits Lingual Symmetry: Within Functional Limits Lingual Strength: Within Functional Limits Lingual Sensation: Within Functional Limits Velum: Within Functional Limits Mandible: Within Functional Limits   Ice Chips     Thin Liquid Thin Liquid: Impaired Presentation: Straw Pharyngeal  Phase Impairments: Throat Clearing - Delayed    Nectar Thick Nectar Thick Liquid: Not tested   Honey Thick Honey Thick Liquid: Not tested   Puree Puree: Impaired Presentation: Spoon Pharyngeal Phase Impairments: Throat Clearing - Delayed   Solid            Edwyna Dangerfield, Katherene Ponto 10/11/2021,9:15 AM

## 2021-10-11 NOTE — Progress Notes (Signed)
Patient transferred to 5M10 with all belongings including upper dentures. Report called and family notified of transfer.

## 2021-10-11 NOTE — Progress Notes (Signed)
PCCM Progress Note   Called by nurse to evaluate facial wounds.   There is a large wound to left cheek which appears to be a deep tissue wound with eschar to center. On chart review EMS report states that patient was seen with a "large abrasion to left cheek bone" on arrival. Patient was also found "prone" per EMS.   There also appears to be a deep tissue wound to left chin area (difficult to visualize with beard hair present)   During full assessment of chin/lip wound I also noticed there is another wound at the base of patient lower tooth.This appears to be either a abscess or continued pressure injury to gum (however all other oral mucosa is pink and healthy).   Facial wounds likely acquired when patient was prone for unknown time prior to admission.   None of the above mentioned wound appear infected currently and all areas were reviewed with attending. At this time no acute indications for antibiotic coverage. Patient should see Dentist in the outpatient setting         Aylah Yeary D. Kenton Kingfisher, NP-C [facility] Personal contact information can be found on Amion  10/11/2021, 4:47 PM

## 2021-10-11 NOTE — Progress Notes (Signed)
[facility] NEPHROLOGY PROGRESS NOTE  Assessment/ Plan: Pt is a 87 y.o. yo male  PMH significant for DM type 2, HTN, BPH, HLD, pyloric stenosis, h/o + PPD, and CKD stage IV who was found down in his home, unresponsive, seen as a consultation for AKI on CKD and hyperkalemia.  Seen by Dr. Justin Mend in 10/2020 when creatinine level was 3.1.  #Acute kidney injury on CKD stage IV, nonoliguric: Multifactorial etiology including ischemic ATN due to severe dehydration, BOO versus progression of underlying CKD. Urinalysis with few RBC and protein creatinine ratio 0.3.  Kappa lambda ratio unremarkable.  Kidney ultrasound with chronic findings and cyst.  Foley catheter was inserted and treated with IV fluid.   The creatinine level continue to improve and patient is nonoliguric.  Serum sodium level improved.  He is now extubated and started on oral diet.  Recommend to discontinue IV fluid when he can take orally. Given his advanced ages, comorbidities including subdural hematoma, I think he will do poorly with dialysis therefore I will not recommended it.  Fortunately, he is having renal recovery and no need for dialysis at this time.  I recommend palliative care consult to discuss goals of care. Expect continue renal recovery.  #Hypernatremia, hypovolemic: Improved with free water, off of free water now.  #Hyperkalemia: Improved with medical management.  #Anion gap metabolic acidosis: Improving, off of IV sodium bicarbonate.  Monitor serum CO2 level.  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   Seen by neurosurgeon, not operative candidate.  Monitor neuro status.  #Hypocalcemia: Continue to replete IV calcium.  Monitor lab.  #Ventilator dependent respiratory failure: He is extubated.  Managed by PCCM.  #Hypotension improved with IV fluid.  Sign off, please call back with question.  Subjective: Seen and examined ICU.  Urine output  around 1.3 L.  He is extubated.  Alert awake but confused.  Review of system limited. Objective Vital signs in last 24 hours: Vitals:   10/11/21 0500 10/11/21 0530 10/11/21 0600 10/11/21 0710  BP: (!) 104/53 (!) 101/46 (!) 109/45   Pulse: 78 (!) 56 62   Resp: 14 11 12    Temp: (!) 96.8 F (36 C) (!) 96.8 F (36 C) (!) 96.8 F (36 C) 97.7 F (36.5 C)  TempSrc:    Oral  SpO2: 100% 100% 100%   Weight: 60.8 kg     Height:       Weight change: 3.5 kg  Intake/Output Summary (Last 24 hours) at 10/11/2021 0910 Last data filed at 10/11/2021 0600 Gross per 24 hour  Intake 1779.33 ml  Output 1110 ml  Net 669.33 ml        Labs: Basic Metabolic Panel: Recent Labs  Lab 10/09/21 0436 10/09/21 1631 10/10/21 0302 10/11/21 0417  NA 146*  --  143 142  K 4.4  --  4.6 4.4  CL 113*  --  113* 113*  CO2 21*  --  20* 20*  GLUCOSE 132*  --  129* 114*  BUN 167*  --  156* 139*  CREATININE 6.32*  --  5.16* 4.45*  CALCIUM 6.5*  --  6.3* 6.8*  PHOS 5.3* 5.2* 5.0* 4.7*    Liver Function Tests: Recent Labs  Lab 10/07/21 1805 10/07/21 2259 10/11/21 0417  AST 52*  --  38  ALT 61*  --  42  ALKPHOS 32*  --  27*  BILITOT 0.9  --  0.5  PROT 5.5*  --  4.5*  ALBUMIN 2.6* 2.4* 1.8*    No results for input(s): LIPASE, AMYLASE in the last 168 hours. Recent Labs  Lab 10/07/21 1805  AMMONIA 13    CBC: Recent Labs  Lab 10/07/21 1805 10/07/21 1812 10/07/21 2115 10/08/21 0240 10/08/21 0450 10/08/21 0541 10/09/21 0436 10/11/21 0417  WBC 10.2  --  10.0 7.1  --  5.3 8.1 7.0  NEUTROABS 9.6*  --   --   --   --   --  7.3  --   HGB 8.4*   < > 7.2* 7.6*   < > 7.3* 7.4* 7.5*  HCT 27.0*   < > 23.3* 23.3*   < > 22.4* 22.3* 23.6*  MCV 94.1  --  92.5 87.3  --  89.2 88.8 91.5  PLT 110*  --  95* 108*  --  99* 83* 90*   < > = values in this interval not displayed.    Cardiac Enzymes: Recent Labs  Lab 10/07/21 1805 10/08/21 0240  CKTOTAL 1,839* 1,707*    CBG: Recent Labs  Lab  10/10/21 1945 10/10/21 2050 10/10/21 2313 10/11/21 0350 10/11/21 0753  GLUCAP 58* 101* 101* 107* 102*     Iron Studies: No results for input(s): IRON, TIBC, TRANSFERRIN, FERRITIN in the last 72 hours. Studies/Results: No results found.  Medications: Infusions:  sodium chloride Stopped (10/07/21 2059)   dextrose 5 % and 0.45% NaCl 75 mL/hr at 10/11/21 0600    Scheduled Medications:  Chlorhexidine Gluconate Cloth  6 each Topical Daily   finasteride  5 mg Oral Daily   insulin aspart  0-6 Units Subcutaneous Q4H   mouth rinse  15 mL Mouth Rinse BID   pantoprazole  40 mg Oral QHS   tamsulosin  0.4 mg Oral Daily    have reviewed scheduled and prn medications.  Physical Exam: General: Elderly male who is extubated now, alert awake and opens eyes. Heart:RRR, s1s2 nl Lungs: Clear anteriorly, no wheezing Abdomen:soft,  non-distended Extremities:No leg edema Neurology: Opening eyes. Priscillia Fouch Prasad Anneka Studer 10/11/2021,9:10 AM  LOS: 4 days

## 2021-10-12 DIAGNOSIS — I5189 Other ill-defined heart diseases: Secondary | ICD-10-CM

## 2021-10-12 DIAGNOSIS — G9341 Metabolic encephalopathy: Secondary | ICD-10-CM

## 2021-10-12 DIAGNOSIS — I493 Ventricular premature depolarization: Secondary | ICD-10-CM | POA: Diagnosis not present

## 2021-10-12 DIAGNOSIS — Z7189 Other specified counseling: Secondary | ICD-10-CM

## 2021-10-12 DIAGNOSIS — E43 Unspecified severe protein-calorie malnutrition: Secondary | ICD-10-CM

## 2021-10-12 LAB — CBC
HCT: 23.8 % — ABNORMAL LOW (ref 39.0–52.0)
Hemoglobin: 7.5 g/dL — ABNORMAL LOW (ref 13.0–17.0)
MCH: 28.8 pg (ref 26.0–34.0)
MCHC: 31.5 g/dL (ref 30.0–36.0)
MCV: 91.5 fL (ref 80.0–100.0)
Platelets: 111 10*3/uL — ABNORMAL LOW (ref 150–400)
RBC: 2.6 MIL/uL — ABNORMAL LOW (ref 4.22–5.81)
RDW: 14.3 % (ref 11.5–15.5)
WBC: 8.1 10*3/uL (ref 4.0–10.5)
nRBC: 0 % (ref 0.0–0.2)

## 2021-10-12 LAB — GLUCOSE, CAPILLARY
Glucose-Capillary: 102 mg/dL — ABNORMAL HIGH (ref 70–99)
Glucose-Capillary: 111 mg/dL — ABNORMAL HIGH (ref 70–99)
Glucose-Capillary: 120 mg/dL — ABNORMAL HIGH (ref 70–99)
Glucose-Capillary: 123 mg/dL — ABNORMAL HIGH (ref 70–99)
Glucose-Capillary: 134 mg/dL — ABNORMAL HIGH (ref 70–99)
Glucose-Capillary: 85 mg/dL (ref 70–99)

## 2021-10-12 LAB — BASIC METABOLIC PANEL
Anion gap: 9 (ref 5–15)
BUN: 124 mg/dL — ABNORMAL HIGH (ref 8–23)
CO2: 21 mmol/L — ABNORMAL LOW (ref 22–32)
Calcium: 7.1 mg/dL — ABNORMAL LOW (ref 8.9–10.3)
Chloride: 111 mmol/L (ref 98–111)
Creatinine, Ser: 3.93 mg/dL — ABNORMAL HIGH (ref 0.61–1.24)
GFR, Estimated: 14 mL/min — ABNORMAL LOW (ref >60)
Glucose, Bld: 111 mg/dL — ABNORMAL HIGH (ref 70–99)
Potassium: 4.3 mmol/L (ref 3.5–5.1)
Sodium: 141 mmol/L (ref 135–145)

## 2021-10-12 LAB — CULTURE, BLOOD (ROUTINE X 2): Culture: NO GROWTH

## 2021-10-12 MED ORDER — METOPROLOL TARTRATE 12.5 MG HALF TABLET
12.5000 mg | ORAL_TABLET | Freq: Two times a day (BID) | ORAL | Status: DC
  Administered 2021-10-12 – 2021-10-14 (×4): 12.5 mg via ORAL
  Filled 2021-10-12 (×4): qty 1

## 2021-10-12 NOTE — Progress Notes (Signed)
Patient ID: Ryan Cortez, male   DOB: 1927-03-23, 87 y.o.   MRN: [id number]  PROGRESS NOTE    Ryan Cortez  FXJ:[id number] DOB: 1927-02-03 DOA: 10/07/2021 PCP: Horald Pollen, MD   Brief Narrative:  87 year old male with history of hypertension, hyperlipidemia, diabetes mellitus type 2, CKD stage IV was found in his home minimally responsive covered in dark stool.  Patient was intubated on arrival; he was found to have creatinine of 9.3, potassium of 7.2, sodium 150 and CK18 100.  Chest x-ray without significant infiltrate.  He was started on IV fluids, broad-spectrum antibiotics and admitted to ICU under PCCM service.  Nephrology was consulted.  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  Neurosurgery recommended conservative management.  He was extubated on 10/10/2021.  2D echo showed right atrial mass.  He was transferred to [facility] service from 10/12/2021 onwards.  Assessment & Plan:   Acute respiratory failure with hypoxia -Intubated on presentation.  Extubated on 10/10/2021.  Transferred to [facility] service from 10/12/2021 onwards. -Currently on 2 L oxygen via nasal cannula.  Wean off as able.  Initial chest x-ray was negative for infiltrates.  Acute metabolic encephalopathy -Patient was found unresponsive and needed intubation on presentation. -Unclear if the patient has dementia as well -CT findings as above.  Mental status slightly improved.  Monitor mental status.  Fall precautions. -PT eval.  Diet as per SLP recommendations  Acute versus subacute right parietal subdural hematoma -CT head with old hygroma and 4 mm acute versus subacute subdural hematoma.  Neurosurgery recommended no surgical intervention. -Seizure/aspiration/delirium precautions  Acute kidney injury on chronic kidney disease stage IV hyperkalemia: Resolved Possible rhabdomyolysis Hypernatremia: Resolved -Creatinine  more than 9 on presentation.  Baseline creatinine in epic around 3 last year. -Nephrology followed the patient and signed off on 10/11/2021.  Currently on IV fluids.  Creatinine 3.93 today.  Acidosis improving.  Right lateral mass -2D echo showed 0.2 cm x 0.8 cm mass within the right atrium; does not appear to be attached to tricuspid valve with low suspicion for vegetation.  Might need TEE once a little more stable.  Generalized deconditioning Severe protein calorie malnutrition -Follow nutrition recommendations.  Overall prognosis is very poor.  Palliative care consultation for goals of care discussion. -PT eval  Aerococcus in blood culture Staph capitis in blood culture -Possibly contaminant.  Repeat blood cultures.  Currently off of antibiotics.  BPH -Continue tamsulosin/finasteride   DVT prophylaxis: SCDs Code Status: Full Family Communication: None at bedside Disposition Plan: Status is: Inpatient  Remains inpatient appropriate because: Of need for PT eval.  Continued need for IV fluids.  Consultants: PCCM/nephrology.  Consult palliative care  Procedures: Intubation/extubation.  Echo  Antimicrobials:  Anti-infectives (From admission, onward)    Start     Dose/Rate Route Frequency Ordered Stop   10/08/21 2300  ceFEPIme (MAXIPIME) 1 g in sodium chloride 0.9 % 100 mL IVPB  Status:  Discontinued        1 g 200 mL/hr over 30 Minutes Intravenous Every 24 hours 10/07/21 2232 10/08/21 0848   10/07/21 1900  ceFEPIme (MAXIPIME) 2 g in sodium chloride 0.9 % 100 mL IVPB        2 g 200 mL/hr over 30 Minutes Intravenous  Once 10/07/21 1846 10/07/21 2014   10/07/21 1900  metroNIDAZOLE (FLAGYL) IVPB 500 mg  Status:  Discontinued  500 mg 100 mL/hr over 60 Minutes Intravenous Every 12 hours 10/07/21 1846 10/08/21 0847   10/07/21 1900  vancomycin (VANCOREADY) IVPB 1250 mg/250 mL        1,250 mg 166.7 mL/hr over 90 Minutes Intravenous  Once 10/07/21 1846 10/07/21 2234   10/07/21  1848  vancomycin variable dose per unstable renal function (pharmacist dosing)  Status:  Discontinued         Does not apply See admin instructions 10/07/21 1849 10/08/21 0847        Subjective: Patient seen and examined at bedside.  Extremely poor historian.  No overnight fever, seizures reported.  Objective: Vitals:   10/11/21 2030 10/11/21 2353 10/12/21 0434 10/12/21 0939  BP: (!) 118/54 (!) 114/53 115/66 (!) 118/58  Pulse: 74 72 70 (!) 51  Resp: 18 17 18 16   Temp: 97.7 F (36.5 C) 97.8 F (36.6 C) 97.9 F (36.6 C) 98.2 F (36.8 C)  TempSrc: Oral Oral  Oral  SpO2: 100% 100% 100% 93%  Weight: 62.9 kg     Height:        Intake/Output Summary (Last 24 hours) at 10/12/2021 1043 Last data filed at 10/12/2021 0254 Gross per 24 hour  Intake 1621.36 ml  Output 550 ml  Net 1071.36 ml   Filed Weights   10/10/21 0405 10/11/21 0500 10/11/21 2030  Weight: 57.3 kg 60.8 kg 62.9 kg    Examination:  General exam: Appears calm and comfortable.  Elderly male lying in bed.  Currently on 2 L oxygen by nasal cannula. Respiratory system: Bilateral decreased breath sounds at bases with some scattered crackles Cardiovascular system: S1 & S2 heard, Rate controlled Gastrointestinal system: Abdomen is nondistended, soft and nontender. Normal bowel sounds heard. Extremities: No cyanosis, clubbing; trace lower extremity edema Central nervous system: Awake, extremely slow to respond, extremely poor historian.  No focal neurological deficits. Moving extremities Skin: No obvious ecchymosis/petechiae  psychiatry: Could not be assessed because of mental status   Data Reviewed: I have personally reviewed following labs and imaging studies  CBC: Recent Labs  Lab 10/07/21 1805 10/07/21 1812 10/08/21 0240 10/08/21 0450 10/08/21 0541 10/09/21 0436 10/11/21 0417 10/12/21 0214  WBC 10.2   < > 7.1  --  5.3 8.1 7.0 8.1  NEUTROABS 9.6*  --   --   --   --  7.3  --   --   HGB 8.4*   < > 7.6* 6.8*  7.3* 7.4* 7.5* 7.5*  HCT 27.0*   < > 23.3* 20.0* 22.4* 22.3* 23.6* 23.8*  MCV 94.1   < > 87.3  --  89.2 88.8 91.5 91.5  PLT 110*   < > 108*  --  99* 83* 90* 111*   < > = values in this interval not displayed.   Basic Metabolic Panel: Recent Labs  Lab 10/08/21 1324 10/08/21 1556 10/09/21 0436 10/09/21 1631 10/10/21 0302 10/11/21 0417 10/12/21 0214  NA 152*  --  146*  --  143 142 141  K 5.0  --  4.4  --  4.6 4.4 4.3  CL 118*  --  113*  --  113* 113* 111  CO2 20*  --  21*  --  20* 20* 21*  GLUCOSE 115*  --  132*  --  129* 114* 111*  BUN 186*  --  167*  --  156* 139* 124*  CREATININE 7.42*  --  6.32*  --  5.16* 4.45* 3.93*  CALCIUM 6.3*  --  6.5*  --  6.3* 6.8* 7.1*  MG  --  2.7* 2.4 2.3 2.1 2.2  --   PHOS  --  7.4* 5.3* 5.2* 5.0* 4.7*  --    GFR: Estimated Creatinine Clearance: 10.7 mL/min (A) (by C-G formula based on SCr of 3.93 mg/dL (H)). Liver Function Tests: Recent Labs  Lab 10/07/21 1805 10/07/21 2259 10/11/21 0417  AST 52*  --  38  ALT 61*  --  42  ALKPHOS 32*  --  27*  BILITOT 0.9  --  0.5  PROT 5.5*  --  4.5*  ALBUMIN 2.6* 2.4* 1.8*   No results for input(s): LIPASE, AMYLASE in the last 168 hours. Recent Labs  Lab 10/07/21 1805  AMMONIA 13   Coagulation Profile: Recent Labs  Lab 10/07/21 1805  INR 1.3*   Cardiac Enzymes: Recent Labs  Lab 10/07/21 1805 10/08/21 0240  CKTOTAL 1,839* 1,707*   BNP (last 3 results) No results for input(s): PROBNP in the last 8760 hours. HbA1C: No results for input(s): HGBA1C in the last 72 hours. CBG: Recent Labs  Lab 10/11/21 1553 10/11/21 1717 10/11/21 2036 10/12/21 0434 10/12/21 0729  GLUCAP 143* 134* 88 102* 85   Lipid Profile: No results for input(s): CHOL, HDL, LDLCALC, TRIG, CHOLHDL, LDLDIRECT in the last 72 hours. Thyroid Function Tests: No results for input(s): TSH, T4TOTAL, FREET4, T3FREE, THYROIDAB in the last 72 hours. Anemia Panel: No results for input(s): VITAMINB12, FOLATE, FERRITIN,  TIBC, IRON, RETICCTPCT in the last 72 hours. Sepsis Labs: Recent Labs  Lab 10/07/21 1805  LATICACIDVEN 1.2    Recent Results (from the past 240 hour(s))  Blood Cultures (routine x 2)     Status: Abnormal   Collection Time: 10/07/21  6:00 PM   Specimen: BLOOD RIGHT ARM  Result Value Ref Range Status   Specimen Description BLOOD RIGHT ARM  Final   Special Requests   Final    BOTTLES DRAWN AEROBIC AND ANAEROBIC Blood Culture results may not be optimal due to an inadequate volume of blood received in culture bottles   Culture  Setup Time   Final    GRAM POSITIVE COCCI IN CLUSTERS AEROBIC BOTTLE ONLY CRITICAL RESULT CALLED TO, READ BACK BY AND VERIFIED WITHTillman Sers PHARMD [id number] 10/08/21 A BROWNING    Culture (A)  Final    STAPHYLOCOCCUS CAPITIS THE SIGNIFICANCE OF ISOLATING THIS ORGANISM FROM A SINGLE SET OF BLOOD CULTURES WHEN MULTIPLE SETS ARE DRAWN IS UNCERTAIN. PLEASE NOTIFY THE MICROBIOLOGY DEPARTMENT WITHIN ONE WEEK IF SPECIATION AND SENSITIVITIES ARE REQUIRED. Performed at [facility], [place] [address] [place], [place] [address]    Report Status 10/09/2021 FINAL  Final  Blood Culture ID Panel (Reflexed)     Status: Abnormal   Collection Time: 10/07/21  6:00 PM  Result Value Ref Range Status   Enterococcus faecalis NOT DETECTED NOT DETECTED Final   Enterococcus Faecium NOT DETECTED NOT DETECTED Final   Listeria monocytogenes NOT DETECTED NOT DETECTED Final   Staphylococcus species DETECTED (A) NOT DETECTED Final    Comment: CRITICAL RESULT CALLED TO, READ BACK BY AND VERIFIED WITHTillman Sers PHARMD 0277 10/08/21 A BROWNING    Staphylococcus aureus (BCID) NOT DETECTED NOT DETECTED Final   Staphylococcus epidermidis NOT DETECTED NOT DETECTED Final   Staphylococcus lugdunensis NOT DETECTED NOT DETECTED Final   Streptococcus species NOT DETECTED NOT DETECTED Final   Streptococcus agalactiae NOT DETECTED NOT DETECTED Final   Streptococcus pneumoniae NOT DETECTED NOT  DETECTED Final   Streptococcus pyogenes NOT  DETECTED NOT DETECTED Final   A.calcoaceticus-baumannii NOT DETECTED NOT DETECTED Final   Bacteroides fragilis NOT DETECTED NOT DETECTED Final   Enterobacterales NOT DETECTED NOT DETECTED Final   Enterobacter cloacae complex NOT DETECTED NOT DETECTED Final   Escherichia coli NOT DETECTED NOT DETECTED Final   Klebsiella aerogenes NOT DETECTED NOT DETECTED Final   Klebsiella oxytoca NOT DETECTED NOT DETECTED Final   Klebsiella pneumoniae NOT DETECTED NOT DETECTED Final   Proteus species NOT DETECTED NOT DETECTED Final   Salmonella species NOT DETECTED NOT DETECTED Final   Serratia marcescens NOT DETECTED NOT DETECTED Final   Haemophilus influenzae NOT DETECTED NOT DETECTED Final   Neisseria meningitidis NOT DETECTED NOT DETECTED Final   Pseudomonas aeruginosa NOT DETECTED NOT DETECTED Final   Stenotrophomonas maltophilia NOT DETECTED NOT DETECTED Final   Candida albicans NOT DETECTED NOT DETECTED Final   Candida auris NOT DETECTED NOT DETECTED Final   Candida glabrata NOT DETECTED NOT DETECTED Final   Candida krusei NOT DETECTED NOT DETECTED Final   Candida parapsilosis NOT DETECTED NOT DETECTED Final   Candida tropicalis NOT DETECTED NOT DETECTED Final   Cryptococcus neoformans/gattii NOT DETECTED NOT DETECTED Final    Comment: Performed at [facility], [address], [place], [place] [address]  Urine Culture     Status: None   Collection Time: 10/07/21  6:06 PM   Specimen: Urine, Catheterized  Result Value Ref Range Status   Specimen Description URINE, CATHETERIZED  Final   Special Requests NONE  Final   Culture   Final    NO GROWTH Performed at [facility], [address], [place], [place] [address]    Report Status 10/08/2021 FINAL  Final  Resp Panel by RT-PCR (Flu A&B, Covid) Urine, Clean Catch     Status: None   Collection Time: 10/07/21  6:07 PM   Specimen: Urine, Clean Catch; Nasopharyngeal(NP) swabs in vial  transport medium  Result Value Ref Range Status   SARS Coronavirus 2 by RT PCR NEGATIVE NEGATIVE Final    Comment: (NOTE) SARS-CoV-2 target nucleic acids are NOT DETECTED.  The SARS-CoV-2 RNA is generally detectable in upper respiratory specimens during the acute phase of infection. The lowest concentration of SARS-CoV-2 viral copies this assay can detect is 138 copies/mL. A negative result does not preclude SARS-Cov-2 infection and should not be used as the sole basis for treatment or other patient management decisions. A negative result may occur with  improper specimen collection/handling, submission of specimen other than nasopharyngeal swab, presence of viral mutation(s) within the areas targeted by this assay, and inadequate number of viral copies(<138 copies/mL). A negative result must be combined with clinical observations, patient history, and epidemiological information. The expected result is Negative.  Fact Sheet for Patients:  EntrepreneurPulse.com.au  Fact Sheet for Healthcare Providers:  IncredibleEmployment.be  This test is no t yet approved or cleared by the [place] FDA and  has been authorized for detection and/or diagnosis of SARS-CoV-2 by FDA under an Emergency Use Authorization (EUA). This EUA will remain  in effect (meaning this test can be used) for the duration of the COVID-19 declaration under Section 564(b)(1) of the Act, 21 U.S.C.section 360bbb-3(b)(1), unless the authorization is terminated  or revoked sooner.       Influenza A by PCR NEGATIVE NEGATIVE Final   Influenza B by PCR NEGATIVE NEGATIVE Final    Comment: (NOTE) The Xpert Xpress SARS-CoV-2/FLU/RSV plus assay is intended as an aid in the diagnosis of influenza  from Nasopharyngeal swab specimens and should not be used as a sole basis for treatment. Nasal washings and aspirates are unacceptable for Xpert Xpress SARS-CoV-2/FLU/RSV testing.  Fact  Sheet for Patients: EntrepreneurPulse.com.au  Fact Sheet for Healthcare Providers: IncredibleEmployment.be  This test is not yet approved or cleared by the [place] FDA and has been authorized for detection and/or diagnosis of SARS-CoV-2 by FDA under an Emergency Use Authorization (EUA). This EUA will remain in effect (meaning this test can be used) for the duration of the COVID-19 declaration under Section 564(b)(1) of the Act, 21 U.S.C. section 360bbb-3(b)(1), unless the authorization is terminated or revoked.  Performed at [facility], [place] [address], [place], [place] [address]   Blood Cultures (routine x 2)     Status: Abnormal   Collection Time: 10/07/21  7:05 PM   Specimen: Right Antecubital; Blood  Result Value Ref Range Status   Specimen Description RIGHT ANTECUBITAL  Final   Special Requests (A)  Final    AEROCOCCUS SPECIES Blood Culture results may not be optimal due to an inadequate volume of blood received in culture bottles   Culture   Final    NO GROWTH 5 DAYS Performed at [facility], [place] [address] [place], [place] [address]    Report Status 10/12/2021 FINAL  Final  MRSA Next Gen by PCR, Nasal     Status: None   Collection Time: 10/07/21 10:13 PM   Specimen: Nasal Mucosa; Nasal Swab  Result Value Ref Range Status   MRSA by PCR Next Gen NOT DETECTED NOT DETECTED Final    Comment: (NOTE) The GeneXpert MRSA Assay (FDA approved for NASAL specimens only), is one component of a comprehensive MRSA colonization surveillance program. It is not intended to diagnose MRSA infection nor to guide or monitor treatment for MRSA infections. Test performance is not FDA approved in patients less than 2 years old. Performed at [facility], [place] [address] [place],  [address]          Radiology Studies: No results found.      Scheduled Meds:  Chlorhexidine Gluconate Cloth  6 each Topical Daily    feeding supplement  237 mL Oral BID BM   finasteride  5 mg Oral Daily   insulin aspart  0-6 Units Subcutaneous Q4H   mouth rinse  15 mL Mouth Rinse BID   multivitamin  1 tablet Oral QHS   pantoprazole  40 mg Oral QHS   tamsulosin  0.4 mg Oral Daily   Continuous Infusions:  sodium chloride Stopped (10/07/21 2059)   dextrose 5 % and 0.45% NaCl 75 mL/hr at 10/12/21 0222          Aline August, MD [facility] 10/12/2021, 10:43 AM

## 2021-10-12 NOTE — Consult Note (Addendum)
Cardiology Consultation:   Patient ID: Ryan Cortez MRN: [id number]; DOB: Jun 10, 1927  Admit date: 10/07/2021 Date of Consult: 10/12/2021  PCP:  Horald Pollen, MD   [place] Providers Cardiologist:  None   {     Patient Profile:   Ryan Cortez is a 87 y.o. male with a hx of HTN, HLD, DMII, CKD IV who is being seen 10/12/2021 for the evaluation of RA mass and PVCs at the request of Dr. Starla Link.  History of Present Illness:   Ryan Cortez is a 87 year old male with history detailed above who does not follow regularly with Cardiology. He presented on this admission after being found minimally responsive and covered in dark stool. He was intubated on arrival and found to have Cr 9.3, K 7.2, Na 150, CK 1839. CT head showed right parietal subdural hemorrhage. He was given IVF, ABX and temporizing measures for hyperK. Deemed poor candidate for HD. TTE was obtained which showed LVEF 60-65%, normal RV, aortic sclerosis, and possible RA mass for which Cardiology has been consulted.  Patient is currently extubated and out of the ICU. Poor mental status with unclear baseline. Currently remains full code.   No past medical history on file.     Home Medications:  Prior to Admission medications   Medication Sig Start Date End Date Taking? Authorizing Provider  amLODipine (NORVASC) 10 MG tablet Take 10 mg by mouth daily.   Yes [provider]  finasteride (PROSCAR) 5 MG tablet Take 5 mg by mouth daily.   Yes [provider]  furosemide (LASIX) 40 MG tablet Take 40 mg by mouth daily.   Yes [provider]  losartan (COZAAR) 100 MG tablet Take 100 mg by mouth daily.   Yes [provider]  tamsulosin (FLOMAX) 0.4 MG CAPS capsule Take 0.4 mg by mouth daily.   Yes [provider]  dorzolamide (TRUSOPT) 2 % ophthalmic solution Place 1 drop into the left eye 2 (two) times daily. Patient not taking: Reported on 10/07/2021    [provider]   prednisoLONE acetate (PRED FORTE) 1 % ophthalmic suspension Place 1 drop into the right eye in the morning and at bedtime. Patient not taking: Reported on 10/07/2021    [provider]    Inpatient Medications: Scheduled Meds:  Chlorhexidine Gluconate Cloth  6 each Topical Daily   feeding supplement  237 mL Oral BID BM   finasteride  5 mg Oral Daily   insulin aspart  0-6 Units Subcutaneous Q4H   mouth rinse  15 mL Mouth Rinse BID   multivitamin  1 tablet Oral QHS   pantoprazole  40 mg Oral QHS   tamsulosin  0.4 mg Oral Daily   Continuous Infusions:  sodium chloride Stopped (10/07/21 2059)   dextrose 5 % and 0.45% NaCl 75 mL/hr at 10/12/21 0222   PRN Meds: sodium chloride, docusate, polyethylene glycol  Allergies:   No Known Allergies  Social History:   Social History   Socioeconomic History   Marital status: Divorced    Spouse name: Not on file   Number of children: Not on file   Years of education: Not on file   Highest education level: Not on file  Occupational History   Not on file  Tobacco Use   Smoking status: Not on file   Smokeless tobacco: Not on file  Substance and Sexual Activity   Alcohol use: Not on file   Drug use: Not on file   Sexual activity:  Not on file  Other Topics Concern   Not on file  Social History Narrative   Not on file   Social Determinants of Health   Financial Resource Strain: Not on file  Food Insecurity: Not on file  Transportation Needs: Not on file  Physical Activity: Not on file  Stress: Not on file  Social Connections: Not on file  Intimate Partner Violence: Not on file    Family History:   No family history on file.   ROS:  Unable to obtain due to mental status  Physical Exam/Data:   Vitals:   10/11/21 2030 10/11/21 2353 10/12/21 0434 10/12/21 0939  BP: (!) 118/54 (!) 114/53 115/66 (!) 118/58  Pulse: 74 72 70 (!) 51  Resp: 18 17 18 16   Temp: 97.7 F (36.5 C) 97.8 F (36.6 C) 97.9 F (36.6 C) 98.2 F  (36.8 C)  TempSrc: Oral Oral  Oral  SpO2: 100% 100% 100% 93%  Weight: 62.9 kg     Height:        Intake/Output Summary (Last 24 hours) at 10/12/2021 1506 Last data filed at 10/12/2021 0165 Gross per 24 hour  Intake 1621.36 ml  Output 550 ml  Net 1071.36 ml   Last 3 Weights 10/11/2021 10/11/2021 10/10/2021  Weight (lbs) 138 lb 10.7 oz 134 lb 0.6 oz 126 lb 5.2 oz  Weight (kg) 62.9 kg 60.8 kg 57.3 kg     Body mass index is 21.08 kg/m.  General:  Elderly, chronically ill appearing, very frail HEENT: normal Neck: no JVD Vascular: No carotid bruits Cardiac:  RR, 1/6 systolic murmur Lungs:  clear to auscultation bilaterally, no wheezing, rhonchi or rales  Abd: soft, nontender, no hepatomegaly  Ext: Bilatertal pitting edema in UE. No LE edema Musculoskeletal:  Muscle wasting Skin: warm and dry  Neuro:  Minimally responsive   EKG:  The EKG was personally reviewed and demonstrates:  Sinus with PVC, 1degree AVB Telemetry:  Telemetry was personally reviewed and demonstrates:  Sinus with frequent PVCs and NSVT  Relevant CV Studies: TTE 10/08/21: IMPRESSIONS   1. There is a mass in the RA (1.2 cm x 0.8 cm) that is best seen on  subcotal imaging. This could represent a very prominent eustachian valve,  but cannot exclude vegetation/thrombus. The patient does not have a  central line. It does not appear to be  attached to the tricuspid valve. If there are clinical concerns for  endocarditis would consider a TEE for better characterization.   2. Left ventricular ejection fraction, by estimation, is 60 to 65%. The  left ventricle has normal function. The left ventricle has no regional  wall motion abnormalities. Left ventricular diastolic parameters are  indeterminate.   3. Right ventricular systolic function is normal. The right ventricular  size is normal.   4. The mitral valve is grossly normal. Trivial mitral valve  regurgitation. No evidence of mitral stenosis.   5. The aortic  valve is tricuspid. There is moderate calcification of the  aortic valve. There is moderate thickening of the aortic valve. Aortic  valve regurgitation is not visualized. Aortic valve  sclerosis/calcification is present, without any evidence  of aortic stenosis.   Laboratory Data:  High Sensitivity Troponin:  No results for input(s): TROPONINIHS in the last 720 hours.   Chemistry Recent Labs  Lab 10/09/21 1631 10/10/21 0302 10/11/21 0417 10/12/21 0214  NA  --  143 142 141  K  --  4.6 4.4 4.3  CL  --  113*  113* 111  CO2  --  20* 20* 21*  GLUCOSE  --  129* 114* 111*  BUN  --  156* 139* 124*  CREATININE  --  5.16* 4.45* 3.93*  CALCIUM  --  6.3* 6.8* 7.1*  MG 2.3 2.1 2.2  --   GFRNONAA  --  10* 12* 14*  ANIONGAP  --  10 9 9     Recent Labs  Lab 10/07/21 1805 10/07/21 2259 10/11/21 0417  PROT 5.5*  --  4.5*  ALBUMIN 2.6* 2.4* 1.8*  AST 52*  --  38  ALT 61*  --  42  ALKPHOS 32*  --  27*  BILITOT 0.9  --  0.5   Lipids No results for input(s): CHOL, TRIG, HDL, LABVLDL, LDLCALC, CHOLHDL in the last 168 hours.  Hematology Recent Labs  Lab 10/09/21 0436 10/11/21 0417 10/12/21 0214  WBC 8.1 7.0 8.1  RBC 2.51* 2.58* 2.60*  HGB 7.4* 7.5* 7.5*  HCT 22.3* 23.6* 23.8*  MCV 88.8 91.5 91.5  MCH 29.5 29.1 28.8  MCHC 33.2 31.8 31.5  RDW 14.6 14.6 14.3  PLT 83* 90* 111*   Thyroid  Recent Labs  Lab 10/07/21 1905  TSH 5.686*  FREET4 0.66    BNPNo results for input(s): BNP, PROBNP in the last 168 hours.  DDimer No results for input(s): DDIMER in the last 168 hours.   Radiology/Studies:  ECHOCARDIOGRAM COMPLETE  Result Date: 10/08/2021    ECHOCARDIOGRAM REPORT   Patient Name:   MONTEZ CUDA Date of Exam: 10/08/2021 Medical Rec #:  [id number]    Height:       68.0 in Accession #:    [id number]   Weight:       111.6 lb Date of Birth:  Oct 29, 1927    BSA:          1.595 m Patient Age:    26 years     BP:           88/55 mmHg Patient Gender: M            HR:           95 bpm.  Exam Location:  Inpatient Procedure: 2D Echo, Cardiac Doppler and Color Doppler Indications:    Sepsis  History:        Patient has no prior history of Echocardiogram examinations.                 Arrythmias:Tachycardia; Signs/Symptoms:Hypotension. Respiratory                 failure.  Sonographer:    Merrie Roof RDCS Referring Phys: [id number] [place]  1. There is a mass in the RA (1.2 cm x 0.8 cm) that is best seen on subcotal imaging. This could represent a very prominent eustachian valve, but cannot exclude vegetation/thrombus. The patient does not have a central line. It does not appear to be attached to the tricuspid valve. If there are clinical concerns for endocarditis would consider a TEE for better characterization.  2. Left ventricular ejection fraction, by estimation, is 60 to 65%. The left ventricle has normal function. The left ventricle has no regional wall motion abnormalities. Left ventricular diastolic parameters are indeterminate.  3. Right ventricular systolic function is normal. The right ventricular size is normal.  4. The mitral valve is grossly normal. Trivial mitral valve regurgitation. No evidence of mitral stenosis.  5. The aortic valve is tricuspid. There is moderate calcification of the aortic valve. There  is moderate thickening of the aortic valve. Aortic valve regurgitation is not visualized. Aortic valve sclerosis/calcification is present, without any evidence of aortic stenosis. FINDINGS  Left Ventricle: Left ventricular ejection fraction, by estimation, is 60 to 65%. The left ventricle has normal function. The left ventricle has no regional wall motion abnormalities. The left ventricular internal cavity size was small. There is no left ventricular hypertrophy. Left ventricular diastolic parameters are indeterminate. Right Ventricle: The right ventricular size is normal. No increase in right ventricular wall thickness. Right ventricular systolic function is normal.  Left Atrium: Left atrial size was normal in size. Right Atrium: There is a mass in the RA (1.2 cm x 0.8 cm) that is best seen on subcotal imaging. This could represent a very prominent eustachian valve, but cannot exclude vegetation/thrombus. The patient does not have a central line. It does not appear to be attached to the tricuspid valve. If there are clinical concerns for endocarditis would consider a TEE for better characterization. Right atrial size was normal in size. Pericardium: Trivial pericardial effusion is present. Mitral Valve: The mitral valve is grossly normal. There is moderate thickening of the anterior and posterior mitral valve leaflet(s). Trivial mitral valve regurgitation. No evidence of mitral valve stenosis. Tricuspid Valve: The tricuspid valve is grossly normal. Tricuspid valve regurgitation is mild . No evidence of tricuspid stenosis. Aortic Valve: The aortic valve is tricuspid. There is moderate calcification of the aortic valve. There is moderate thickening of the aortic valve. Aortic valve regurgitation is not visualized. Aortic valve sclerosis/calcification is present, without any  evidence of aortic stenosis. Pulmonic Valve: The pulmonic valve was grossly normal. Pulmonic valve regurgitation is not visualized. No evidence of pulmonic stenosis. Aorta: The aortic root is normal in size and structure. Venous: The right lower pulmonary vein is normal. IVC assessment for right atrial pressure unable to be performed due to mechanical ventilation. IAS/Shunts: The atrial septum is grossly normal.  LEFT VENTRICLE PLAX 2D LVIDd:         2.90 cm   Diastology LVIDs:         2.10 cm   LV e' medial:    6.42 cm/s LV PW:         1.30 cm   LV E/e' medial:  11.5 LV IVS:        0.80 cm   LV e' lateral:   9.36 cm/s LVOT diam:     1.80 cm   LV E/e' lateral: 7.9 LV SV:         39 LV SV Index:   24 LVOT Area:     2.54 cm  IVC IVC diam: 2.00 cm LEFT ATRIUM           Index        RIGHT ATRIUM           Index  LA diam:      2.70 cm 1.69 cm/m   RA Area:     16.20 cm LA Vol (A4C): 50.8 ml 31.86 ml/m  RA Volume:   52.00 ml  32.61 ml/m  AORTIC VALVE LVOT Vmax:   99.20 cm/s LVOT Vmean:  60.500 cm/s LVOT VTI:    0.153 m  AORTA Ao Root diam: 2.60 cm MITRAL VALVE               TRICUSPID VALVE MV Area (PHT): 2.99 cm    TR Peak grad:   37.2 mmHg MV Decel Time: 254 msec    TR Vmax:  305.00 cm/s MV E velocity: 73.90 cm/s MV A velocity: 99.30 cm/s  SHUNTS MV E/A ratio:  0.74        Systemic VTI:  0.15 m                            Systemic Diam: 1.80 cm Eleonore Chiquito MD Electronically signed by Eleonore Chiquito MD Signature Date/Time: 10/08/2021/6:14:32 PM    Final      Assessment and Plan:   #RA Mass: Likely prominent, calcified eustachian valve. Blood cultures with one positive culture but likely a contaminant with no growth to date on repeat cultures. Patient is overall a poor candidate for [facility] due to subdural hemorrhage and if mass was a thrombus, would not affect management as patient cannot tolerate blood thinner. No plan for TEE at this time and likely will not pursue in the future given overall poor prognosis. -No plan for TEE; suspect mass is prominent eustachian valve that is calcified and patient is overall too frail to proceed with invasive procedures at this time -Agree with palliative evaluation  #PVCs: #NSVT Noted on telemetry. Likely multifactorial in the setting of acute illness, elderly age and renal dysfunction. Recommend conservative management. -Start low dose metop 12.5mg  BID -No plans for ischemic work-up -Recommend palliative evaluation as above  #Subdural Hematoma: -Conservative management   #Acute respiratory failure: Now extubated. On 2L [place]. -Management per primary  #AKI on CKDIV: Cr 9 on admission with K 7. Deemed poor candidate for HD.  -Continue conservative management -Poor HD candidate  Cardiology will sign-off. Please feel free to call with questions or  concerns.   Risk Assessment/Risk Scores:        For questions or updates, please contact [place] Please consult [email or web address] for contact info under    Signed, Freada Bergeron, MD  10/12/2021 3:06 PM

## 2021-10-12 NOTE — Evaluation (Signed)
Physical Therapy Evaluation Patient Details Name: Ryan Cortez MRN: [id number] DOB: [birth year] Today's Date: 10/12/2021  History of Present Illness  87 y.o. male presents to [facility] hospital on 10/07/2021 after being found down at home, minimally responsive, covered in dark stool. CT head with old hygroma and 30mm acute vs subacute subdural. Pt intubated upon arrival, extubated 10/10/2021. PMH includes HTN, HL, CKD, DM.  Clinical Impression  Pt presents to PT with deficits in cognition, strength, power, functional mobility, balance, endurance. Pt currently requires significant physical assistance to perform all functional mobility tasks and is unable to maintain sitting balance without back support. Pt fatigues quickly at this time and demonstrates limited activity tolerance. Pt will benefit from continued acute PT services to reduce falls risk and caregiver burden. PT recommends SNF placement at this time.     Recommendations for follow up therapy are one component of a multi-disciplinary discharge planning process, led by the attending physician.  Recommendations may be updated based on patient status, additional functional criteria and insurance authorization.  Follow Up Recommendations Skilled nursing-short term rehab (<3 hours/day)    Assistance Recommended at Discharge Frequent or constant Supervision/Assistance  Functional Status Assessment Patient has had a recent decline in their functional status and demonstrates the ability to make significant improvements in function in a reasonable and predictable amount of time.  Equipment Recommendations  Wheelchair (measurements PT);Wheelchair cushion (measurements PT);Hospital bed (hoyer lift)    Recommendations for Other Services       Precautions / Restrictions Precautions Precautions: Fall Restrictions Weight Bearing Restrictions: No      Mobility  Bed Mobility Overal bed mobility: Needs Assistance Bed Mobility: Supine to Sit;Sit to  Supine     Supine to sit: Max assist;HOB elevated Sit to supine: Total assist;HOB elevated        Transfers Overall transfer level:  (pt declines due to fatigue)                      Ambulation/Gait                  Stairs            Wheelchair Mobility    Modified Rankin (Stroke Patients Only)       Balance Overall balance assessment: Needs assistance Sitting-balance support: Bilateral upper extremity supported;Feet supported Sitting balance-Leahy Scale: Poor Sitting balance - Comments: minA due to posterior lean Postural control: Posterior lean                                   Pertinent Vitals/Pain Pain Assessment: Faces Faces Pain Scale: Hurts a little bit Pain Location: generalized Pain Descriptors / Indicators: Grimacing Pain Intervention(s): Monitored during session    Home Living Family/patient expects to be discharged to:: Private residence Living Arrangements: Alone Available Help at Discharge: Family;Available PRN/intermittently Type of Home: Apartment Home Access: Level entry       Home Layout: One level Home Equipment: Cane - single point Additional Comments: will need to confirm history with family as pt is cognitively impaired    Prior Function Prior Level of Function : Independent/Modified Independent;Driving             Mobility Comments: independent and driving per family report documented in MD note       Hand Dominance        Extremity/Trunk Assessment   Upper Extremity Assessment Upper Extremity Assessment: Generalized weakness  Lower Extremity Assessment Lower Extremity Assessment: Generalized weakness    Cervical / Trunk Assessment Cervical / Trunk Assessment: Kyphotic  Communication   Communication: HOH  Cognition Arousal/Alertness: Awake/alert Behavior During Therapy: WFL for tasks assessed/performed Overall Cognitive Status: Difficult to assess (hearing deficits make for  difficult assessment)                                 General Comments: pt is oriented to person and place, reports the month is november, less oriented to situation.        General Comments General comments (skin integrity, edema, etc.): pt on 2L [place], VSS during session    Exercises     Assessment/Plan    PT Assessment Patient needs continued PT services  PT Problem List Decreased strength;Decreased activity tolerance;Decreased balance;Decreased mobility;Decreased cognition;Decreased knowledge of use of DME       PT Treatment Interventions DME instruction;Gait training;Functional mobility training;Therapeutic activities;Therapeutic exercise;Balance training;Neuromuscular re-education;Patient/family education;Wheelchair mobility training;Cognitive remediation    PT Goals (Current goals can be found in the Care Plan section)  Acute Rehab PT Goals Patient Stated Goal: to improve strength PT Goal Formulation: With patient Time For Goal Achievement: 10/26/21 Potential to Achieve Goals: Fair    Frequency Min 2X/week   Barriers to discharge        Co-evaluation               AM-PAC PT "6 Clicks" Mobility  Outcome Measure Help needed turning from your back to your side while in a flat bed without using bedrails?: Total Help needed moving from lying on your back to sitting on the side of a flat bed without using bedrails?: Total Help needed moving to and from a bed to a chair (including a wheelchair)?: Total Help needed standing up from a chair using your arms (e.g., wheelchair or bedside chair)?: Total Help needed to walk in hospital room?: Total Help needed climbing 3-5 steps with a railing? : Total 6 Click Score: 6    End of Session Equipment Utilized During Treatment: Oxygen Activity Tolerance: Patient limited by fatigue Patient left: in bed;with call bell/phone within reach;with bed alarm set Nurse Communication: Mobility status PT Visit Diagnosis:  Other abnormalities of gait and mobility (R26.89);Muscle weakness (generalized) (M62.81)    Time: 8016-5537 PT Time Calculation (min) (ACUTE ONLY): 21 min   Charges:   PT Evaluation $PT Eval Low Complexity: [place], PT, DPT Acute Rehabilitation Pager: [phone] Office [phone]   Zenaida Niece 10/12/2021, 12:08 PM

## 2021-10-12 NOTE — Evaluation (Signed)
Occupational Therapy Evaluation Patient Details Name: Ryan Cortez MRN: [id number] DOB: 1927-02-10 Today's Date: 10/12/2021   History of Present Illness 87 y.o. male presents to [facility] hospital on 10/07/2021 after being found down at home, minimally responsive, covered in dark stool. 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 Pt intubated upon arrival, extubated 10/10/2021. PMH includes HTN, HL, CKD, DM.   Clinical Impression   Per report, pt was indep/mod I PTA. He lives alone in a level entry apartment. Upon evaluation, pt is now limited by impaired cognition, generalized weakness, poor balance and pooractivity tolerance. Session spent at bed level for hygiene after BM on bed pan. Ultimately, pt required max A for rolling and and LB ADLs, mod A for upper ADLs and max-total A for supine>sit with poor sitting balance. He benefits from increased cues and assistance for initiation of all tasks. He will benefit from OT acutely. Recommend d/c to SNF.      Recommendations for follow up therapy are one component of a multi-disciplinary discharge planning process, led by the attending physician.  Recommendations may be updated based on patient status, additional functional criteria and insurance authorization.   Follow Up Recommendations  Skilled nursing-short term rehab (<3 hours/day)    Assistance Recommended at Discharge Frequent or constant Supervision/Assistance  Functional Status Assessment  Patient has had a recent decline in their functional status and/or demonstrates limited ability to make significant improvements in function in a reasonable and predictable amount of time  Equipment Recommendations  Other (comment) (defer to next venue of care)    Recommendations for Other Services       Precautions / Restrictions Precautions Precautions: Fall Restrictions Weight Bearing Restrictions: No      Mobility Bed Mobility Overal bed mobility: Needs Assistance Bed Mobility:  Rolling;Supine to Sit;Sit to Supine Rolling: Max assist   Supine to sit: Max assist;HOB elevated Sit to supine: Total assist;HOB elevated   General bed mobility comments: +2 for rolling during hygiene this session.    Transfers Overall transfer level:  (pt declines due to fatigue)                 General transfer comment: defer for safety      Balance Overall balance assessment: Needs assistance Sitting-balance support: Bilateral upper extremity supported;Feet supported Sitting balance-Leahy Scale: Poor Sitting balance - Comments: returned to cupine due to poor sitting balance Postural control: Posterior lean                                 ADL either performed or assessed with clinical judgement   ADL Overall ADL's : Needs assistance/impaired Eating/Feeding: Set up;Sitting   Grooming: Supervision/safety;Set up;Sitting   Upper Body Bathing: Moderate assistance;Sitting   Lower Body Bathing: Maximal assistance;Bed level   Upper Body Dressing : Moderate assistance;Sitting   Lower Body Dressing: Maximal assistance;Bed level   Toilet Transfer: Maximal assistance Toilet Transfer Details (indicate cue type and reason): bed level - bed pan for BM this session Toileting- Clothing Manipulation and Hygiene: Maximal assistance;Bed level       Functional mobility during ADLs: Maximal assistance (bed level) General ADL Comments: pt limited by [facility], generalized weakness, poor problem solving and initiation, impaired balance and activity tolerance     Vision Baseline Vision/History: 0 No visual deficits Ability to See in Adequate Light: 0 Adequate Patient Visual Report: No change from baseline Vision Assessment?: No apparent visual deficits  Perception     Praxis      Pertinent Vitals/Pain Pain Assessment: Faces Faces Pain Scale: Hurts a little bit Pain Location: generalized Pain Descriptors / Indicators: Grimacing Pain Intervention(s): Monitored  during session     Hand Dominance     Extremity/Trunk Assessment Upper Extremity Assessment Upper Extremity Assessment: Generalized weakness   Lower Extremity Assessment Lower Extremity Assessment: Generalized weakness   Cervical / Trunk Assessment Cervical / Trunk Assessment: Kyphotic   Communication Communication Communication: HOH   Cognition Arousal/Alertness: Awake/alert Behavior During Therapy: WFL for tasks assessed/performed Overall Cognitive Status: Difficult to assess                                 General Comments: pt is oriented to person and place, reports the month is november, less oriented to situation. required incrsaed cues for sequencing and initiation     General Comments  pt on 2L [place], pt with severl facial abrasions. BM this session, RN and NT aware    Exercises     Shoulder Instructions      Home Living Family/patient expects to be discharged to:: Private residence Living Arrangements: Alone Available Help at Discharge: Family;Available PRN/intermittently Type of Home: Apartment Home Access: Level entry     Home Layout: One level               Home Equipment: Cane - single point   Additional Comments: will need to confirm history with family as pt is cognitively impaired      Prior Functioning/Environment Prior Level of Function : Independent/Modified Independent;Driving             Mobility Comments: independent and driving per family report documented in MD note ADLs Comments: indep per chart        OT Problem List: Decreased strength;Decreased range of motion;Decreased activity tolerance;Impaired balance (sitting and/or standing);Decreased safety awareness;Decreased knowledge of use of DME or AE;Decreased knowledge of precautions;Pain      OT Treatment/Interventions: Self-care/ADL training;Therapeutic exercise;Balance training;Patient/family education;Therapeutic activities;DME and/or AE instruction    OT  Goals(Current goals can be found in the care plan section) Acute Rehab OT Goals Patient Stated Goal: did not state OT Goal Formulation: With patient Time For Goal Achievement: 10/26/21 Potential to Achieve Goals: Fair ADL Goals Pt Will Perform Grooming: with supervision;sitting Pt Will Perform Lower Body Bathing: with min guard assist;sit to/from stand Pt Will Perform Lower Body Dressing: with min guard assist;sit to/from stand Pt Will Transfer to Toilet: with min guard assist;stand pivot transfer;bedside commode Pt/caregiver will Perform Home Exercise Program: Increased ROM;Increased strength;Both right and left upper extremity;With written HEP provided  OT Frequency: Min 2X/week   Barriers to D/C: Decreased caregiver support  pt lives alone       Co-evaluation              AM-PAC OT "6 Clicks" Daily Activity     Outcome Measure Help from another person eating meals?: A Little Help from another person taking care of personal grooming?: A Little Help from another person toileting, which includes using toliet, bedpan, or urinal?: A Lot Help from another person bathing (including washing, rinsing, drying)?: A Lot Help from another person to put on and taking off regular upper body clothing?: A Lot Help from another person to put on and taking off regular lower body clothing?: A Lot 6 Click Score: 14   End of Session Equipment Utilized During Treatment: Oxygen  Nurse Communication: Mobility status  Activity Tolerance: Patient tolerated treatment well Patient left: in bed;with call bell/phone within reach;with bed alarm set;with nursing/sitter in room  OT Visit Diagnosis: Unsteadiness on feet (R26.81);Other abnormalities of gait and mobility (R26.89);Muscle weakness (generalized) (M62.81);History of falling (Z91.81);Pain;Adult, failure to thrive (R62.7)                Time: 7414-2395 OT Time Calculation (min): 17 min Charges:  OT General Charges $OT Visit: 1 Visit OT  Evaluation $OT Eval Moderate Complexity: 1 Mod   Talitha Dicarlo A Avriana Joo 10/12/2021, 3:10 PM

## 2021-10-12 NOTE — Plan of Care (Signed)

## 2021-10-12 NOTE — Consult Note (Signed)
Palliative Medicine Inpatient Consult Note  Consulting Provider: Aline August, MD  Reason for consult:   [facility] Consult  Reason for Consult? goals of care   HPI:  Per intake H&P --> 87 year old male with history of hypertension, hyperlipidemia, diabetes mellitus type 2, CKD stage IV was found in his home minimally responsive covered in dark stool.  Patient was intubated on arrival; he was found to have creatinine of 9.3, potassium of 7.2, sodium 150 and CK18 100.  Chest x-ray without significant infiltrate.  He was started on IV fluids, broad-spectrum antibiotics and admitted to ICU under PCCM service.  Nephrology was consulted.  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  Neurosurgery recommended conservative management.  He was extubated on 10/10/2021.  2D echo showed right atrial mass.  He was transferred to [facility] service from 10/12/2021 onwards.  Palliative care has been consulted in the setting of chronic comorbid conditions to further discuss goals of care.  Clinical Assessment/Goals of Care:  *Please note that this is a verbal dictation therefore any spelling or grammatical errors are due to the "[place] [facility]" system interpretation.  I have reviewed medical records including EPIC notes, labs and imaging, received report from bedside RN, assessed the patient who is lying in bed in no acute distress.    I met with Ryan Cortez and his significant other, Mardene Celeste and daughter, [place] to further discuss diagnosis prognosis, [place], EOL wishes, disposition and options.   I introduced Palliative Medicine as specialized medical care for people living with serious illness. It focuses on providing relief from the symptoms and stress of a serious illness. The goal is to improve quality of life for both the patient and the family.  Pedro is from  [place] originally.  Andersson was married though his wife passed away within the last 2 years.  He has a longtime friend Mardene Celeste who he used to date and remains in his life.  He considers Patricia's daughter [place] to be his daughter as well as he helps raise her.  He was in the [employer] for 8 years.  His career was at [employer].  He also worked in Holiday representative.  He is a man who thrives in regard to his independence.  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  Prior to hospitalization Luisdaniel had been living alone in an apartment.  Per Mardene Celeste he was very resistant to using any aids to help walk though he had very unsteady gait and was noted to shuffle.  He was still driving prior to hospitalization though Mardene Celeste expresses that was very unsafe.  When these things were brought to Khylon's attention he was angry and resistant towards additional methods of support.  From a nutritional perspective Mardene Celeste shares that it is clear he was not eating sufficiently based upon how he looks presently.  From a sensory perspective Locklan is exceptionally hard of hearing.  Mardene Celeste shares that his insurance will not pay for hearing aids.  He had bilateral cataracts which he is corrected via LASEK surgery.  A careful review of Eidan's hospital admission and acute on chronic diagnoses was completed with Chalmers Guest through chart review.  We discussed his hypoxemic respiratory failure requiring intubation upon admission.  We discussed his altered mental state in the setting of delirium.  We  discussed his right subdural hematoma, his severe kidney injury on admission which is improving.  We reviewed his right atrium mass and the idea of getting a TEE though at this time it does not appear safe to proceed with that.  We discussed Zaeden's very poor physical condition his malnutrition as well as his muscular wasting.  A comprehensive review of  hospital induced delirium was held.   A detailed discussion was had today regarding advanced directives, Dallon does not have any on file though he clearly shares he would wish for Mardene Celeste to be his primary Media planner.  We will complete advanced directives while Anush is hospitalized.   Concepts specific to code status, artifical feeding and hydration, continued IV antibiotics and rehospitalization was had.  I shared with Kenner given his advanced age and chronic disease processes that I worry if he endured a true cardiopulmonary resuscitation event we would cause his body quite a lot of trauma.  I shared my concern that he would never be the same independent man after such an event as he was before I highlighted how difficult it will be for him to recover from this hospital stay. Provided  "Hard Choices for Loving People" booklet and a MOST form for review.  We spent a great deal of time focusing on [place] situation and the reality that him living at home independently is likely no longer safe.  Mardene Celeste states that he has already confided in her to look for an assisted living facility.  Discussed the importance of continued conversation with family and their  medical providers regarding overall plan of care and treatment options, ensuring decisions are within the context of the patients values and GOCs.  Decision Maker: Lorraine Lax [phone]) [phone] --> This is Kory designates to be his primary decision maker  SUMMARY OF RECOMMENDATIONS   *Please note patient is exceptionally hard of hearing he would benefit from a sound amplifying device when speaking to him  Full code for the time being we plan to meet tomorrow for further conversations, provided literature regarding CODE STATUS for review  Plan to complete MOST form prior to discharge  Patient is no longer safe to live alone nor is he safe to be driving he will have to be reported to the [facility]  PT recommend skilled  nursing placement for Pamela which is a good neck step for him to optimize his strength  Appreciate social work helping to identify assisted living options   [facility] consult for outpatient palliative support upon discharge  Appreciate chaplain helping to complete advanced directives  Ongoing PMT support  Code Status/Advance Care Planning: FULL CODE   Palliative Prophylaxis:  Oral Care, Mobility, Delirium precautions  Additional Recommendations (Limitations, Scope, Preferences): Full Scope for the time being - patient and his friend are discussing this further   Psycho-social/Spiritual:  Desire for further Chaplaincy support: Yes Additional Recommendations: Comprehensive education in regard to acute on chronic disease processes   Prognosis: Has had a complicated hospital stay inclusive of intubation, has multiple chronic comorbidities inclusive of Stg IV kidney disease and is not an HD candidate, has advanced frailty. Very high 12 month mortality risk.   Discharge Planning: Will DC to SNF with OP Palliative support when optimized.  Oral Intake %:  75% I/O:  Poor I/O Bowel Movements:  Last 12/1 Mobility:  PT/OT rec short term rehab  Vitals:   10/12/21 0434 10/12/21 0939  BP: 115/66 (!) 118/58  Pulse: 70 (!) 51  Resp: 18 16  Temp: 97.9 F (36.6 C) 98.2 F (36.8 C)  SpO2: 100% 93%    Intake/Output Summary (Last 24 hours) at 10/12/2021 1621 Last data filed at 10/12/2021 6767 Gross per 24 hour  Intake 1621.36 ml  Output 550 ml  Net 1071.36 ml   Last Weight  Most recent update: 10/11/2021  8:32 PM    Weight  62.9 kg (138 lb 10.7 oz)            Gen:  [place] Elderly AA M in NAD HEENT: moist mucous membranes CV: Regular rate and rhythm PULM: On 2LPM ABD: soft/nontender/nondistended EXT: No edema, (+) muscle wasting throughout Neuro:  Alert, hard of hearing  PPS: 40%   This conversation/these recommendations were discussed with patient primary care team, Dr.  Starla Link  Time In: 1630 Time Out: 1755 Total Time: 85 Greater than 50%  of this time was spent counseling and coordinating care related to the above assessment and plan.  [place] Team Team Cell Phone: [phone] Please utilize secure chat with additional questions, if there is no response within 30 minutes please call the above phone number  Palliative Medicine Team providers are available by phone from 7am to 7pm daily and can be reached through the team cell phone.  Should this patient require assistance outside of these hours, please call the patient's attending physician.

## 2021-10-13 LAB — CBC WITH DIFFERENTIAL/PLATELET
Abs Immature Granulocytes: 0 10*3/uL (ref 0.00–0.07)
Basophils Absolute: 0 10*3/uL (ref 0.0–0.1)
Basophils Relative: 0 %
Eosinophils Absolute: 0 10*3/uL (ref 0.0–0.5)
Eosinophils Relative: 0 %
HCT: 24 % — ABNORMAL LOW (ref 39.0–52.0)
Hemoglobin: 7.6 g/dL — ABNORMAL LOW (ref 13.0–17.0)
Lymphocytes Relative: 9 %
Lymphs Abs: 0.8 10*3/uL (ref 0.7–4.0)
MCH: 28.9 pg (ref 26.0–34.0)
MCHC: 31.7 g/dL (ref 30.0–36.0)
MCV: 91.3 fL (ref 80.0–100.0)
Monocytes Absolute: 0.2 10*3/uL (ref 0.1–1.0)
Monocytes Relative: 2 %
Neutro Abs: 8 10*3/uL — ABNORMAL HIGH (ref 1.7–7.7)
Neutrophils Relative %: 89 %
Platelets: 131 10*3/uL — ABNORMAL LOW (ref 150–400)
RBC: 2.63 MIL/uL — ABNORMAL LOW (ref 4.22–5.81)
RDW: 13.8 % (ref 11.5–15.5)
WBC: 9 10*3/uL (ref 4.0–10.5)
nRBC: 0 % (ref 0.0–0.2)
nRBC: 0 /100 WBC

## 2021-10-13 LAB — COMPREHENSIVE METABOLIC PANEL
ALT: 48 U/L — ABNORMAL HIGH (ref 0–44)
AST: 48 U/L — ABNORMAL HIGH (ref 15–41)
Albumin: 1.9 g/dL — ABNORMAL LOW (ref 3.5–5.0)
Alkaline Phosphatase: 37 U/L — ABNORMAL LOW (ref 38–126)
Anion gap: 7 (ref 5–15)
BUN: 99 mg/dL — ABNORMAL HIGH (ref 8–23)
CO2: 21 mmol/L — ABNORMAL LOW (ref 22–32)
Calcium: 7.4 mg/dL — ABNORMAL LOW (ref 8.9–10.3)
Chloride: 111 mmol/L (ref 98–111)
Creatinine, Ser: 3.33 mg/dL — ABNORMAL HIGH (ref 0.61–1.24)
GFR, Estimated: 17 mL/min — ABNORMAL LOW (ref >60)
Glucose, Bld: 108 mg/dL — ABNORMAL HIGH (ref 70–99)
Potassium: 4.3 mmol/L (ref 3.5–5.1)
Sodium: 139 mmol/L (ref 135–145)
Total Bilirubin: 0.6 mg/dL (ref 0.3–1.2)
Total Protein: 4.9 g/dL — ABNORMAL LOW (ref 6.5–8.1)

## 2021-10-13 LAB — GLUCOSE, CAPILLARY
Glucose-Capillary: 107 mg/dL — ABNORMAL HIGH (ref 70–99)
Glucose-Capillary: 108 mg/dL — ABNORMAL HIGH (ref 70–99)
Glucose-Capillary: 127 mg/dL — ABNORMAL HIGH (ref 70–99)
Glucose-Capillary: 93 mg/dL (ref 70–99)
Glucose-Capillary: 97 mg/dL (ref 70–99)

## 2021-10-13 LAB — MAGNESIUM: Magnesium: 2 mg/dL (ref 1.7–2.4)

## 2021-10-13 NOTE — NC FL2 (Addendum)
[place] LEVEL OF CARE SCREENING TOOL     IDENTIFICATION  Patient Name: Ryan Cortez Birthdate: 11-17-1926 Sex: male Admission Date (Current Location): 10/07/2021  [facility] [place] and [place] Number:  Herbalist and Address:  The [place]. [facility], [place] [address], [place], [place] [address]      Provider Number: [id number]  Attending Physician Name and Address:  Aline August, MD  Relative Name and Phone Number:  Charolotte Eke Daughter   [phone]    Current Level of Care: Hospital Recommended Level of Care: [place] Prior Approval Number:    Date Approved/Denied:   PASRR Number:  [id number]  Discharge Plan: SNF    Current Diagnoses: Patient Active Problem List   Diagnosis Date Noted   Protein-calorie malnutrition, severe 10/11/2021   Pressure injury of skin 10/08/2021   AKI (acute kidney injury) ([place]) 10/07/2021   Subdural hematoma    Hyperkalemia    Hypotension    Acute respiratory failure with hypoxia (HCC)     Orientation RESPIRATION BLADDER Height & Weight     Self, Place  Normal Incontinent, Indwelling catheter Weight: 138 lb 10.7 oz (62.9 kg) Height:  5\' 8"  (172.7 cm)  BEHAVIORAL SYMPTOMS/MOOD NEUROLOGICAL BOWEL NUTRITION STATUS      Incontinent Diet (see discharge summary)  AMBULATORY STATUS COMMUNICATION OF NEEDS Skin   Total Care Verbally Skin abrasions                       Personal Care Assistance Level of Assistance  Bathing, Feeding, Dressing Bathing Assistance: Maximum assistance Feeding assistance: Limited assistance Dressing Assistance: Maximum assistance     Functional Limitations Info  Sight, Hearing, Speech Sight Info: Adequate Hearing Info: Impaired Speech Info: Impaired    SPECIAL CARE FACTORS FREQUENCY  PT (By licensed PT), OT (By licensed OT)     PT Frequency: 5x week OT Frequency: 5x week            Contractures Contractures Info: Not present     Additional Factors Info  Code Status, Allergies, Insulin Sliding Scale Code Status Info: full Allergies Info: NKA   Insulin Sliding Scale Info: Novolog, 0-6 units q4 hours, see discharge summary       Current Medications (10/13/2021):  This is the current hospital active medication list Current Facility-Administered Medications  Medication Dose Route Frequency Provider Last Rate Last Admin   0.9 %  sodium chloride infusion    Continuous PRN Idamae Lusher, MD   Stopped at 10/07/21 2059   Chlorhexidine Gluconate Cloth 2 % PADS 6 each  6 each Topical Daily Collier Bullock, MD   6 each at 10/12/21 1401   dextrose 5 %-0.45 % sodium chloride infusion   Intravenous Continuous Mauri Brooklyn, MD 75 mL/hr at 10/13/21 0649 New Bag at 10/13/21 0649   docusate (COLACE) 50 MG/5ML liquid 100 mg  100 mg Oral BID PRN Maryjane Hurter, MD       feeding supplement (ENSURE ENLIVE / ENSURE PLUS) liquid 237 mL  237 mL Oral BID BM Maryjane Hurter, MD   237 mL at 10/13/21 0853   finasteride (PROSCAR) tablet 5 mg  5 mg Oral Daily Gerald Leitz D, NP   5 mg at 10/13/21 5465   insulin aspart (novoLOG) injection 0-6 Units  0-6 Units Subcutaneous Q4H Gleason, Otilio Carpen, PA-C       MEDLINE mouth rinse  15 mL Mouth Rinse BID Hunsucker, Bonna Gains, MD   15 mL  at 10/12/21 2123   metoprolol tartrate (LOPRESSOR) tablet 12.5 mg  12.5 mg Oral BID Freada Bergeron, MD   12.5 mg at 10/13/21 1610   multivitamin (RENA-VIT) tablet 1 tablet  1 tablet Oral QHS Maryjane Hurter, MD   1 tablet at 10/12/21 2122   pantoprazole (PROTONIX) EC tablet 40 mg  40 mg Oral QHS Gerald Leitz D, NP   40 mg at 10/12/21 2122   polyethylene glycol (MIRALAX / GLYCOLAX) packet 17 g  17 g Oral Daily PRN Maryjane Hurter, MD       tamsulosin [facility]) capsule 0.4 mg  0.4 mg Oral Daily Gerald Leitz D, NP   0.4 mg at 10/13/21 9604     Discharge Medications: Please see discharge summary for a list of discharge  medications.  Relevant Imaging Results:  Relevant Lab Results:   Additional Information SSN: [id number]. Pfizer vaccines 12/17/19, 01/07/20  Joanne Chars, LCSW

## 2021-10-13 NOTE — Progress Notes (Signed)
Dr. Acie Fredrickson from Cardiology made aware about patient's abnormal heart rhythms. Awaiting cardio recs/input.

## 2021-10-13 NOTE — TOC Initial Note (Signed)
Transition of Care [facility]) - Initial/Assessment Note    Patient Details  Name: Ryan Cortez MRN: [id number] Date of Birth: Apr 20, 1927  Transition of Care [facility]) CM/SW Contact:    Joanne Chars, LCSW Phone Number: 10/13/2021, 3:41 PM  Clinical Narrative:   CSW attempted  meet with pt who was unable to participate in assessment.  LM with pt daughter [place].  1400: TC from [place], coming to hospital. 1530: CSW was just starting assessment with daughter [place] and significant other Mardene Celeste when [place] from palliative arrived.  They would like to complete the palliative conversation before making disposition decision.  They do have choice document.  CSW informed them that pt SSN is needed, Mardene Celeste has this number at home.  FL2 completed.  NEED SSN. Pt not sent out in hub.            Expected Discharge Plan: Skilled Nursing Facility Barriers to Discharge: Other (must enter comment), Continued Medical Work up (pending palliative consult)   Patient Goals and CMS Choice   CMS Medicare.gov Compare Post Acute Care list provided to:: Patient Represenative (must comment) Choice offered to / list presented to : Adult Children  Expected Discharge Plan and Services Expected Discharge Plan: [place] In-house Referral: Clinical Social Work   Post Acute Care Choice:  (TBD) Living arrangements for the past 2 months: Apartment                                      Prior Living Arrangements/Services Living arrangements for the past 2 months: Apartment Lives with:: Self Patient language and need for interpreter reviewed:: No        Need for Family Participation in Patient Care: Yes (Comment) Care giver support system in place?: Yes (comment)   Criminal Activity/Legal Involvement Pertinent to Current Situation/Hospitalization: No - Comment as needed  Activities of Daily Living   ADL Screening (condition at time of admission) Is the patient deaf or have  difficulty hearing?: Yes Weakness of Legs: Both Weakness of Arms/Hands: Both  Permission Sought/Granted                  Emotional Assessment Appearance:: Appears stated age Attitude/Demeanor/Rapport: Unable to Assess Affect (typically observed): Unable to Assess Orientation: : Oriented to Self Alcohol / Substance Use: Not Applicable Psych Involvement: No (comment)  Admission diagnosis:  Fall [W19.XXXA] AKI (acute kidney injury) [facility]) [N17.9] Patient Active Problem List   Diagnosis Date Noted   Protein-calorie malnutrition, severe 10/11/2021   Pressure injury of skin 10/08/2021   AKI (acute kidney injury) ([place]) 10/07/2021   Subdural hematoma    Hyperkalemia    Hypotension    Acute respiratory failure with hypoxia ([place])    PCP:  Horald Pollen, MD Pharmacy:  No Pharmacies Listed    Social Determinants of Health (SDOH) Interventions    Readmission Risk Interventions No flowsheet data found.

## 2021-10-13 NOTE — Progress Notes (Signed)
Palliative Medicine Inpatient Follow Up Note  Consulting Provider: Aline August, MD   Reason for consult:   [place] Palliative Medicine Consult  Reason for Consult? goals of care    HPI:  Per intake H&P --> 87 year old male with history of hypertension, hyperlipidemia, diabetes mellitus type 2, CKD stage IV was found in his home minimally responsive covered in dark stool.  Patient was intubated on arrival; he was found to have creatinine of 9.3, potassium of 7.2, sodium 150 and CK18 100.  Chest x-ray without significant infiltrate.  He was started on IV fluids, broad-spectrum antibiotics and admitted to ICU under PCCM service.  Nephrology was consulted.  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  Neurosurgery recommended conservative management.  He was extubated on 10/10/2021.  2D echo showed right atrial mass.  He was transferred to [facility] service from 10/12/2021 onwards.   Palliative care has been consulted in the setting of chronic comorbid conditions to further discuss goals of care.  Today's Discussion (10/13/2021):  *Please note that this is a verbal dictation therefore any spelling or grammatical errors are due to the "[place]" system interpretation.  Chart reviewed.   I met with Stann Ore, and Levada Dy at bedside. I utilized a sound amplifying application "hear Boost" in the hopes that he would be able to hear me better. He unfortunately was quite delirious this early evening. He could not remember where we were or why we were here. We was however consistent recognizing Mardene Celeste and continued to share his desire for her to be his primary Media planner.  Belva Agee and I discussed Dawn's clinical conditions. I shared with them my concern that he may likely not recover from this hospitalization. We reviewed that if he is unable to  participate in a comprehensive code status discussion moving forward that the decision would come to them. This is based on them having an established relationship with the patient and acting in good faith as there are no other family members who would be able to make decisions for him.   We reviewed the importance on reviewing what will and will not be possible in the long term in regard to patients estate. We discussed the importance on considering what he can afford from the perspective of an ALF. Discussed meeting with an Elder Care attorney for further conversations.   Questions and concerns addressed   Objective Assessment: Vital Signs Vitals:   10/13/21 0845 10/13/21 1234  BP: 130/85 117/69  Pulse: 93 93  Resp: 17 17  Temp: (!) 97.5 F (36.4 C)   SpO2: 99%     Intake/Output Summary (Last 24 hours) at 10/13/2021 1700 Last data filed at 10/13/2021 0800 Gross per 24 hour  Intake 1305 ml  Output 1650 ml  Net -345 ml   Last Weight  Most recent update: 10/11/2021  8:32 PM    Weight  62.9 kg (138 lb 10.7 oz)            Gen:  Frail Elderly AA M in NAD HEENT: moist mucous membranes CV: Regular rate and rhythm PULM: On 2LPM ABD: soft/nontender/nondistended EXT: No edema, (+) muscle wasting throughout Neuro:  Alert, hard of hearing  SUMMARY OF RECOMMENDATIONS   *Please note patient is exceptionally hard of hearing he would benefit from a sound amplifying device when speaking to him  Full code for the time being we plan to meet tomorrow for further conversations, provided literature regarding CODE STATUS for review   Plan to complete MOST form prior to discharge   Patient is no longer safe to live alone nor is he safe to be driving he will have to be reported to the [facility]   PT recommend skilled nursing placement for [place] which is a good neck step for him to optimize his strength   Appreciate social work helping to identify assisted living options    [facility] consult for  outpatient palliative support upon discharge   Appreciate chaplain helping to complete advanced directives --> HCPOA documents are on the front of the chart   Ongoing PMT support  Time Spent: 60 Greater than 50% of the time was spent in counseling and coordination of care ______________________________________________________________________________________ [place] Team Team Cell Phone: [phone] Please utilize secure chat with additional questions, if there is no response within 30 minutes please call the above phone number  Palliative Medicine Team providers are available by phone from 7am to 7pm daily and can be reached through the team cell phone.  Should this patient require assistance outside of these hours, please call the patient's attending physician.

## 2021-10-13 NOTE — Progress Notes (Signed)
   I received a call from Eduard Clos, RN for persistent salvos of NSVT  The patient was admitted with multiple medical problems  He is essentially unresponsive, frail Malnurished.  Has acute on chronic CKD and is not a dialysis candidate  He has normal LV function 1st degree AV block  LFT abnormalities  We are very limited in what we would be able to give him  We have recommneded palliative care / comfort care and have signed offl.    Mertie Moores, MD  10/13/2021 1:04 PM    [address] [place],  [place], [place]  [address] Phone: [phone]; Fax: [phone]

## 2021-10-13 NOTE — Progress Notes (Signed)
Patient ID: Ryan Cortez, male   DOB: [birth year], 87 y.o.   MRN: [id number]  PROGRESS NOTE    Perley Arthurs  RCV:[id number] DOB: 01-12-1927 DOA: 10/07/2021 PCP: Horald Pollen, MD   Brief Narrative:  87 year old male with history of hypertension, hyperlipidemia, diabetes mellitus type 2, CKD stage IV was found in his home minimally responsive covered in dark stool.  Patient was intubated on arrival; he was found to have creatinine of 9.3, potassium of 7.2, sodium 150 and CK18 100.  Chest x-ray without significant infiltrate.  He was started on IV fluids, broad-spectrum antibiotics and admitted to ICU under PCCM service.  Nephrology was consulted.  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  Neurosurgery recommended conservative management.  He was extubated on 10/10/2021.  2D echo showed right atrial mass.  He was transferred to [facility] service from 10/12/2021 onwards.  Palliative care was also consulted.  Assessment & Plan:   Acute respiratory failure with hypoxia -Intubated on presentation.  Extubated on 10/10/2021.  Transferred to [facility] service from 10/12/2021 onwards. -Still on 2 L oxygen via nasal cannula.  Wean off as able.  Initial chest x-ray was negative for infiltrates.  Acute metabolic encephalopathy -Patient was found unresponsive and needed intubation on presentation. -Unclear if the patient has dementia as well -CT findings as above.  Mental status slightly improved.  Monitor mental status.  Fall precautions. -Diet as per SLP recommendations -Completed recommends SNF placement.  Social worker consult.  Acute versus subacute right parietal subdural hematoma -CT head with old hygroma and 4 mm acute versus subacute subdural hematoma.  Neurosurgery recommended no surgical intervention. -Seizure/aspiration/delirium precautions  Acute kidney injury on chronic kidney disease stage  IV hyperkalemia: Resolved Possible rhabdomyolysis Hypernatremia: Resolved -Creatinine more than 9 on presentation.  Baseline creatinine in epic around 3 last year. -Nephrology followed the patient and signed off on 10/11/2021.  Currently on IV fluids.  Creatinine pending today.  Acidosis improving.  Right atrial mass -2D echo showed 0.2 cm x 0.8 cm mass within the right atrium; does not appear to be attached to tricuspid valve with low suspicion for vegetation.  Cardiology evaluation appreciated: Suspect mass is prominent eustachian tube that is calcified and patient is overall too frail to proceed with invasive procedures at this time: No plan for TEE.  Recommend palliative care evaluation.  PVC/NSVT -Cardiology evaluated the patient on 10/12/2021 and recommended conservative management with low-dose metoprolol and recommended palliative care evaluation.  No plans for ischemic work-up.  Cardiology signed off on 10/12/2021.  Generalized deconditioning Severe protein calorie malnutrition -Follow nutrition recommendations.  Overall prognosis is very poor.  Palliative care following for goals of care discussion: Currently full code.  Aerococcus in blood culture Staph capitis in blood culture -Possibly contaminant.  Follow repeat blood cultures from today.  Currently off of antibiotics.  BPH -Continue tamsulosin/finasteride   DVT prophylaxis: SCDs Code Status: Full Family Communication: None at bedside Disposition Plan: Status is: Inpatient  Remains inpatient appropriate because: Currently still on IV fluids.  Will need SNF placement.  Consultants: PCCM/nephrology/cardiology/palliative care  Procedures: Intubation/extubation.  Echo  Antimicrobials:  Anti-infectives (From admission, onward)    Start     Dose/Rate Route Frequency Ordered Stop   10/08/21 2300  ceFEPIme (MAXIPIME) 1 g in sodium chloride 0.9 % 100 mL IVPB  Status:  Discontinued        1  g 200 mL/hr over 30 Minutes  Intravenous Every 24 hours 10/07/21 2232 10/08/21 0848   10/07/21 1900  ceFEPIme (MAXIPIME) 2 g in sodium chloride 0.9 % 100 mL IVPB        2 g 200 mL/hr over 30 Minutes Intravenous  Once 10/07/21 1846 10/07/21 2014   10/07/21 1900  metroNIDAZOLE (FLAGYL) IVPB 500 mg  Status:  Discontinued        500 mg 100 mL/hr over 60 Minutes Intravenous Every 12 hours 10/07/21 1846 10/08/21 0847   10/07/21 1900  vancomycin (VANCOREADY) IVPB 1250 mg/250 mL        1,250 mg 166.7 mL/hr over 90 Minutes Intravenous  Once 10/07/21 1846 10/07/21 2234   10/07/21 1848  vancomycin variable dose per unstable renal function (pharmacist dosing)  Status:  Discontinued         Does not apply See admin instructions 10/07/21 1849 10/08/21 0847        Subjective: Patient seen and examined at bedside.  Very poor historian.  No overnight vomiting, seizures, agitation, fever reported. Objective: Vitals:   10/12/21 0939 10/12/21 1700 10/12/21 2052 10/13/21 0430  BP: (!) 118/58 (!) 103/49 (!) 150/51 (!) 109/56  Pulse: (!) 51 (!) 41 (!) 53 85  Resp: 16 14 16 18   Temp: 98.2 F (36.8 C) 97.6 F (36.4 C) 97.7 F (36.5 C) 98 F (36.7 C)  TempSrc: Oral Axillary Oral   SpO2: 93% 100% 95% 92%  Weight:      Height:        Intake/Output Summary (Last 24 hours) at 10/13/2021 0746 Last data filed at 10/13/2021 0600 Gross per 24 hour  Intake 1535 ml  Output 1650 ml  Net -115 ml    Filed Weights   10/10/21 0405 10/11/21 0500 10/11/21 2030  Weight: 57.3 kg 60.8 kg 62.9 kg    Examination:  General exam: No distress.  Still on 2 L oxygen via nasal cannula.  Elderly male lying in bed.   Respiratory system: Decreased breath sounds at bases bilaterally with some crackles  cardiovascular system: Bradycardic intermittently; S1-S2 heard gastrointestinal system: Abdomen is distended slightly, soft and nontender.  Bowel sounds are heard  extremities: Mild lower extremity edema present; no clubbing  Central nervous system:  Still very slow to respond to questions; very poor historian.  Hard of hearing.  No focal neurological deficits.  Moves extremities  skin: No obvious petechiae/ecchymosis psychiatry: Cannot assess because of mental status  Data Reviewed: I have personally reviewed following labs and imaging studies  CBC: Recent Labs  Lab 10/07/21 1805 10/07/21 1812 10/08/21 0541 10/09/21 0436 10/11/21 0417 10/12/21 0214 10/13/21 0630  WBC 10.2   < > 5.3 8.1 7.0 8.1 9.0  NEUTROABS 9.6*  --   --  7.3  --   --  8.0*  HGB 8.4*   < > 7.3* 7.4* 7.5* 7.5* 7.6*  HCT 27.0*   < > 22.4* 22.3* 23.6* 23.8* 24.0*  MCV 94.1   < > 89.2 88.8 91.5 91.5 91.3  PLT 110*   < > 99* 83* 90* 111* 131*   < > = values in this interval not displayed.    Basic Metabolic Panel: Recent Labs  Lab 10/08/21 1324 10/08/21 1556 10/09/21 0436 10/09/21 1631 10/10/21 0302 10/11/21 0417 10/12/21 0214  NA 152*  --  146*  --  143 142 141  K 5.0  --  4.4  --  4.6 4.4 4.3  CL 118*  --  113*  --  113* 113* 111  CO2 20*  --  21*  --  20* 20* 21*  GLUCOSE 115*  --  132*  --  129* 114* 111*  BUN 186*  --  167*  --  156* 139* 124*  CREATININE 7.42*  --  6.32*  --  5.16* 4.45* 3.93*  CALCIUM 6.3*  --  6.5*  --  6.3* 6.8* 7.1*  MG  --  2.7* 2.4 2.3 2.1 2.2  --   PHOS  --  7.4* 5.3* 5.2* 5.0* 4.7*  --     GFR: Estimated Creatinine Clearance: 10.7 mL/min (A) (by C-G formula based on SCr of 3.93 mg/dL (H)). Liver Function Tests: Recent Labs  Lab 10/07/21 1805 10/07/21 2259 10/11/21 0417  AST 52*  --  38  ALT 61*  --  42  ALKPHOS 32*  --  27*  BILITOT 0.9  --  0.5  PROT 5.5*  --  4.5*  ALBUMIN 2.6* 2.4* 1.8*    No results for input(s): LIPASE, AMYLASE in the last 168 hours. Recent Labs  Lab 10/07/21 1805  AMMONIA 13    Coagulation Profile: Recent Labs  Lab 10/07/21 1805  INR 1.3*    Cardiac Enzymes: Recent Labs  Lab 10/07/21 1805 10/08/21 0240  CKTOTAL 1,839* 1,707*    BNP (last 3 results) No results  for input(s): PROBNP in the last 8760 hours. HbA1C: No results for input(s): HGBA1C in the last 72 hours. CBG: Recent Labs  Lab 10/12/21 1354 10/12/21 1715 10/12/21 2049 10/12/21 2352 10/13/21 0428  GLUCAP 123* 120* 134* 111* 108*    Lipid Profile: No results for input(s): CHOL, HDL, LDLCALC, TRIG, CHOLHDL, LDLDIRECT in the last 72 hours. Thyroid Function Tests: No results for input(s): TSH, T4TOTAL, FREET4, T3FREE, THYROIDAB in the last 72 hours. Anemia Panel: No results for input(s): VITAMINB12, FOLATE, FERRITIN, TIBC, IRON, RETICCTPCT in the last 72 hours. Sepsis Labs: Recent Labs  Lab 10/07/21 1805  LATICACIDVEN 1.2     Recent Results (from the past 240 hour(s))  Blood Cultures (routine x 2)     Status: Abnormal   Collection Time: 10/07/21  6:00 PM   Specimen: BLOOD RIGHT ARM  Result Value Ref Range Status   Specimen Description BLOOD RIGHT ARM  Final   Special Requests   Final    BOTTLES DRAWN AEROBIC AND ANAEROBIC Blood Culture results may not be optimal due to an inadequate volume of blood received in culture bottles   Culture  Setup Time   Final    GRAM POSITIVE COCCI IN CLUSTERS AEROBIC BOTTLE ONLY CRITICAL RESULT CALLED TO, READ BACK BY AND VERIFIED WITHTillman Sers PHARMD 1856 10/08/21 A BROWNING    Culture (A)  Final    STAPHYLOCOCCUS CAPITIS THE SIGNIFICANCE OF ISOLATING THIS ORGANISM FROM A SINGLE SET OF BLOOD CULTURES WHEN MULTIPLE SETS ARE DRAWN IS UNCERTAIN. PLEASE NOTIFY THE MICROBIOLOGY DEPARTMENT WITHIN ONE WEEK IF SPECIATION AND SENSITIVITIES ARE REQUIRED. Performed at [facility], [place] [address], [place], [place] [address]    Report Status 10/09/2021 FINAL  Final  Blood Culture ID Panel (Reflexed)     Status: Abnormal   Collection Time: 10/07/21  6:00 PM  Result Value Ref Range Status   Enterococcus faecalis NOT DETECTED NOT DETECTED Final   Enterococcus Faecium NOT DETECTED NOT DETECTED Final   Listeria monocytogenes NOT DETECTED NOT  DETECTED Final   Staphylococcus species DETECTED (A) NOT DETECTED Final    Comment: CRITICAL RESULT CALLED TO, READ BACK  BY AND VERIFIED WITHTillman Sers PHARMD 4128 10/08/21 A BROWNING    Staphylococcus aureus (BCID) NOT DETECTED NOT DETECTED Final   Staphylococcus epidermidis NOT DETECTED NOT DETECTED Final   Staphylococcus lugdunensis NOT DETECTED NOT DETECTED Final   Streptococcus species NOT DETECTED NOT DETECTED Final   Streptococcus agalactiae NOT DETECTED NOT DETECTED Final   Streptococcus pneumoniae NOT DETECTED NOT DETECTED Final   Streptococcus pyogenes NOT DETECTED NOT DETECTED Final   A.calcoaceticus-baumannii NOT DETECTED NOT DETECTED Final   Bacteroides fragilis NOT DETECTED NOT DETECTED Final   Enterobacterales NOT DETECTED NOT DETECTED Final   Enterobacter cloacae complex NOT DETECTED NOT DETECTED Final   Escherichia coli NOT DETECTED NOT DETECTED Final   Klebsiella aerogenes NOT DETECTED NOT DETECTED Final   Klebsiella oxytoca NOT DETECTED NOT DETECTED Final   Klebsiella pneumoniae NOT DETECTED NOT DETECTED Final   Proteus species NOT DETECTED NOT DETECTED Final   Salmonella species NOT DETECTED NOT DETECTED Final   Serratia marcescens NOT DETECTED NOT DETECTED Final   Haemophilus influenzae NOT DETECTED NOT DETECTED Final   Neisseria meningitidis NOT DETECTED NOT DETECTED Final   Pseudomonas aeruginosa NOT DETECTED NOT DETECTED Final   Stenotrophomonas maltophilia NOT DETECTED NOT DETECTED Final   Candida albicans NOT DETECTED NOT DETECTED Final   Candida auris NOT DETECTED NOT DETECTED Final   Candida glabrata NOT DETECTED NOT DETECTED Final   Candida krusei NOT DETECTED NOT DETECTED Final   Candida parapsilosis NOT DETECTED NOT DETECTED Final   Candida tropicalis NOT DETECTED NOT DETECTED Final   Cryptococcus neoformans/gattii NOT DETECTED NOT DETECTED Final    Comment: Performed at [facility], [address], [place], [place] [address]  Urine Culture      Status: None   Collection Time: 10/07/21  6:06 PM   Specimen: Urine, Catheterized  Result Value Ref Range Status   Specimen Description URINE, CATHETERIZED  Final   Special Requests NONE  Final   Culture   Final    NO GROWTH Performed at [facility], [address] [place], [place] [address]    Report Status 10/08/2021 FINAL  Final  Resp Panel by RT-PCR (Flu A&B, Covid) Urine, Clean Catch     Status: None   Collection Time: 10/07/21  6:07 PM   Specimen: Urine, Clean Catch; Nasopharyngeal(NP) swabs in vial transport medium  Result Value Ref Range Status   SARS Coronavirus 2 by RT PCR NEGATIVE NEGATIVE Final    Comment: (NOTE) SARS-CoV-2 target nucleic acids are NOT DETECTED.  The SARS-CoV-2 RNA is generally detectable in upper respiratory specimens during the acute phase of infection. The lowest concentration of SARS-CoV-2 viral copies this assay can detect is 138 copies/mL. A negative result does not preclude SARS-Cov-2 infection and should not be used as the sole basis for treatment or other patient management decisions. A negative result may occur with  improper specimen collection/handling, submission of specimen other than nasopharyngeal swab, presence of viral mutation(s) within the areas targeted by this assay, and inadequate number of viral copies(<138 copies/mL). A negative result must be combined with clinical observations, patient history, and epidemiological information. The expected result is Negative.  Fact Sheet for Patients:  EntrepreneurPulse.com.au  Fact Sheet for Healthcare Providers:  IncredibleEmployment.be  This test is no t yet approved or cleared by the [place] FDA and  has been authorized for detection and/or diagnosis of SARS-CoV-2 by FDA under an Emergency Use Authorization (EUA). This EUA will remain  in effect (meaning this  test can be used) for the duration of the COVID-19 declaration under Section  564(b)(1) of the Act, 21 U.S.C.section 360bbb-3(b)(1), unless the authorization is terminated  or revoked sooner.       Influenza A by PCR NEGATIVE NEGATIVE Final   Influenza B by PCR NEGATIVE NEGATIVE Final    Comment: (NOTE) The Xpert Xpress SARS-CoV-2/FLU/RSV plus assay is intended as an aid in the diagnosis of influenza from Nasopharyngeal swab specimens and should not be used as a sole basis for treatment. Nasal washings and aspirates are unacceptable for Xpert Xpress SARS-CoV-2/FLU/RSV testing.  Fact Sheet for Patients: EntrepreneurPulse.com.au  Fact Sheet for Healthcare Providers: IncredibleEmployment.be  This test is not yet approved or cleared by the [place] FDA and has been authorized for detection and/or diagnosis of SARS-CoV-2 by FDA under an Emergency Use Authorization (EUA). This EUA will remain in effect (meaning this test can be used) for the duration of the COVID-19 declaration under Section 564(b)(1) of the Act, 21 U.S.C. section 360bbb-3(b)(1), unless the authorization is terminated or revoked.  Performed at [facility], [place] [address], [place], [place] [address]   Blood Cultures (routine x 2)     Status: Abnormal   Collection Time: 10/07/21  7:05 PM   Specimen: Right Antecubital; Blood  Result Value Ref Range Status   Specimen Description RIGHT ANTECUBITAL  Final   Special Requests (A)  Final    AEROCOCCUS SPECIES Blood Culture results may not be optimal due to an inadequate volume of blood received in culture bottles   Culture   Final    NO GROWTH 5 DAYS Performed at [facility], [place] [address], [place], [place] [address]    Report Status 10/12/2021 FINAL  Final  MRSA Next Gen by PCR, Nasal     Status: None   Collection Time: 10/07/21 10:13 PM   Specimen: Nasal Mucosa; Nasal Swab  Result Value Ref Range Status   MRSA by PCR Next Gen NOT DETECTED NOT DETECTED Final    Comment: (NOTE) The  GeneXpert MRSA Assay (FDA approved for NASAL specimens only), is one component of a comprehensive MRSA colonization surveillance program. It is not intended to diagnose MRSA infection nor to guide or monitor treatment for MRSA infections. Test performance is not FDA approved in patients less than 26 years old. Performed at [facility], [place] [address] [place], [place] [address]           Radiology Studies: No results found.      Scheduled Meds:  Chlorhexidine Gluconate Cloth  6 each Topical Daily   feeding supplement  237 mL Oral BID BM   finasteride  5 mg Oral Daily   insulin aspart  0-6 Units Subcutaneous Q4H   mouth rinse  15 mL Mouth Rinse BID   metoprolol tartrate  12.5 mg Oral BID   multivitamin  1 tablet Oral QHS   pantoprazole  40 mg Oral QHS   tamsulosin  0.4 mg Oral Daily   Continuous Infusions:  sodium chloride Stopped (10/07/21 2059)   dextrose 5 % and 0.45% NaCl 75 mL/hr at 10/13/21 0649          Aline August, MD [facility] 10/13/2021, 7:46 AM

## 2021-10-14 DIAGNOSIS — R627 Adult failure to thrive: Secondary | ICD-10-CM

## 2021-10-14 DIAGNOSIS — R4182 Altered mental status, unspecified: Secondary | ICD-10-CM

## 2021-10-14 DIAGNOSIS — Z66 Do not resuscitate: Secondary | ICD-10-CM

## 2021-10-14 LAB — BASIC METABOLIC PANEL
Anion gap: 6 (ref 5–15)
BUN: 82 mg/dL — ABNORMAL HIGH (ref 8–23)
CO2: 20 mmol/L — ABNORMAL LOW (ref 22–32)
Calcium: 7.6 mg/dL — ABNORMAL LOW (ref 8.9–10.3)
Chloride: 113 mmol/L — ABNORMAL HIGH (ref 98–111)
Creatinine, Ser: 2.89 mg/dL — ABNORMAL HIGH (ref 0.61–1.24)
GFR, Estimated: 20 mL/min — ABNORMAL LOW (ref >60)
Glucose, Bld: 109 mg/dL — ABNORMAL HIGH (ref 70–99)
Potassium: 4.2 mmol/L (ref 3.5–5.1)
Sodium: 139 mmol/L (ref 135–145)

## 2021-10-14 LAB — CBC WITH DIFFERENTIAL/PLATELET
Abs Immature Granulocytes: 0.03 10*3/uL (ref 0.00–0.07)
Basophils Absolute: 0 10*3/uL (ref 0.0–0.1)
Basophils Relative: 0 %
Eosinophils Absolute: 0.1 10*3/uL (ref 0.0–0.5)
Eosinophils Relative: 2 %
HCT: 23 % — ABNORMAL LOW (ref 39.0–52.0)
Hemoglobin: 7.1 g/dL — ABNORMAL LOW (ref 13.0–17.0)
Immature Granulocytes: 0 %
Lymphocytes Relative: 7 %
Lymphs Abs: 0.5 10*3/uL — ABNORMAL LOW (ref 0.7–4.0)
MCH: 28.5 pg (ref 26.0–34.0)
MCHC: 30.9 g/dL (ref 30.0–36.0)
MCV: 92.4 fL (ref 80.0–100.0)
Monocytes Absolute: 0.5 10*3/uL (ref 0.1–1.0)
Monocytes Relative: 7 %
Neutro Abs: 6.3 10*3/uL (ref 1.7–7.7)
Neutrophils Relative %: 84 %
Platelets: 131 10*3/uL — ABNORMAL LOW (ref 150–400)
RBC: 2.49 MIL/uL — ABNORMAL LOW (ref 4.22–5.81)
RDW: 13.7 % (ref 11.5–15.5)
WBC: 7.5 10*3/uL (ref 4.0–10.5)
nRBC: 0 % (ref 0.0–0.2)

## 2021-10-14 LAB — GLUCOSE, CAPILLARY
Glucose-Capillary: 103 mg/dL — ABNORMAL HIGH (ref 70–99)
Glucose-Capillary: 93 mg/dL (ref 70–99)
Glucose-Capillary: 116 mg/dL — ABNORMAL HIGH (ref 70–99)

## 2021-10-14 LAB — MAGNESIUM: Magnesium: 2.1 mg/dL (ref 1.7–2.4)

## 2021-10-14 MED ORDER — LORAZEPAM 0.5 MG PO TABS: 0.5000 mg | ORAL_TABLET | Freq: Four times a day (QID) | ORAL | Status: DC | PRN

## 2021-10-14 MED ORDER — MORPHINE SULFATE (CONCENTRATE) 10 MG/0.5ML PO SOLN: 5.0000 mg | ORAL | Status: DC | PRN

## 2021-10-14 NOTE — Care Management Important Message (Signed)
Important Message  Patient Details  Name: Ryan Cortez MRN: [id number] Date of Birth: Aug 04, 1927   Medicare Important Message Given:  Yes     Orbie Pyo 10/14/2021, 4:27 PM

## 2021-10-14 NOTE — Progress Notes (Signed)
Patient ID: Ryan Cortez, male   DOB: 1927-05-21, 87 y.o.   MRN: [id number]    Progress Note from the Palliative Medicine Team at [facility]   Patient Name: Ryan Cortez        Date: 10/14/2021 DOB: 1927/08/12  Age: 87 y.o. MRN#: [id number] Attending Physician: Aline August, MD Primary Care Physician: Horald Pollen, MD Admit Date: 10/07/2021   Medical records reviewed   Per intake H&P --> 87 year old male with history of hypertension, hyperlipidemia, diabetes mellitus type 2, CKD stage IV was found in his home minimally responsive covered in dark stool.  Patient was intubated on arrival; he was found to have creatinine of 9.3, potassium of 7.2, sodium 150 and CK18 100.  Chest x-ray without significant infiltrate.  He was started on IV fluids, broad-spectrum antibiotics and admitted to ICU under PCCM service.  Nephrology was consulted.  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  Neurosurgery recommended conservative management.  He was extubated on 10/10/2021.  2D echo showed right atrial mass.   Palliative care has been consulted in the setting of chronic comorbid conditions to further discuss goals of care.  Initial palliative medicine consult was completed on 10/12/2021  Patient has had continued physical, and functional and cognitive decline over the past 6 days of this hospitalization within the context of full medical support.  Ryan Cortez is frail and cachectic, today is lethargic and confused and unable to participate in today's plan of care meeting.  I met today at the bedside with 2 individuals with established relationship with the patient who are acting in good faith and can reliably convey the wishes of the patient; Ryan Cortez (lifelong friend ) and Ryan Cortez (she considers herself daughter).   In the event that Ryan Cortez cannot verbalize his own decisions,  Ryan Cortez and will work for the patient's best interest. Again today Ryan Cortez does not have medical decision capacity at this time.   Ryan Cortez was widowed, wife died 2 years ago, he has no children.  Prior to this hospitalization Ryan Cortez was living alone in an apartment.    Education offered today to Ryan Cortez regarding patient's overall multiple comorbidities and long-term poor prognosis.    Education regarding advanced care planning concept specific to CODE STATUS, artificial feeding and hydration, continued use of IV antibiotics, and anticipatory care needs was offered in detail.  Education offered on the difference between aggressive medical intervention path and a palliative comfort path for this patient at this time in this situation.  Ryan Cortez and Ryan Cortez verbalizes an understanding of the seriousness of the patient's current medical situation and the overall poor prognosis.  They verbalize a sense of "knowing" Ryan Cortez was preparing them for the end of his life.  They express love and appreciation for him and their hope for comfort and dignity at this time in his life. Both are comfortable with decision to shift to comfort at this time  Plan of care -DNR/DNI -No artificial feeding or hydration now or in the future -No further life prolonging measures; DC IV fluids and IV antibiotics, no further labs, no transfusions -Symptom management -Comfort, quality and dignity or focus of care   MOST form completed   PMT will evaluate in 24 to 36 hours for transition of care options  Questions and concerns addressed   Discussed with Dr  Starla Link  and bedside RN/[name]  Total time spent on the unit was 65 minutes  Greater than 50% of the time was spent in counseling and coordination of care  Wadie Lessen NP  Palliative Medicine Team Team Phone # [phone] Pager [phone]

## 2021-10-14 NOTE — Progress Notes (Signed)
Patient ID: Ronnell Clinger, male   DOB: [birth year], 87 y.o.   MRN: [id number]  PROGRESS NOTE    Infant Zink  ZSW:[id number] DOB: 01-01-1927 DOA: 10/07/2021 PCP: Horald Pollen, MD   Brief Narrative:  87 year old male with history of hypertension, hyperlipidemia, diabetes mellitus type 2, CKD stage IV was found in his home minimally responsive covered in dark stool.  Patient was intubated on arrival; he was found to have creatinine of 9.3, potassium of 7.2, sodium 150 and CK18 100.  Chest x-ray without significant infiltrate.  He was started on IV fluids, broad-spectrum antibiotics and admitted to ICU under PCCM service.  Nephrology was consulted.  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  Neurosurgery recommended conservative management.  He was extubated on 10/10/2021.  2D echo showed right atrial mass.  He was transferred to [facility] service from 10/12/2021 onwards.  Palliative care was also consulted.  Assessment & Plan:   Acute respiratory failure with hypoxia -Intubated on presentation.  Extubated on 10/10/2021.  Transferred to [facility] service from 10/12/2021 onwards. -Still on intermittently 2 L oxygen via nasal cannula.  Wean off as able.  Initial chest x-ray was negative for infiltrates.  Acute metabolic encephalopathy -Patient was found unresponsive and needed intubation on presentation. -Unclear if the patient has dementia as well -CT findings as above.  Mental status slightly improved but still extremely slow to respond.  Monitor mental status.  Fall precautions. -Diet as per SLP recommendations -PT/OT recommend SNF placement.  Social worker consult.  Acute versus subacute right parietal subdural hematoma -CT head with old hygroma and 4 mm acute versus subacute subdural hematoma.  Neurosurgery recommended no surgical intervention. -Seizure/aspiration/delirium precautions  Acute  kidney injury on chronic kidney disease stage IV hyperkalemia: Resolved Possible rhabdomyolysis Hypernatremia: Resolved -Creatinine more than 9 on presentation.  Baseline creatinine in epic around 3 last year. -Nephrology followed the patient and signed off on 10/11/2021.  Currently on IV fluids.  Creatinine 2.89 today.  Acidosis improving.  Right atrial mass -2D echo showed 0.2 cm x 0.8 cm mass within the right atrium; does not appear to be attached to tricuspid valve with low suspicion for vegetation.  Cardiology evaluation appreciated: Suspect mass is prominent eustachian tube that is calcified and patient is overall too frail to proceed with invasive procedures at this time: No plan for TEE.  Recommend palliative care evaluation.  Cardiology has signed off.  PVC/NSVT -Cardiology evaluated the patient on 10/12/2021 and recommended conservative management with low-dose metoprolol and recommended palliative care evaluation.  No plans for ischemic work-up.  Cardiology signed off on 10/12/2021.  Generalized deconditioning Severe protein calorie malnutrition -Follow nutrition recommendations.  Overall prognosis is very poor.  Palliative care following for goals of care discussion: Currently full code. -Consider hospice/comfort measures  Anemia of chronic disease -Possibly from renal failure.  No signs of overt bleeding.  Hemoglobin 7.1 today.  Transfuse if hemoglobin is less than 7  Thrombocytopenia -Questionable cause.  Monitor.  No signs of bleeding.  Aerococcus in blood culture Staph capitis in blood culture -Possibly contaminant.  Follow repeat blood cultures from today.  Currently off of antibiotics.  BPH -Continue tamsulosin/finasteride   DVT prophylaxis: SCDs Code Status: Full Family Communication: None at bedside Disposition Plan: Status is: Inpatient  Remains inpatient appropriate because: Currently still on IV fluids.  Will need SNF placement.  Consultants:  PCCM/nephrology/cardiology/palliative care  Procedures: Intubation/extubation.  Echo  Antimicrobials:  Anti-infectives (From admission, onward)    Start     Dose/Rate Route Frequency Ordered Stop   10/08/21 2300  ceFEPIme (MAXIPIME) 1 g in sodium chloride 0.9 % 100 mL IVPB  Status:  Discontinued        1 g 200 mL/hr over 30 Minutes Intravenous Every 24 hours 10/07/21 2232 10/08/21 0848   10/07/21 1900  ceFEPIme (MAXIPIME) 2 g in sodium chloride 0.9 % 100 mL IVPB        2 g 200 mL/hr over 30 Minutes Intravenous  Once 10/07/21 1846 10/07/21 2014   10/07/21 1900  metroNIDAZOLE (FLAGYL) IVPB 500 mg  Status:  Discontinued        500 mg 100 mL/hr over 60 Minutes Intravenous Every 12 hours 10/07/21 1846 10/08/21 0847   10/07/21 1900  vancomycin (VANCOREADY) IVPB 1250 mg/250 mL        1,250 mg 166.7 mL/hr over 90 Minutes Intravenous  Once 10/07/21 1846 10/07/21 2234   10/07/21 1848  vancomycin variable dose per unstable renal function (pharmacist dosing)  Status:  Discontinued         Does not apply See admin instructions 10/07/21 1849 10/08/21 0847        Subjective: Patient seen and examined at bedside.  Very poor historian.  No seizures, agitation, fever or vomiting reported.   Objective: Vitals:   10/13/21 0845 10/13/21 1234 10/13/21 1719 10/14/21 0651  BP: 130/85 117/69 (!) 117/39 (!) 123/43  Pulse: 93 93 75 74  Resp: 17 17 17 18   Temp: (!) 97.5 F (36.4 C)  97.8 F (36.6 C)   TempSrc: Oral     SpO2: 99%  (!) 74% 99%  Weight:      Height:        Intake/Output Summary (Last 24 hours) at 10/14/2021 0724 Last data filed at 10/14/2021 0400 Gross per 24 hour  Intake 2399.48 ml  Output 1650 ml  Net 749.48 ml    Filed Weights   10/10/21 0405 10/11/21 0500 10/11/21 2030  Weight: 57.3 kg 60.8 kg 62.9 kg    Examination:  General exam: On 2 L oxygen by nasal cannula intermittently.  No acute distress.  Elderly male lying in bed.  Extremely hard of hearing Respiratory  system: Bilateral decreased breath sounds at bases with some crackles  cardiovascular system: S1-S2 heard; currently rate controlled gastrointestinal system: Abdomen is mildly distended, soft and nontender.  Normal bowel sounds heard  extremities: No cyanosis; bilateral lower extremity trace edema present  Central nervous system: Extremely slow to respond.  Poor historian.  No focal neurological deficits.  Moving extremities skin: No obvious ecchymosis/petechiae psychiatry: Could not be assessed because of mental status Data Reviewed: I have personally reviewed following labs and imaging studies  CBC: Recent Labs  Lab 10/07/21 1805 10/07/21 1812 10/09/21 0436 10/11/21 0417 10/12/21 0214 10/13/21 0630 10/14/21 0414  WBC 10.2   < > 8.1 7.0 8.1 9.0 7.5  NEUTROABS 9.6*  --  7.3  --   --  8.0* 6.3  HGB 8.4*   < > 7.4* 7.5* 7.5* 7.6* 7.1*  HCT 27.0*   < > 22.3* 23.6* 23.8* 24.0* 23.0*  MCV 94.1   < > 88.8 91.5 91.5 91.3 92.4  PLT 110*   < > 83* 90* 111* 131* 131*   < > = values in this interval not displayed.    Basic Metabolic Panel: Recent Labs  Lab 10/08/21 1556 10/09/21 0436 10/09/21 1631  10/10/21 0302 10/11/21 0417 10/12/21 0214 10/13/21 0630 10/14/21 0414  NA  --  146*  --  143 142 141 139 139  K  --  4.4  --  4.6 4.4 4.3 4.3 4.2  CL  --  113*  --  113* 113* 111 111 113*  CO2  --  21*  --  20* 20* 21* 21* 20*  GLUCOSE  --  132*  --  129* 114* 111* 108* 109*  BUN  --  167*  --  156* 139* 124* 99* 82*  CREATININE  --  6.32*  --  5.16* 4.45* 3.93* 3.33* 2.89*  CALCIUM  --  6.5*  --  6.3* 6.8* 7.1* 7.4* 7.6*  MG 2.7* 2.4 2.3 2.1 2.2  --  2.0 2.1  PHOS 7.4* 5.3* 5.2* 5.0* 4.7*  --   --   --     GFR: Estimated Creatinine Clearance: 14.5 mL/min (A) (by C-G formula based on SCr of 2.89 mg/dL (H)). Liver Function Tests: Recent Labs  Lab 10/07/21 1805 10/07/21 2259 10/11/21 0417 10/13/21 0630  AST 52*  --  38 48*  ALT 61*  --  42 48*  ALKPHOS 32*  --  27* 37*   BILITOT 0.9  --  0.5 0.6  PROT 5.5*  --  4.5* 4.9*  ALBUMIN 2.6* 2.4* 1.8* 1.9*    No results for input(s): LIPASE, AMYLASE in the last 168 hours. Recent Labs  Lab 10/07/21 1805  AMMONIA 13    Coagulation Profile: Recent Labs  Lab 10/07/21 1805  INR 1.3*    Cardiac Enzymes: Recent Labs  Lab 10/07/21 1805 10/08/21 0240  CKTOTAL 1,839* 1,707*    BNP (last 3 results) No results for input(s): PROBNP in the last 8760 hours. HbA1C: No results for input(s): HGBA1C in the last 72 hours. CBG: Recent Labs  Lab 10/13/21 0858 10/13/21 1149 10/13/21 1721 10/13/21 2338 10/14/21 0358  GLUCAP 97 127* 93 107* 103*    Lipid Profile: No results for input(s): CHOL, HDL, LDLCALC, TRIG, CHOLHDL, LDLDIRECT in the last 72 hours. Thyroid Function Tests: No results for input(s): TSH, T4TOTAL, FREET4, T3FREE, THYROIDAB in the last 72 hours. Anemia Panel: No results for input(s): VITAMINB12, FOLATE, FERRITIN, TIBC, IRON, RETICCTPCT in the last 72 hours. Sepsis Labs: Recent Labs  Lab 10/07/21 1805  LATICACIDVEN 1.2     Recent Results (from the past 240 hour(s))  Blood Cultures (routine x 2)     Status: Abnormal   Collection Time: 10/07/21  6:00 PM   Specimen: BLOOD RIGHT ARM  Result Value Ref Range Status   Specimen Description BLOOD RIGHT ARM  Final   Special Requests   Final    BOTTLES DRAWN AEROBIC AND ANAEROBIC Blood Culture results may not be optimal due to an inadequate volume of blood received in culture bottles   Culture  Setup Time   Final    GRAM POSITIVE COCCI IN CLUSTERS AEROBIC BOTTLE ONLY CRITICAL RESULT CALLED TO, READ BACK BY AND VERIFIED WITHTillman Sers PHARMD [id number] 10/08/21 A BROWNING    Culture (A)  Final    STAPHYLOCOCCUS CAPITIS THE SIGNIFICANCE OF ISOLATING THIS ORGANISM FROM A SINGLE SET OF BLOOD CULTURES WHEN MULTIPLE SETS ARE DRAWN IS UNCERTAIN. PLEASE NOTIFY THE MICROBIOLOGY DEPARTMENT WITHIN ONE WEEK IF SPECIATION AND SENSITIVITIES ARE  REQUIRED. Performed at [facility], [place] [address], [place], [place] [address]    Report Status 10/09/2021 FINAL  Final  Blood Culture ID Panel (Reflexed)  Status: Abnormal   Collection Time: 10/07/21  6:00 PM  Result Value Ref Range Status   Enterococcus faecalis NOT DETECTED NOT DETECTED Final   Enterococcus Faecium NOT DETECTED NOT DETECTED Final   Listeria monocytogenes NOT DETECTED NOT DETECTED Final   Staphylococcus species DETECTED (A) NOT DETECTED Final    Comment: CRITICAL RESULT CALLED TO, READ BACK BY AND VERIFIED WITHTillman Sers PHARMD [id number] 10/08/21 A BROWNING    Staphylococcus aureus (BCID) NOT DETECTED NOT DETECTED Final   Staphylococcus epidermidis NOT DETECTED NOT DETECTED Final   Staphylococcus lugdunensis NOT DETECTED NOT DETECTED Final   Streptococcus species NOT DETECTED NOT DETECTED Final   Streptococcus agalactiae NOT DETECTED NOT DETECTED Final   Streptococcus pneumoniae NOT DETECTED NOT DETECTED Final   Streptococcus pyogenes NOT DETECTED NOT DETECTED Final   A.calcoaceticus-baumannii NOT DETECTED NOT DETECTED Final   Bacteroides fragilis NOT DETECTED NOT DETECTED Final   Enterobacterales NOT DETECTED NOT DETECTED Final   Enterobacter cloacae complex NOT DETECTED NOT DETECTED Final   Escherichia coli NOT DETECTED NOT DETECTED Final   Klebsiella aerogenes NOT DETECTED NOT DETECTED Final   Klebsiella oxytoca NOT DETECTED NOT DETECTED Final   Klebsiella pneumoniae NOT DETECTED NOT DETECTED Final   Proteus species NOT DETECTED NOT DETECTED Final   Salmonella species NOT DETECTED NOT DETECTED Final   Serratia marcescens NOT DETECTED NOT DETECTED Final   Haemophilus influenzae NOT DETECTED NOT DETECTED Final   Neisseria meningitidis NOT DETECTED NOT DETECTED Final   Pseudomonas aeruginosa NOT DETECTED NOT DETECTED Final   Stenotrophomonas maltophilia NOT DETECTED NOT DETECTED Final   Candida albicans NOT DETECTED NOT DETECTED Final   Candida auris NOT  DETECTED NOT DETECTED Final   Candida glabrata NOT DETECTED NOT DETECTED Final   Candida krusei NOT DETECTED NOT DETECTED Final   Candida parapsilosis NOT DETECTED NOT DETECTED Final   Candida tropicalis NOT DETECTED NOT DETECTED Final   Cryptococcus neoformans/gattii NOT DETECTED NOT DETECTED Final    Comment: Performed at [facility], [address] [place], [place] [address]  Urine Culture     Status: None   Collection Time: 10/07/21  6:06 PM   Specimen: Urine, Catheterized  Result Value Ref Range Status   Specimen Description URINE, CATHETERIZED  Final   Special Requests NONE  Final   Culture   Final    NO GROWTH Performed at [facility], [address], [place],  [address]    Report Status 10/08/2021 FINAL  Final  Resp Panel by RT-PCR (Flu A&B, Covid) Urine, Clean Catch     Status: None   Collection Time: 10/07/21  6:07 PM   Specimen: Urine, Clean Catch; Nasopharyngeal(NP) swabs in vial transport medium  Result Value Ref Range Status   SARS Coronavirus 2 by RT PCR NEGATIVE NEGATIVE Final    Comment: (NOTE) SARS-CoV-2 target nucleic acids are NOT DETECTED.  The SARS-CoV-2 RNA is generally detectable in upper respiratory specimens during the acute phase of infection. The lowest concentration of SARS-CoV-2 viral copies this assay can detect is 138 copies/mL. A negative result does not preclude SARS-Cov-2 infection and should not be used as the sole basis for treatment or other patient management decisions. A negative result may occur with  improper specimen collection/handling, submission of specimen other than nasopharyngeal swab, presence of viral mutation(s) within the areas targeted by this assay, and inadequate number of viral copies(<138 copies/mL). A negative result must be combined with clinical observations, patient history, and epidemiological information. The  expected result is Negative.  Fact Sheet for Patients:   EntrepreneurPulse.com.au  Fact Sheet for Healthcare Providers:  IncredibleEmployment.be  This test is no t yet approved or cleared by the [place] FDA and  has been authorized for detection and/or diagnosis of SARS-CoV-2 by FDA under an Emergency Use Authorization (EUA). This EUA will remain  in effect (meaning this test can be used) for the duration of the COVID-19 declaration under Section 564(b)(1) of the Act, 21 U.S.C.section 360bbb-3(b)(1), unless the authorization is terminated  or revoked sooner.       Influenza A by PCR NEGATIVE NEGATIVE Final   Influenza B by PCR NEGATIVE NEGATIVE Final    Comment: (NOTE) The Xpert Xpress SARS-CoV-2/FLU/RSV plus assay is intended as an aid in the diagnosis of influenza from Nasopharyngeal swab specimens and should not be used as a sole basis for treatment. Nasal washings and aspirates are unacceptable for Xpert Xpress SARS-CoV-2/FLU/RSV testing.  Fact Sheet for Patients: EntrepreneurPulse.com.au  Fact Sheet for Healthcare Providers: IncredibleEmployment.be  This test is not yet approved or cleared by the [place] FDA and has been authorized for detection and/or diagnosis of SARS-CoV-2 by FDA under an Emergency Use Authorization (EUA). This EUA will remain in effect (meaning this test can be used) for the duration of the COVID-19 declaration under Section 564(b)(1) of the Act, 21 U.S.C. section 360bbb-3(b)(1), unless the authorization is terminated or revoked.  Performed at [facility], [place] [address], [place], [place] [address]   Blood Cultures (routine x 2)     Status: Abnormal   Collection Time: 10/07/21  7:05 PM   Specimen: Right Antecubital; Blood  Result Value Ref Range Status   Specimen Description RIGHT ANTECUBITAL  Final   Special Requests (A)  Final    AEROCOCCUS SPECIES Blood Culture results may not be optimal due to an inadequate  volume of blood received in culture bottles   Culture   Final    NO GROWTH 5 DAYS Performed at [facility], [place] [address], [place], [place] [address]    Report Status 10/12/2021 FINAL  Final  MRSA Next Gen by PCR, Nasal     Status: None   Collection Time: 10/07/21 10:13 PM   Specimen: Nasal Mucosa; Nasal Swab  Result Value Ref Range Status   MRSA by PCR Next Gen NOT DETECTED NOT DETECTED Final    Comment: (NOTE) The GeneXpert MRSA Assay (FDA approved for NASAL specimens only), is one component of a comprehensive MRSA colonization surveillance program. It is not intended to diagnose MRSA infection nor to guide or monitor treatment for MRSA infections. Test performance is not FDA approved in patients less than 77 years old. Performed at [facility], [place] [address], [place], [place] [address]           Radiology Studies: No results found.      Scheduled Meds:  Chlorhexidine Gluconate Cloth  6 each Topical Daily   feeding supplement  237 mL Oral BID BM   finasteride  5 mg Oral Daily   insulin aspart  0-6 Units Subcutaneous Q4H   mouth rinse  15 mL Mouth Rinse BID   metoprolol tartrate  12.5 mg Oral BID   multivitamin  1 tablet Oral QHS   pantoprazole  40 mg Oral QHS   tamsulosin  0.4 mg Oral Daily   Continuous Infusions:  sodium chloride Stopped (10/07/21 2059)   dextrose 5 % and 0.45% NaCl 75 mL/hr at 10/13/21 2022  Aline August, MD [facility] 10/14/2021, 7:24 AM

## 2021-10-14 NOTE — TOC Progression Note (Signed)
Transition of Care [facility]) - Progression Note    Patient Details  Name: Flint Hakeem MRN: [id number] Date of Birth: 1927-07-30  Transition of Care [facility]) CM/SW [place], LCSW Phone Number: 10/14/2021, 4:39 PM  Clinical Narrative:    No family present in room; will call patient's daughter.  CSW contacted patient's daughter who reported she is on the way to the hospital and her mom is in the patient's room.   CSW met with [place] and Mardene Celeste in room with Palliative present. Per palliative, they would like to discuss goals of care further.  4:40pm-CSW updated by palliative that plan is to shift to comfort. Will continue to follow for needs.    Expected Discharge Plan: Skilled Nursing Facility Barriers to Discharge: Other (must enter comment), Continued Medical Work up (pending palliative consult)  Expected Discharge Plan and Services Expected Discharge Plan: [place] In-house Referral: Clinical Social Work   Post Acute Care Choice:  (TBD) Living arrangements for the past 2 months: Apartment                                       Social Determinants of Health (SDOH) Interventions    Readmission Risk Interventions No flowsheet data found.

## 2021-10-15 DIAGNOSIS — Z515 Encounter for palliative care: Secondary | ICD-10-CM

## 2021-10-15 NOTE — Progress Notes (Signed)
Patient ID: Ryan Cortez, male   DOB: 05/25/1927, 87 y.o.   MRN: [id number]  PROGRESS NOTE    Ryan Cortez  NIO:[id number] DOB: 11-24-1926 DOA: 10/07/2021 PCP: Horald Pollen, MD   Brief Narrative:  87 year old male with history of hypertension, hyperlipidemia, diabetes mellitus type 2, CKD stage IV was found in his home minimally responsive covered in dark stool.  Patient was intubated on arrival; he was found to have creatinine of 9.3, potassium of 7.2, sodium 150 and CK18 100.  Chest x-ray without significant infiltrate.  He was started on IV fluids, broad-spectrum antibiotics and admitted to ICU under PCCM service.  Nephrology was consulted.  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  Neurosurgery recommended conservative management.  He was extubated on 10/10/2021.  2D echo showed right atrial mass.  He was transferred to [facility] service from 10/12/2021 onwards.  Palliative care was also consulted.  He was switched to comfort measures only on 10/14/2021 by palliative care team.  Assessment & Plan:   Comfort measures only status Acute respiratory failure with hypoxia Acute metabolic encephalopathy Acute versus subacute right parietal subdural hematoma Acute kidney injury on chronic kidney disease stage IV hyperkalemia: Resolved Possible rhabdomyolysis Hypernatremia: Resolved Right atrial mass PVC/NSVT Generalized deconditioning Severe protein calorie malnutrition Anemia of chronic disease Thrombocytopenia Aerococcus in blood culture Staph capitis in blood culture BPH  Plan -As described above.  He was switched to comfort measures only on 10/14/2021 by palliative care team.  Might qualify for residential hospice.   DVT prophylaxis: None for comfort measures Code Status: DNR  family Communication: None at bedside Disposition Plan: Status is: Inpatient  Remains inpatient  appropriate because: Patient is clinically extremely ill and might qualify for residential hospice.  Consultants: PCCM/nephrology/cardiology/palliative care  Procedures: Intubation/extubation.  Echo  Antimicrobials:  Anti-infectives (From admission, onward)    Start     Dose/Rate Route Frequency Ordered Stop   10/08/21 2300  ceFEPIme (MAXIPIME) 1 g in sodium chloride 0.9 % 100 mL IVPB  Status:  Discontinued        1 g 200 mL/hr over 30 Minutes Intravenous Every 24 hours 10/07/21 2232 10/08/21 0848   10/07/21 1900  ceFEPIme (MAXIPIME) 2 g in sodium chloride 0.9 % 100 mL IVPB        2 g 200 mL/hr over 30 Minutes Intravenous  Once 10/07/21 1846 10/07/21 2014   10/07/21 1900  metroNIDAZOLE (FLAGYL) IVPB 500 mg  Status:  Discontinued        500 mg 100 mL/hr over 60 Minutes Intravenous Every 12 hours 10/07/21 1846 10/08/21 0847   10/07/21 1900  vancomycin (VANCOREADY) IVPB 1250 mg/250 mL        1,250 mg 166.7 mL/hr over 90 Minutes Intravenous  Once 10/07/21 1846 10/07/21 2234   10/07/21 1848  vancomycin variable dose per unstable renal function (pharmacist dosing)  Status:  Discontinued         Does not apply See admin instructions 10/07/21 1849 10/08/21 0847        Subjective: Patient seen and examined at bedside.  Very poor historian.  No overnight seizures, vomiting reported.   Objective: Vitals:   10/13/21 1234 10/13/21 1719 10/14/21 0651 10/14/21 0845  BP: 117/69 (!) 117/39 (!) 123/43 (!) 111/44  Pulse: 93 75 74 76  Resp: 17 17 18 16   Temp:  97.8 F (36.6 C) 97.9 F (  36.6 C) 97.6 F (36.4 C)  TempSrc:    Oral  SpO2:  (!) 74% 99%   Weight:      Height:        Intake/Output Summary (Last 24 hours) at 10/15/2021 0750 Last data filed at 10/15/2021 0200 Gross per 24 hour  Intake 340 ml  Output 75 ml  Net 265 ml    Filed Weights   10/10/21 0405 10/11/21 0500 10/11/21 2030  Weight: 57.3 kg 60.8 kg 62.9 kg    Examination:  General exam: No acute distress.  Currently  intermittently requiring 2 L oxygen by nasal cannula.  Elderly male lying in bed; wakes up only very minimally.  Data Reviewed: I have personally reviewed following labs and imaging studies  CBC: Recent Labs  Lab 10/09/21 0436 10/11/21 0417 10/12/21 0214 10/13/21 0630 10/14/21 0414  WBC 8.1 7.0 8.1 9.0 7.5  NEUTROABS 7.3  --   --  8.0* 6.3  HGB 7.4* 7.5* 7.5* 7.6* 7.1*  HCT 22.3* 23.6* 23.8* 24.0* 23.0*  MCV 88.8 91.5 91.5 91.3 92.4  PLT 83* 90* 111* 131* 131*    Basic Metabolic Panel: Recent Labs  Lab 10/08/21 1556 10/09/21 0436 10/09/21 1631 10/10/21 0302 10/11/21 0417 10/12/21 0214 10/13/21 0630 10/14/21 0414  NA  --  146*  --  143 142 141 139 139  K  --  4.4  --  4.6 4.4 4.3 4.3 4.2  CL  --  113*  --  113* 113* 111 111 113*  CO2  --  21*  --  20* 20* 21* 21* 20*  GLUCOSE  --  132*  --  129* 114* 111* 108* 109*  BUN  --  167*  --  156* 139* 124* 99* 82*  CREATININE  --  6.32*  --  5.16* 4.45* 3.93* 3.33* 2.89*  CALCIUM  --  6.5*  --  6.3* 6.8* 7.1* 7.4* 7.6*  MG 2.7* 2.4 2.3 2.1 2.2  --  2.0 2.1  PHOS 7.4* 5.3* 5.2* 5.0* 4.7*  --   --   --     GFR: Estimated Creatinine Clearance: 14.5 mL/min (A) (by C-G formula based on SCr of 2.89 mg/dL (H)). Liver Function Tests: Recent Labs  Lab 10/11/21 0417 10/13/21 0630  AST 38 48*  ALT 42 48*  ALKPHOS 27* 37*  BILITOT 0.5 0.6  PROT 4.5* 4.9*  ALBUMIN 1.8* 1.9*    No results for input(s): LIPASE, AMYLASE in the last 168 hours. No results for input(s): AMMONIA in the last 168 hours.  Coagulation Profile: No results for input(s): INR, PROTIME in the last 168 hours.  Cardiac Enzymes: No results for input(s): CKTOTAL, CKMB, CKMBINDEX, TROPONINI in the last 168 hours.  BNP (last 3 results) No results for input(s): PROBNP in the last 8760 hours. HbA1C: No results for input(s): HGBA1C in the last 72 hours. CBG: Recent Labs  Lab 10/13/21 1721 10/13/21 2338 10/14/21 0358 10/14/21 0814 10/14/21 1103   GLUCAP 93 107* 103* 93 116*    Lipid Profile: No results for input(s): CHOL, HDL, LDLCALC, TRIG, CHOLHDL, LDLDIRECT in the last 72 hours. Thyroid Function Tests: No results for input(s): TSH, T4TOTAL, FREET4, T3FREE, THYROIDAB in the last 72 hours. Anemia Panel: No results for input(s): VITAMINB12, FOLATE, FERRITIN, TIBC, IRON, RETICCTPCT in the last 72 hours. Sepsis Labs: No results for input(s): PROCALCITON, LATICACIDVEN in the last 168 hours.   Recent Results (from the past 240 hour(s))  Blood Cultures (routine x 2)  Status: Abnormal   Collection Time: 10/07/21  6:00 PM   Specimen: BLOOD RIGHT ARM  Result Value Ref Range Status   Specimen Description BLOOD RIGHT ARM  Final   Special Requests   Final    BOTTLES DRAWN AEROBIC AND ANAEROBIC Blood Culture results may not be optimal due to an inadequate volume of blood received in culture bottles   Culture  Setup Time   Final    GRAM POSITIVE COCCI IN CLUSTERS AEROBIC BOTTLE ONLY CRITICAL RESULT CALLED TO, READ BACK BY AND VERIFIED WITHTillman Sers PHARMD [id number] 10/08/21 A BROWNING    Culture (A)  Final    STAPHYLOCOCCUS CAPITIS THE SIGNIFICANCE OF ISOLATING THIS ORGANISM FROM A SINGLE SET OF BLOOD CULTURES WHEN MULTIPLE SETS ARE DRAWN IS UNCERTAIN. PLEASE NOTIFY THE MICROBIOLOGY DEPARTMENT WITHIN ONE WEEK IF SPECIATION AND SENSITIVITIES ARE REQUIRED. Performed at [facility], [place] [address] [place], [place] [address]    Report Status 10/09/2021 FINAL  Final  Blood Culture ID Panel (Reflexed)     Status: Abnormal   Collection Time: 10/07/21  6:00 PM  Result Value Ref Range Status   Enterococcus faecalis NOT DETECTED NOT DETECTED Final   Enterococcus Faecium NOT DETECTED NOT DETECTED Final   Listeria monocytogenes NOT DETECTED NOT DETECTED Final   Staphylococcus species DETECTED (A) NOT DETECTED Final    Comment: CRITICAL RESULT CALLED TO, READ BACK BY AND VERIFIED WITHTillman Sers PHARMD [id number] 10/08/21 A BROWNING     Staphylococcus aureus (BCID) NOT DETECTED NOT DETECTED Final   Staphylococcus epidermidis NOT DETECTED NOT DETECTED Final   Staphylococcus lugdunensis NOT DETECTED NOT DETECTED Final   Streptococcus species NOT DETECTED NOT DETECTED Final   Streptococcus agalactiae NOT DETECTED NOT DETECTED Final   Streptococcus pneumoniae NOT DETECTED NOT DETECTED Final   Streptococcus pyogenes NOT DETECTED NOT DETECTED Final   A.calcoaceticus-baumannii NOT DETECTED NOT DETECTED Final   Bacteroides fragilis NOT DETECTED NOT DETECTED Final   Enterobacterales NOT DETECTED NOT DETECTED Final   Enterobacter cloacae complex NOT DETECTED NOT DETECTED Final   Escherichia coli NOT DETECTED NOT DETECTED Final   Klebsiella aerogenes NOT DETECTED NOT DETECTED Final   Klebsiella oxytoca NOT DETECTED NOT DETECTED Final   Klebsiella pneumoniae NOT DETECTED NOT DETECTED Final   Proteus species NOT DETECTED NOT DETECTED Final   Salmonella species NOT DETECTED NOT DETECTED Final   Serratia marcescens NOT DETECTED NOT DETECTED Final   Haemophilus influenzae NOT DETECTED NOT DETECTED Final   Neisseria meningitidis NOT DETECTED NOT DETECTED Final   Pseudomonas aeruginosa NOT DETECTED NOT DETECTED Final   Stenotrophomonas maltophilia NOT DETECTED NOT DETECTED Final   Candida albicans NOT DETECTED NOT DETECTED Final   Candida auris NOT DETECTED NOT DETECTED Final   Candida glabrata NOT DETECTED NOT DETECTED Final   Candida krusei NOT DETECTED NOT DETECTED Final   Candida parapsilosis NOT DETECTED NOT DETECTED Final   Candida tropicalis NOT DETECTED NOT DETECTED Final   Cryptococcus neoformans/gattii NOT DETECTED NOT DETECTED Final    Comment: Performed at [facility], [address] [place], [place] [address]  Urine Culture     Status: None   Collection Time: 10/07/21  6:06 PM   Specimen: Urine, Catheterized  Result Value Ref Range Status   Specimen Description URINE, CATHETERIZED  Final   Special Requests NONE   Final   Culture   Final    NO GROWTH Performed at [facility], [address], [place], [place] [address]  Report Status 10/08/2021 FINAL  Final  Resp Panel by RT-PCR (Flu A&B, Covid) Urine, Clean Catch     Status: None   Collection Time: 10/07/21  6:07 PM   Specimen: Urine, Clean Catch; Nasopharyngeal(NP) swabs in vial transport medium  Result Value Ref Range Status   SARS Coronavirus 2 by RT PCR NEGATIVE NEGATIVE Final    Comment: (NOTE) SARS-CoV-2 target nucleic acids are NOT DETECTED.  The SARS-CoV-2 RNA is generally detectable in upper respiratory specimens during the acute phase of infection. The lowest concentration of SARS-CoV-2 viral copies this assay can detect is 138 copies/mL. A negative result does not preclude SARS-Cov-2 infection and should not be used as the sole basis for treatment or other patient management decisions. A negative result may occur with  improper specimen collection/handling, submission of specimen other than nasopharyngeal swab, presence of viral mutation(s) within the areas targeted by this assay, and inadequate number of viral copies(<138 copies/mL). A negative result must be combined with clinical observations, patient history, and epidemiological information. The expected result is Negative.  Fact Sheet for Patients:  EntrepreneurPulse.com.au  Fact Sheet for Healthcare Providers:  IncredibleEmployment.be  This test is no t yet approved or cleared by the [place] FDA and  has been authorized for detection and/or diagnosis of SARS-CoV-2 by FDA under an Emergency Use Authorization (EUA). This EUA will remain  in effect (meaning this test can be used) for the duration of the COVID-19 declaration under Section 564(b)(1) of the Act, 21 U.S.C.section 360bbb-3(b)(1), unless the authorization is terminated  or revoked sooner.       Influenza A by PCR NEGATIVE NEGATIVE Final   Influenza B by PCR  NEGATIVE NEGATIVE Final    Comment: (NOTE) The Xpert Xpress SARS-CoV-2/FLU/RSV plus assay is intended as an aid in the diagnosis of influenza from Nasopharyngeal swab specimens and should not be used as a sole basis for treatment. Nasal washings and aspirates are unacceptable for Xpert Xpress SARS-CoV-2/FLU/RSV testing.  Fact Sheet for Patients: EntrepreneurPulse.com.au  Fact Sheet for Healthcare Providers: IncredibleEmployment.be  This test is not yet approved or cleared by the [place] FDA and has been authorized for detection and/or diagnosis of SARS-CoV-2 by FDA under an Emergency Use Authorization (EUA). This EUA will remain in effect (meaning this test can be used) for the duration of the COVID-19 declaration under Section 564(b)(1) of the Act, 21 U.S.C. section 360bbb-3(b)(1), unless the authorization is terminated or revoked.  Performed at [facility], [place] [address] [place], [place] [address]   Blood Cultures (routine x 2)     Status: Abnormal   Collection Time: 10/07/21  7:05 PM   Specimen: Right Antecubital; Blood  Result Value Ref Range Status   Specimen Description RIGHT ANTECUBITAL  Final   Special Requests (A)  Final    AEROCOCCUS SPECIES Blood Culture results may not be optimal due to an inadequate volume of blood received in culture bottles   Culture   Final    NO GROWTH 5 DAYS Performed at [facility], [place] [address], [place], [place] [address]    Report Status 10/12/2021 FINAL  Final  MRSA Next Gen by PCR, Nasal     Status: None   Collection Time: 10/07/21 10:13 PM   Specimen: Nasal Mucosa; Nasal Swab  Result Value Ref Range Status   MRSA by PCR Next Gen NOT DETECTED NOT DETECTED Final    Comment: (NOTE) The GeneXpert MRSA Assay (FDA approved for NASAL specimens only), is  one component of a comprehensive MRSA colonization surveillance program. It is not intended to diagnose MRSA infection nor to  guide or monitor treatment for MRSA infections. Test performance is not FDA approved in patients less than 44 years old. Performed at [facility], [place] [address], [place], [place] [address]   Culture, blood (routine x 2)     Status: None (Preliminary result)   Collection Time: 10/13/21  6:30 AM   Specimen: BLOOD  Result Value Ref Range Status   Specimen Description BLOOD RIGHT ANTECUBITAL  Final   Special Requests AEROBIC BOTTLE ONLY Blood Culture adequate volume  Final   Culture   Final    NO GROWTH 1 DAY Performed at [facility], [place] [address], [place], [place] [address]    Report Status PENDING  Incomplete  Culture, blood (routine x 2)     Status: None (Preliminary result)   Collection Time: 10/13/21  6:35 AM   Specimen: BLOOD  Result Value Ref Range Status   Specimen Description BLOOD BLOOD LEFT HAND  Final   Special Requests   Final    AEROBIC BOTTLE ONLY Blood Culture results may not be optimal due to an inadequate volume of blood received in culture bottles   Culture   Final    NO GROWTH 1 DAY Performed at [facility], [place] [address], [place],  [address]    Report Status PENDING  Incomplete          Radiology Studies: No results found.      Scheduled Meds:  Chlorhexidine Gluconate Cloth  6 each Topical Daily   mouth rinse  15 mL Mouth Rinse BID   pantoprazole  40 mg Oral QHS   Continuous Infusions:  sodium chloride Stopped (10/07/21 2059)   dextrose 5 % and 0.45% NaCl 10 mL/hr at 10/14/21 1656          Shiquita Collignon Starla Link, MD [facility] 10/15/2021, 7:50 AM

## 2021-10-15 NOTE — Progress Notes (Signed)
Patient ID: Ryan Cortez, male   DOB: 30-Aug-1927, 87 y.o.   MRN: [id number]    Progress Note from the Palliative Medicine Team at [facility]   Patient Name: Ryan Cortez        Date: 10/15/2021 DOB: 10/18/1927  Age: 87 y.o. MRN#: [id number] Attending Physician: Aline August, MD Primary Care Physician: Horald Pollen, MD Admit Date: 10/07/2021   Medical records reviewed   Per intake H&P --> 87 year old male with history of hypertension, hyperlipidemia, diabetes mellitus type 2, CKD stage IV was found in his home minimally responsive covered in dark stool.  Patient was intubated on arrival; he was found to have creatinine of 9.3, potassium of 7.2, sodium 150 and CK18 100.  Chest x-ray without significant infiltrate.  He was started on IV fluids, broad-spectrum antibiotics and admitted to ICU under PCCM service.  Nephrology was consulted.  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  Neurosurgery recommended conservative management.  He was extubated on 10/10/2021.  2D echo showed right atrial mass.   Palliative care has been consulted in the setting of chronic comorbid conditions to further discuss goals of care.  Initial palliative medicine consult was completed on 10/12/2021  Patient has had continued physical, and functional and cognitive decline over the past 6 days of this hospitalization within the context of full medical support.    Yesterday I met with patient's 2 main support persons [employer] and [place] and decision was made to shift to a full comfort path, focusing on dignity and allowing a natural death.  On exam Ryan Cortez appears comfortable today is more lethargic and taking a few sips  I spoke to [place] by phone, to offer support and again she verbalizes comfort with decision to focus on comfort versus life-prolonging for Ryan Cortez.  Education offered  on natural trajectory and expectations at EOL    Plan of care -DNR/DNI -No artificial feeding or hydration now or in the future -No further life prolonging measures; DC IV fluids and IV antibiotics, no further labs, no transfusions -Symptom management -Comfort, quality and dignity or focus of care -hopeful for residential hospice for EOL care- will place order   MOST form completed   Questions and concerns addressed   Discussed with  bedside RN  Total time spent on the unit was 35 minutes  Greater than 50% of the time was spent in counseling and coordination of care  Wadie Lessen NP  Palliative Medicine Team Team Phone # [phone] Pager [phone]

## 2021-10-16 MED ORDER — MORPHINE SULFATE (CONCENTRATE) 10 MG/0.5ML PO SOLN: 5.0000 mg | ORAL | 0 refills | Status: AC | PRN

## 2021-10-16 MED ORDER — LORAZEPAM 0.5 MG PO TABS: 0.5000 mg | ORAL_TABLET | Freq: Four times a day (QID) | ORAL | 0 refills | Status: AC | PRN

## 2021-10-16 NOTE — Progress Notes (Signed)
Nutrition Brief Note ° °Chart reviewed. °Pt now transitioning to comfort care.  °No further nutrition interventions planned at this time.  °Please re-consult as needed.  ° ° °Zayra Devito A., MS, RD, LDN (she/her/hers) °RD pager number and weekend/on-call pager number located in Amion. ° ° °

## 2021-10-16 NOTE — Progress Notes (Signed)
[facility] ([facility]) Hospital Liaison note.     This patient is approved to transfer to [facility] today.    [facility] will notify TOC when registration paperwork has been completed to arrange transport.    RN please call report to [phone].   Thank you,     Mary Anne Robertson, RN, CCM       [facility] Hospital Liaison  [phone] 

## 2021-10-16 NOTE — Progress Notes (Signed)
[facility] ([facility] Hospital Liaison note.      Received request from TOC manager for family interest in [facility]. Patient information has been forwarded to [facility] for review.  Eligibility confirmed.     [facility] Hospital Liaison will follow up tomorrow or sooner if a room becomes available.   A Please do not hesitate to call with questions.    Thank you,    Mary Anne Robertson, RN, CCM       [facility] Hospital Liaison (listed on AMION under Hospice /[facility])     [phone] 

## 2021-10-16 NOTE — Progress Notes (Incomplete Revision)
DISCHARGE NOTE SNF Ryan Cortez to be discharged [place] per MD order. Patient verbalized understanding.  Skin clean, dry and intact without evidence of skin break down, no evidence of skin tears noted. IV catheter discontinued intact. Site without signs and symptoms of complications. Dressing and pressure applied. Pt denies pain at the site currently. No complaints noted.  Patient free of lines, drains, and wounds.   Discharge packet assembled. An After Visit Summary (AVS) was printed and given to the EMS personnel. Patient escorted via stretcher and discharged to [facility] via ambulance. Report called to accepting facility; all questions and concerns addressed.   Berneta Levins, RN

## 2021-10-16 NOTE — Progress Notes (Signed)
Gave report to Nurse at [facility] place

## 2021-10-16 NOTE — Discharge Summary (Signed)
Physician Discharge Summary  Dillon Livermore GEZ:[id number] DOB: June 19, 1927 DOA: 10/07/2021  PCP: Horald Pollen, MD  Admit date: 10/07/2021 Discharge date: 10/16/2021  Time spent: 37 minutes  Recommendations for Outpatient Follow-up:  Patient going to freestanding hospice for end-of-life care  Discharge Diagnoses:  MAIN problem for hospitalization   Acute intracranial bleed  Please see below for itemized issues addressed in [place]- refer to other progress notes for clarity if needed  Discharge Condition: Guarded  Diet recommendation: Comfort  Filed Weights   10/10/21 0405 10/11/21 0500 10/11/21 2030  Weight: 57.3 kg 60.8 kg 62.9 kg    History of present illness:  73 black male HTN HLD DM TY 2 CKD 4 found down home on admission 10/07/2021 minimally responsive covered dark stool-at baseline very interactive Patient was intubated on arrival Found to have AKI creatinine 9.3 potassium 7.2-CT head showed 4 mm right parietal subdural hygroma with 6 mm acute midline shift Neurosurgery saw the patient in consult felt that he is a poor surgical candidate secondary to frailty age and CT spine was reviewed by them Further work-up revealed right atrial mass and PVCs--cardiology did not feel he was a good candidate for further work-up He was ultimately seen also by palliative care and comfort care was felt most appropriate course of action Extensive discussions were undertaken with family He was deemed a good candidate for freestanding hospice placement and was discharged in a stable state to [facility]  We have minimized his medications to his essential eyedrops and his prostate medications--all other meds have been narrowed in keeping with hospice philosophy  I had a discussion with his family on day of discharge and they are comfortable and understanding of the plan of care   Discharge Exam: Vitals:   10/15/21 0838 10/16/21 0800  BP: 108/65 116/61  Pulse: 68 74  Resp: 15    Temp: (!) 97.5 F (36.4 C) 97.6 F (36.4 C)  SpO2: 97% 100%    Subj on day of d/c   Awake coherent no pain responsive but slightly confused  General Exam on discharge  EOMI NCAT no focal deficit--left cheek wound, neck soft supple Abdomen soft no rebound no guarding no hepatosplenomegaly Chest clear no added sound no rales rhonchi ROM intact moving appropriately limbs  Discharge Instructions   Discharge Instructions     Diet - low sodium heart healthy   Complete by: As directed    Discharge wound care:   Complete by: As directed    Wound Care Orders (From admission, onward)      Start     Ordered   10/08/21 0836    Wound care  Every shift      Comments: Place foam dressings over the bilateral knee wounds, lift each shift and monitor for wound worsening. Change every 3 days and prn.  10/08/21 0836   10/08/21 0835    Wound care  Every shift      Comments: Gently wash the sacral wound with soap and water, pat dry. Place a foam dressing over the wound. Change every 3 days and prn.  10/08/21 0836   Increase activity slowly   Complete by: As directed    No wound care   Complete by: As directed       Allergies as of 10/16/2021   No Known Allergies      Medication List     STOP taking these medications    amLODipine 10 MG tablet Commonly known as: NORVASC   furosemide  40 MG tablet Commonly known as: LASIX   losartan 100 MG tablet Commonly known as: COZAAR       TAKE these medications    dorzolamide 2 % ophthalmic solution Commonly known as: TRUSOPT Place 1 drop into the left eye 2 (two) times daily.   finasteride 5 MG tablet Commonly known as: PROSCAR Take 5 mg by mouth daily.   LORazepam 0.5 MG tablet Commonly known as: ATIVAN Take 1 tablet (0.5 mg total) by mouth every 6 (six) hours as needed for anxiety.   morphine CONCENTRATE 10 MG/0.5ML Soln concentrated solution Take 0.25 mLs (5 mg total) by mouth every 2 (two) hours as needed for  shortness of breath or moderate pain.   prednisoLONE acetate 1 % ophthalmic suspension Commonly known as: PRED FORTE Place 1 drop into the right eye in the morning and at bedtime.   tamsulosin 0.4 MG Caps capsule Commonly known as: FLOMAX Take 0.4 mg by mouth daily.               Discharge Care Instructions  (From admission, onward)           Start     Ordered   10/16/21 0000  Discharge wound care:       Comments: Wound Care Orders (From admission, onward)      Start     Ordered   10/08/21 0836    Wound care  Every shift      Comments: Place foam dressings over the bilateral knee wounds, lift each shift and monitor for wound worsening. Change every 3 days and prn.  10/08/21 0836   10/08/21 0835    Wound care  Every shift      Comments: Gently wash the sacral wound with soap and water, pat dry. Place a foam dressing over the wound. Change every 3 days and prn.  10/08/21 0836   10/16/21 1519           No Known Allergies    The results of significant diagnostics from this hospitalization (including imaging, microbiology, ancillary and laboratory) are listed below for reference.    Significant Diagnostic Studies: CT HEAD WO CONTRAST  Result Date: 10/07/2021 CLINICAL DATA:  Neurologic deficit. EXAM: CT HEAD WITHOUT CONTRAST TECHNIQUE: Contiguous axial images were obtained from the base of the skull through the vertex without intravenous contrast. COMPARISON:  None. FINDINGS: Brain: Moderate age-related atrophy and chronic microvascular ischemic changes. 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 There is however a smaller high attenuating area within these collection over the right parietal lobe (22/4) concerning for an acute or subacute subdural hemorrhage. This measures approximately 4 mm in thickness. There is associated mass effect and 6 mm right to left midline shift. Vascular: No  hyperdense vessel or unexpected calcification. Skull: Normal. Negative for fracture or focal lesion. Sinuses/Orbits: The visualized paranasal sinuses and mastoid air cells are clear. Other: None IMPRESSION: 1. 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 Mass effect on the right brain with approximately 6 mm right to left midline shift. 2. Moderate age-related atrophy and chronic microvascular ischemic changes. These results were called by telephone at the time of interpretation on 10/07/2021 at 8:46 pm to Dr. Melina Copa, who verbally acknowledged these results. Electronically Signed   By: Anner Crete M.D.   On: 10/07/2021 20:52   CT CERVICAL SPINE WO CONTRAST  Result Date: 10/08/2021 CLINICAL DATA:  Fall EXAM: CT CERVICAL SPINE WITHOUT CONTRAST TECHNIQUE: Multidetector CT imaging of the cervical spine was performed without intravenous contrast. Multiplanar CT image reconstructions were also generated. COMPARISON:  None. FINDINGS: Alignment: No static subluxation. Facets are aligned. Occipital condyles and the lateral masses of C1 and C2 are normally approximated. Skull base and vertebrae: No acute fracture. Soft tissues and spinal canal: No prevertebral fluid or swelling. No visible canal hematoma. Disc levels: No advanced spinal canal or neural foraminal stenosis. Upper chest: No pneumothorax, pulmonary nodule or pleural effusion. Other: Normal visualized paraspinal cervical soft tissues. IMPRESSION: No acute fracture or static subluxation of the cervical spine. Electronically Signed   By: Ulyses Jarred M.D.   On: 10/08/2021 03:53   US RENAL  Result Date: 10/07/2021 CLINICAL DATA:  Initial evaluation for acute kidney injury. EXAM: RENAL / URINARY TRACT ULTRASOUND COMPLETE COMPARISON:  Prior CT from 11/05/2018. FINDINGS: Right Kidney: Renal measurements: 8.6 x 3.4 x 5.1 cm = volume: 76.2 mL. Right kidney is somewhat small  with markedly increased echogenicity within the renal parenchyma. Poor corticomedullary differentiation. No visible nephrolithiasis. Minimal pelviectasis without hydronephrosis. No focal renal mass. Left Kidney: Renal measurements: 8.6 x 4.9 x 4.1 cm = volume: 89.7 mL. Left kidney is somewhat small with increased echogenicity within the renal parenchyma. Poor corticomedullary differentiation. No nephrolithiasis or hydronephrosis. 1.3 x 0.8 x 0.9 cm simple cyst present at the lower pole. Additional 1.4 x 1.3 x 1.2 cm minimally complex but benign appearing cyst at the upper pole. Additional small 1.1 cm simple cyst at the upper pole. Bladder: Decompressed with a Foley catheter in place. Other: None. IMPRESSION: 1. Increased echogenicity within the renal parenchyma, consistent with medical renal disease. 2. No hydronephrosis. 3. Few benign-appearing left renal cysts measuring up to 1.4 cm as above. 4. Foley catheter in place with decompression of the bladder. Electronically Signed   By: Jeannine Boga M.D.   On: 10/07/2021 22:55   DG Chest Portable 1 View  Result Date: 10/07/2021 CLINICAL DATA:  Intubated EXAM: PORTABLE CHEST 1 VIEW COMPARISON:  None. FINDINGS: Endotracheal tube tip is about 3.8 cm superior to carina. No esophageal visible on the included portions of the chest. Lung fields are clear. Normal cardiomediastinal silhouette with aortic atherosclerosis. No pneumothorax IMPRESSION: 1. Endotracheal tube tip about 3.8 cm superior to carina 2. No visible nasogastric tube on the included portions of the chest Electronically Signed   By: Donavan Foil M.D.   On: 10/07/2021 18:45   DG Abd Portable 1V  Result Date: 10/08/2021 CLINICAL DATA:  Orogastric tube placement. EXAM: PORTABLE ABDOMEN - 1 VIEW COMPARISON:  None. FINDINGS: Orogastric tube is seen with the side port at the GE junction in the distal tip in the proximal stomach. Several mildly dilated small bowel loops are seen in the left abdomen,  with a small amount of gas in nondilated colon. This may be due to mild ileus or partial small bowel obstruction. IMPRESSION: Orogastric tube side-port at the GE junction, and distal tip in the proximal stomach. Mild ileus versus partial small bowel obstruction. Electronically Signed   By: Marlaine Hind M.D.   On: 10/08/2021 14:49   ECHOCARDIOGRAM COMPLETE  Result Date: 10/08/2021    ECHOCARDIOGRAM REPORT   Patient Name:   RODOLFO Depree Date of Exam: 10/08/2021 Medical Rec #:  [id number]    Height:       68.0 in Accession #:    [id number]   Weight:       111.6 lb  Date of Birth:  1927-05-06    BSA:          1.595 m Patient Age:    49 years     BP:           88/55 mmHg Patient Gender: M            HR:           95 bpm. Exam Location:  Inpatient Procedure: 2D Echo, Cardiac Doppler and Color Doppler Indications:    Sepsis  History:        Patient has no prior history of Echocardiogram examinations.                 Arrythmias:Tachycardia; Signs/Symptoms:Hypotension. Respiratory                 failure.  Sonographer:    Merrie Roof RDCS Referring Phys: [id number] [place]  1. There is a mass in the RA (1.2 cm x 0.8 cm) that is best seen on subcotal imaging. This could represent a very prominent eustachian valve, but cannot exclude vegetation/thrombus. The patient does not have a central line. It does not appear to be attached to the tricuspid valve. If there are clinical concerns for endocarditis would consider a TEE for better characterization.  2. Left ventricular ejection fraction, by estimation, is 60 to 65%. The left ventricle has normal function. The left ventricle has no regional wall motion abnormalities. Left ventricular diastolic parameters are indeterminate.  3. Right ventricular systolic function is normal. The right ventricular size is normal.  4. The mitral valve is grossly normal. Trivial mitral valve regurgitation. No evidence of mitral stenosis.  5. The aortic valve is tricuspid. There  is moderate calcification of the aortic valve. There is moderate thickening of the aortic valve. Aortic valve regurgitation is not visualized. Aortic valve sclerosis/calcification is present, without any evidence of aortic stenosis. FINDINGS  Left Ventricle: Left ventricular ejection fraction, by estimation, is 60 to 65%. The left ventricle has normal function. The left ventricle has no regional wall motion abnormalities. The left ventricular internal cavity size was small. There is no left ventricular hypertrophy. Left ventricular diastolic parameters are indeterminate. Right Ventricle: The right ventricular size is normal. No increase in right ventricular wall thickness. Right ventricular systolic function is normal. Left Atrium: Left atrial size was normal in size. Right Atrium: There is a mass in the RA (1.2 cm x 0.8 cm) that is best seen on subcotal imaging. This could represent a very prominent eustachian valve, but cannot exclude vegetation/thrombus. The patient does not have a central line. It does not appear to be attached to the tricuspid valve. If there are clinical concerns for endocarditis would consider a TEE for better characterization. Right atrial size was normal in size. Pericardium: Trivial pericardial effusion is present. Mitral Valve: The mitral valve is grossly normal. There is moderate thickening of the anterior and posterior mitral valve leaflet(s). Trivial mitral valve regurgitation. No evidence of mitral valve stenosis. Tricuspid Valve: The tricuspid valve is grossly normal. Tricuspid valve regurgitation is mild . No evidence of tricuspid stenosis. Aortic Valve: The aortic valve is tricuspid. There is moderate calcification of the aortic valve. There is moderate thickening of the aortic valve. Aortic valve regurgitation is not visualized. Aortic valve sclerosis/calcification is present, without any  evidence of aortic stenosis. Pulmonic Valve: The pulmonic valve was grossly normal. Pulmonic  valve regurgitation is not visualized. No evidence of pulmonic stenosis. Aorta: The aortic root is  normal in size and structure. Venous: The right lower pulmonary vein is normal. IVC assessment for right atrial pressure unable to be performed due to mechanical ventilation. IAS/Shunts: The atrial septum is grossly normal.  LEFT VENTRICLE PLAX 2D LVIDd:         2.90 cm   Diastology LVIDs:         2.10 cm   LV e' medial:    6.42 cm/s LV PW:         1.30 cm   LV E/e' medial:  11.5 LV IVS:        0.80 cm   LV e' lateral:   9.36 cm/s LVOT diam:     1.80 cm   LV E/e' lateral: 7.9 LV SV:         39 LV SV Index:   24 LVOT Area:     2.54 cm  IVC IVC diam: 2.00 cm LEFT ATRIUM           Index        RIGHT ATRIUM           Index LA diam:      2.70 cm 1.69 cm/m   RA Area:     16.20 cm LA Vol (A4C): 50.8 ml 31.86 ml/m  RA Volume:   52.00 ml  32.61 ml/m  AORTIC VALVE LVOT Vmax:   99.20 cm/s LVOT Vmean:  60.500 cm/s LVOT VTI:    0.153 m  AORTA Ao Root diam: 2.60 cm MITRAL VALVE               TRICUSPID VALVE MV Area (PHT): 2.99 cm    TR Peak grad:   37.2 mmHg MV Decel Time: 254 msec    TR Vmax:        305.00 cm/s MV E velocity: 73.90 cm/s MV A velocity: 99.30 cm/s  SHUNTS MV E/A ratio:  0.74        Systemic VTI:  0.15 m                            Systemic Diam: 1.80 cm Eleonore Chiquito MD Electronically signed by Eleonore Chiquito MD Signature Date/Time: 10/08/2021/6:14:32 PM    Final     Microbiology: Recent Results (from the past 240 hour(s))  Blood Cultures (routine x 2)     Status: Abnormal   Collection Time: 10/07/21  6:00 PM   Specimen: BLOOD RIGHT ARM  Result Value Ref Range Status   Specimen Description BLOOD RIGHT ARM  Final   Special Requests   Final    BOTTLES DRAWN AEROBIC AND ANAEROBIC Blood Culture results may not be optimal due to an inadequate volume of blood received in culture bottles   Culture  Setup Time   Final    GRAM POSITIVE COCCI IN CLUSTERS AEROBIC BOTTLE ONLY CRITICAL RESULT CALLED TO, READ  BACK BY AND VERIFIED WITHTillman Sers PHARMD 1656 10/08/21 A BROWNING    Culture (A)  Final    STAPHYLOCOCCUS CAPITIS THE SIGNIFICANCE OF ISOLATING THIS ORGANISM FROM A SINGLE SET OF BLOOD CULTURES WHEN MULTIPLE SETS ARE DRAWN IS UNCERTAIN. PLEASE NOTIFY THE MICROBIOLOGY DEPARTMENT WITHIN ONE WEEK IF SPECIATION AND SENSITIVITIES ARE REQUIRED. Performed at [facility], [place] [address], [place], [place] [address]    Report Status 10/09/2021 FINAL  Final  Blood Culture ID Panel (Reflexed)     Status: Abnormal   Collection Time: 10/07/21  6:00 PM  Result  Value Ref Range Status   Enterococcus faecalis NOT DETECTED NOT DETECTED Final   Enterococcus Faecium NOT DETECTED NOT DETECTED Final   Listeria monocytogenes NOT DETECTED NOT DETECTED Final   Staphylococcus species DETECTED (A) NOT DETECTED Final    Comment: CRITICAL RESULT CALLED TO, READ BACK BY AND VERIFIED WITHTillman Sers PHARMD [id number] 10/08/21 A BROWNING    Staphylococcus aureus (BCID) NOT DETECTED NOT DETECTED Final   Staphylococcus epidermidis NOT DETECTED NOT DETECTED Final   Staphylococcus lugdunensis NOT DETECTED NOT DETECTED Final   Streptococcus species NOT DETECTED NOT DETECTED Final   Streptococcus agalactiae NOT DETECTED NOT DETECTED Final   Streptococcus pneumoniae NOT DETECTED NOT DETECTED Final   Streptococcus pyogenes NOT DETECTED NOT DETECTED Final   A.calcoaceticus-baumannii NOT DETECTED NOT DETECTED Final   Bacteroides fragilis NOT DETECTED NOT DETECTED Final   Enterobacterales NOT DETECTED NOT DETECTED Final   Enterobacter cloacae complex NOT DETECTED NOT DETECTED Final   Escherichia coli NOT DETECTED NOT DETECTED Final   Klebsiella aerogenes NOT DETECTED NOT DETECTED Final   Klebsiella oxytoca NOT DETECTED NOT DETECTED Final   Klebsiella pneumoniae NOT DETECTED NOT DETECTED Final   Proteus species NOT DETECTED NOT DETECTED Final   Salmonella species NOT DETECTED NOT DETECTED Final   Serratia marcescens NOT  DETECTED NOT DETECTED Final   Haemophilus influenzae NOT DETECTED NOT DETECTED Final   Neisseria meningitidis NOT DETECTED NOT DETECTED Final   Pseudomonas aeruginosa NOT DETECTED NOT DETECTED Final   Stenotrophomonas maltophilia NOT DETECTED NOT DETECTED Final   Candida albicans NOT DETECTED NOT DETECTED Final   Candida auris NOT DETECTED NOT DETECTED Final   Candida glabrata NOT DETECTED NOT DETECTED Final   Candida krusei NOT DETECTED NOT DETECTED Final   Candida parapsilosis NOT DETECTED NOT DETECTED Final   Candida tropicalis NOT DETECTED NOT DETECTED Final   Cryptococcus neoformans/gattii NOT DETECTED NOT DETECTED Final    Comment: Performed at [facility], [address], [place], [place] [address]  Urine Culture     Status: None   Collection Time: 10/07/21  6:06 PM   Specimen: Urine, Catheterized  Result Value Ref Range Status   Specimen Description URINE, CATHETERIZED  Final   Special Requests NONE  Final   Culture   Final    NO GROWTH Performed at [facility], [address] [place], [place] [address]    Report Status 10/08/2021 FINAL  Final  Resp Panel by RT-PCR (Flu A&B, Covid) Urine, Clean Catch     Status: None   Collection Time: 10/07/21  6:07 PM   Specimen: Urine, Clean Catch; Nasopharyngeal(NP) swabs in vial transport medium  Result Value Ref Range Status   SARS Coronavirus 2 by RT PCR NEGATIVE NEGATIVE Final    Comment: (NOTE) SARS-CoV-2 target nucleic acids are NOT DETECTED.  The SARS-CoV-2 RNA is generally detectable in upper respiratory specimens during the acute phase of infection. The lowest concentration of SARS-CoV-2 viral copies this assay can detect is 138 copies/mL. A negative result does not preclude SARS-Cov-2 infection and should not be used as the sole basis for treatment or other patient management decisions. A negative result may occur with  improper specimen collection/handling, submission of specimen other than nasopharyngeal  swab, presence of viral mutation(s) within the areas targeted by this assay, and inadequate number of viral copies(<138 copies/mL). A negative result must be combined with clinical observations, patient history, and epidemiological information. The expected result is Negative.  Fact Sheet for Patients:  EntrepreneurPulse.com.au  Fact Sheet for Healthcare Providers:  IncredibleEmployment.be  This test is no t yet approved or cleared by the [place] FDA and  has been authorized for detection and/or diagnosis of SARS-CoV-2 by FDA under an Emergency Use Authorization (EUA). This EUA will remain  in effect (meaning this test can be used) for the duration of the COVID-19 declaration under Section 564(b)(1) of the Act, 21 U.S.C.section 360bbb-3(b)(1), unless the authorization is terminated  or revoked sooner.       Influenza A by PCR NEGATIVE NEGATIVE Final   Influenza B by PCR NEGATIVE NEGATIVE Final    Comment: (NOTE) The Xpert Xpress SARS-CoV-2/FLU/RSV plus assay is intended as an aid in the diagnosis of influenza from Nasopharyngeal swab specimens and should not be used as a sole basis for treatment. Nasal washings and aspirates are unacceptable for Xpert Xpress SARS-CoV-2/FLU/RSV testing.  Fact Sheet for Patients: EntrepreneurPulse.com.au  Fact Sheet for Healthcare Providers: IncredibleEmployment.be  This test is not yet approved or cleared by the [place] FDA and has been authorized for detection and/or diagnosis of SARS-CoV-2 by FDA under an Emergency Use Authorization (EUA). This EUA will remain in effect (meaning this test can be used) for the duration of the COVID-19 declaration under Section 564(b)(1) of the Act, 21 U.S.C. section 360bbb-3(b)(1), unless the authorization is terminated or revoked.  Performed at [facility], [place] [address], [place], [place] [address]   Blood  Cultures (routine x 2)     Status: Abnormal   Collection Time: 10/07/21  7:05 PM   Specimen: Right Antecubital; Blood  Result Value Ref Range Status   Specimen Description RIGHT ANTECUBITAL  Final   Special Requests (A)  Final    AEROCOCCUS SPECIES Blood Culture results may not be optimal due to an inadequate volume of blood received in culture bottles   Culture   Final    NO GROWTH 5 DAYS Performed at [facility], [place] [address] [place], [place] [address]    Report Status 10/12/2021 FINAL  Final  MRSA Next Gen by PCR, Nasal     Status: None   Collection Time: 10/07/21 10:13 PM   Specimen: Nasal Mucosa; Nasal Swab  Result Value Ref Range Status   MRSA by PCR Next Gen NOT DETECTED NOT DETECTED Final    Comment: (NOTE) The GeneXpert MRSA Assay (FDA approved for NASAL specimens only), is one component of a comprehensive MRSA colonization surveillance program. It is not intended to diagnose MRSA infection nor to guide or monitor treatment for MRSA infections. Test performance is not FDA approved in patients less than 40 years old. Performed at [facility], [place] [address] [place], [place] [address]   Culture, blood (routine x 2)     Status: None (Preliminary result)   Collection Time: 10/13/21  6:30 AM   Specimen: BLOOD  Result Value Ref Range Status   Specimen Description BLOOD RIGHT ANTECUBITAL  Final   Special Requests AEROBIC BOTTLE ONLY Blood Culture adequate volume  Final   Culture   Final    NO GROWTH 3 DAYS Performed at [facility], [place] [address] [place], [place] [address]    Report Status PENDING  Incomplete  Culture, blood (routine x 2)     Status: None (Preliminary result)   Collection Time: 10/13/21  6:35 AM   Specimen: BLOOD  Result Value Ref Range Status   Specimen Description BLOOD BLOOD LEFT HAND  Final   Special Requests  Final    AEROBIC BOTTLE ONLY Blood Culture results may not be optimal due to an inadequate volume of blood received  in culture bottles   Culture   Final    NO GROWTH 3 DAYS Performed at [facility], [place] [address], [place], [place] [address]    Report Status PENDING  Incomplete     Labs: Basic Metabolic Panel: Recent Labs  Lab 10/09/21 1631 10/10/21 0302 10/11/21 0417 10/12/21 0214 10/13/21 0630 10/14/21 0414  NA  --  143 142 141 139 139  K  --  4.6 4.4 4.3 4.3 4.2  CL  --  113* 113* 111 111 113*  CO2  --  20* 20* 21* 21* 20*  GLUCOSE  --  129* 114* 111* 108* 109*  BUN  --  156* 139* 124* 99* 82*  CREATININE  --  5.16* 4.45* 3.93* 3.33* 2.89*  CALCIUM  --  6.3* 6.8* 7.1* 7.4* 7.6*  MG 2.3 2.1 2.2  --  2.0 2.1  PHOS 5.2* 5.0* 4.7*  --   --   --    Liver Function Tests: Recent Labs  Lab 10/11/21 0417 10/13/21 0630  AST 38 48*  ALT 42 48*  ALKPHOS 27* 37*  BILITOT 0.5 0.6  PROT 4.5* 4.9*  ALBUMIN 1.8* 1.9*   No results for input(s): LIPASE, AMYLASE in the last 168 hours. No results for input(s): AMMONIA in the last 168 hours. CBC: Recent Labs  Lab 10/11/21 0417 10/12/21 0214 10/13/21 0630 10/14/21 0414  WBC 7.0 8.1 9.0 7.5  NEUTROABS  --   --  8.0* 6.3  HGB 7.5* 7.5* 7.6* 7.1*  HCT 23.6* 23.8* 24.0* 23.0*  MCV 91.5 91.5 91.3 92.4  PLT 90* 111* 131* 131*   Cardiac Enzymes: No results for input(s): CKTOTAL, CKMB, CKMBINDEX, TROPONINI in the last 168 hours. BNP: BNP (last 3 results) No results for input(s): BNP in the last 8760 hours.  ProBNP (last 3 results) No results for input(s): PROBNP in the last 8760 hours.  CBG: Recent Labs  Lab 10/13/21 1721 10/13/21 2338 10/14/21 0358 10/14/21 0814 10/14/21 1103  GLUCAP 93 107* 103* 93 116*       Signed:  Nita Sells MD   [facility] 10/16/2021, 3:20 PM

## 2021-10-16 NOTE — Plan of Care (Signed)
  Problem: Elimination: Goal: Will not experience complications related to bowel motility Outcome: Progressing   Problem: Nutrition: Goal: Adequate nutrition will be maintained Outcome: Not Progressing

## 2021-10-16 NOTE — TOC Transition Note (Signed)
Transition of Care [facility]) - CM/SW Discharge Note   Patient Details  Name: Ryan Cortez MRN: [id number] Date of Birth: 07-21-1927  Transition of [facility]) CM/SW Contact:  Tom-Johnson, Renea Ee, RN Phone Number: 10/16/2021, 4:29 PM   Clinical Narrative:    Patient is discharged to [facility]. Daughter, [place] in room. CM called [facility] for transportation. No further TOC needs noted.   Final next level of care: [facility]) Barriers to Discharge: Barriers Resolved   Patient Goals and CMS Choice Patient states their goals for this hospitalization and ongoing recovery are:: To [facility] CMS Medicare.gov Compare Post Acute Care list provided to:: Patient Choice offered to / list presented to : Patient, Adult Children (Daughter, [place])  Discharge Placement              Patient chooses bed at: Other - please specify in the comment section below: [facility]) Patient to be transferred to facility by: [facility] Name of family member notified: Charolotte Eke, Daughter Patient and family notified of of transfer: 10/16/21  Discharge Plan and Services In-house Referral: Clinical Social Work   Post Acute Care Choice:  (TBD)          DME Arranged: N/A DME Agency: NA       HH Arranged: NA HH Agency: NA        Social Determinants of Health (SDOH) Interventions     Readmission Risk Interventions No flowsheet data found.

## 2021-10-16 NOTE — Progress Notes (Signed)
DISCHARGE NOTE [facility] to be discharged Skilled nursing facility per MD order. Patient verbalized understanding.  Skin clean, dry and intact without evidence of skin break down, no evidence of skin tears noted. IV catheter discontinued intact. Site without signs and symptoms of complications. Dressing and pressure applied. Pt denies pain at the site currently. No complaints noted.  Patient free of lines, drains, and wounds.   Discharge packet assembled. An After Visit Summary (AVS) was printed and given to the EMS personnel. Patient escorted via stretcher and discharged to [facility] via ambulance. Report called to accepting facility; all questions and concerns addressed.   Berneta Levins, RN

## 2021-10-18 LAB — CULTURE, BLOOD (ROUTINE X 2)
Culture: NO GROWTH
Culture: NO GROWTH
Special Requests: ADEQUATE

## 2021-11-10 DIAGNOSIS — I10 Essential (primary) hypertension: Secondary | ICD-10-CM | POA: Diagnosis not present

## 2021-11-10 DIAGNOSIS — E119 Type 2 diabetes mellitus without complications: Secondary | ICD-10-CM | POA: Diagnosis not present

## 2021-11-10 DIAGNOSIS — Z6821 Body mass index (BMI) 21.0-21.9, adult: Secondary | ICD-10-CM | POA: Diagnosis not present

## 2021-11-10 DIAGNOSIS — G9340 Encephalopathy, unspecified: Secondary | ICD-10-CM | POA: Diagnosis not present

## 2021-11-10 DIAGNOSIS — M6282 Rhabdomyolysis: Secondary | ICD-10-CM | POA: Diagnosis not present

## 2021-11-10 DIAGNOSIS — S065XAD Traumatic subdural hemorrhage with loss of consciousness status unknown, subsequent encounter: Secondary | ICD-10-CM | POA: Diagnosis not present

## 2021-11-10 DIAGNOSIS — E785 Hyperlipidemia, unspecified: Secondary | ICD-10-CM | POA: Diagnosis not present

## 2021-11-10 DIAGNOSIS — N179 Acute kidney failure, unspecified: Secondary | ICD-10-CM | POA: Diagnosis not present

## 2021-11-15 DIAGNOSIS — L89890 Pressure ulcer of other site, unstageable: Secondary | ICD-10-CM | POA: Diagnosis not present

## 2021-11-15 DIAGNOSIS — L8990 Pressure ulcer of unspecified site, unspecified stage: Secondary | ICD-10-CM | POA: Diagnosis not present

## 2021-11-15 DIAGNOSIS — J9690 Respiratory failure, unspecified, unspecified whether with hypoxia or hypercapnia: Secondary | ICD-10-CM | POA: Diagnosis not present

## 2021-11-15 DIAGNOSIS — R159 Full incontinence of feces: Secondary | ICD-10-CM | POA: Diagnosis not present

## 2021-11-15 DIAGNOSIS — K5900 Constipation, unspecified: Secondary | ICD-10-CM | POA: Diagnosis not present

## 2021-11-15 DIAGNOSIS — G9340 Encephalopathy, unspecified: Secondary | ICD-10-CM | POA: Diagnosis not present

## 2021-11-15 DIAGNOSIS — N179 Acute kidney failure, unspecified: Secondary | ICD-10-CM | POA: Diagnosis not present

## 2021-11-15 DIAGNOSIS — I10 Essential (primary) hypertension: Secondary | ICD-10-CM | POA: Diagnosis not present

## 2021-11-15 DIAGNOSIS — E119 Type 2 diabetes mellitus without complications: Secondary | ICD-10-CM | POA: Diagnosis not present

## 2021-11-15 DIAGNOSIS — M6282 Rhabdomyolysis: Secondary | ICD-10-CM | POA: Diagnosis not present

## 2021-11-15 DIAGNOSIS — M6281 Muscle weakness (generalized): Secondary | ICD-10-CM | POA: Diagnosis not present

## 2021-11-15 DIAGNOSIS — S065XAD Traumatic subdural hemorrhage with loss of consciousness status unknown, subsequent encounter: Secondary | ICD-10-CM | POA: Diagnosis not present

## 2021-11-15 DIAGNOSIS — I6200 Nontraumatic subdural hemorrhage, unspecified: Secondary | ICD-10-CM | POA: Diagnosis not present

## 2021-11-15 DIAGNOSIS — L89150 Pressure ulcer of sacral region, unstageable: Secondary | ICD-10-CM | POA: Diagnosis not present

## 2021-11-15 DIAGNOSIS — L89322 Pressure ulcer of left buttock, stage 2: Secondary | ICD-10-CM | POA: Diagnosis not present

## 2021-11-20 DIAGNOSIS — L89890 Pressure ulcer of other site, unstageable: Secondary | ICD-10-CM | POA: Diagnosis not present

## 2021-11-20 DIAGNOSIS — J9690 Respiratory failure, unspecified, unspecified whether with hypoxia or hypercapnia: Secondary | ICD-10-CM | POA: Diagnosis not present

## 2021-11-20 DIAGNOSIS — K5900 Constipation, unspecified: Secondary | ICD-10-CM | POA: Diagnosis not present

## 2021-11-20 DIAGNOSIS — M6281 Muscle weakness (generalized): Secondary | ICD-10-CM | POA: Diagnosis not present

## 2021-11-20 DIAGNOSIS — I6200 Nontraumatic subdural hemorrhage, unspecified: Secondary | ICD-10-CM | POA: Diagnosis not present

## 2021-11-20 DIAGNOSIS — L89150 Pressure ulcer of sacral region, unstageable: Secondary | ICD-10-CM | POA: Diagnosis not present

## 2021-11-20 DIAGNOSIS — L8990 Pressure ulcer of unspecified site, unspecified stage: Secondary | ICD-10-CM | POA: Diagnosis not present

## 2021-11-20 DIAGNOSIS — N179 Acute kidney failure, unspecified: Secondary | ICD-10-CM | POA: Diagnosis not present

## 2021-11-20 DIAGNOSIS — L89322 Pressure ulcer of left buttock, stage 2: Secondary | ICD-10-CM | POA: Diagnosis not present

## 2021-11-21 DIAGNOSIS — J9690 Respiratory failure, unspecified, unspecified whether with hypoxia or hypercapnia: Secondary | ICD-10-CM | POA: Diagnosis not present

## 2021-11-21 DIAGNOSIS — K5900 Constipation, unspecified: Secondary | ICD-10-CM | POA: Diagnosis not present

## 2021-11-21 DIAGNOSIS — L8990 Pressure ulcer of unspecified site, unspecified stage: Secondary | ICD-10-CM | POA: Diagnosis not present

## 2021-11-21 DIAGNOSIS — I6200 Nontraumatic subdural hemorrhage, unspecified: Secondary | ICD-10-CM | POA: Diagnosis not present

## 2021-11-27 DIAGNOSIS — J9690 Respiratory failure, unspecified, unspecified whether with hypoxia or hypercapnia: Secondary | ICD-10-CM | POA: Diagnosis not present

## 2021-11-27 DIAGNOSIS — L8990 Pressure ulcer of unspecified site, unspecified stage: Secondary | ICD-10-CM | POA: Diagnosis not present

## 2021-11-27 DIAGNOSIS — R159 Full incontinence of feces: Secondary | ICD-10-CM | POA: Diagnosis not present

## 2021-11-27 DIAGNOSIS — N179 Acute kidney failure, unspecified: Secondary | ICD-10-CM | POA: Diagnosis not present

## 2021-11-27 DIAGNOSIS — L89150 Pressure ulcer of sacral region, unstageable: Secondary | ICD-10-CM | POA: Diagnosis not present

## 2021-11-27 DIAGNOSIS — K5900 Constipation, unspecified: Secondary | ICD-10-CM | POA: Diagnosis not present

## 2021-11-27 DIAGNOSIS — L89890 Pressure ulcer of other site, unstageable: Secondary | ICD-10-CM | POA: Diagnosis not present

## 2021-11-27 DIAGNOSIS — I6200 Nontraumatic subdural hemorrhage, unspecified: Secondary | ICD-10-CM | POA: Diagnosis not present

## 2021-11-27 DIAGNOSIS — M6281 Muscle weakness (generalized): Secondary | ICD-10-CM | POA: Diagnosis not present

## 2021-11-27 DIAGNOSIS — L89322 Pressure ulcer of left buttock, stage 2: Secondary | ICD-10-CM | POA: Diagnosis not present

## 2021-12-11 DEATH — deceased

## 2023-07-01 IMAGING — US US RENAL
1 series · 13 of 25 positions shown · non-contrast
Comparison: Prior CT from 11/05/2018.

CLINICAL DATA: Initial evaluation for acute kidney injury.

EXAM:
RENAL / URINARY TRACT ULTRASOUND COMPLETE

[Series 1: us renal · 45 acquisitions, 13 frames shown]
[im 1/45]
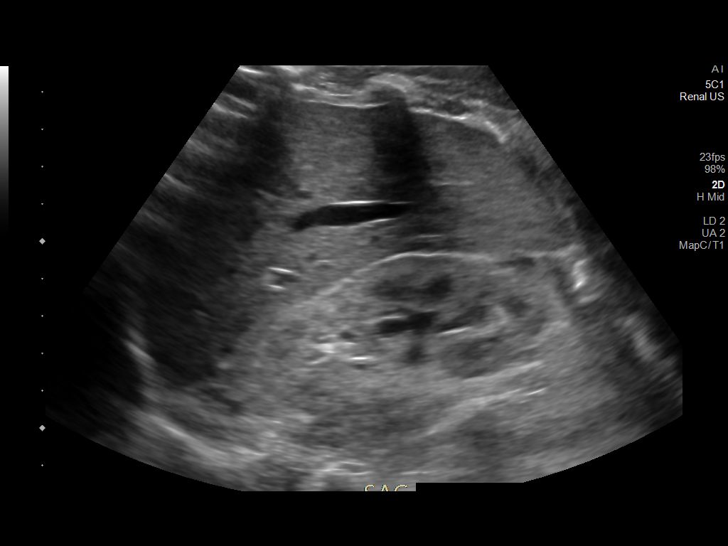
[im 4/45]
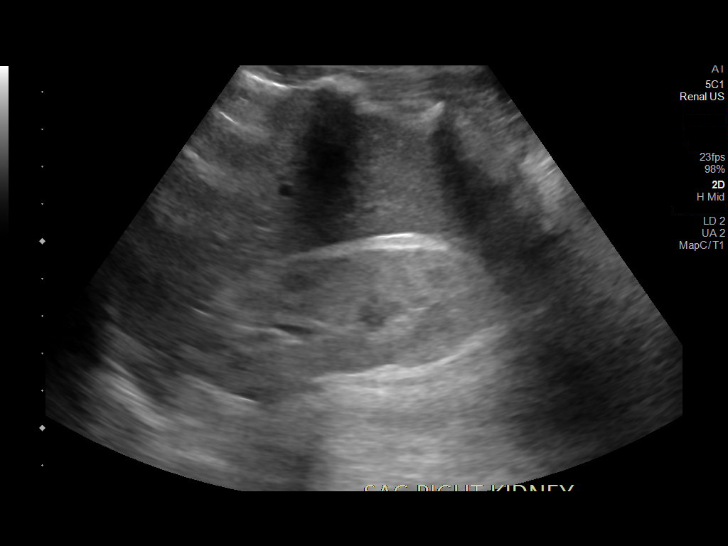
[im 8/45]
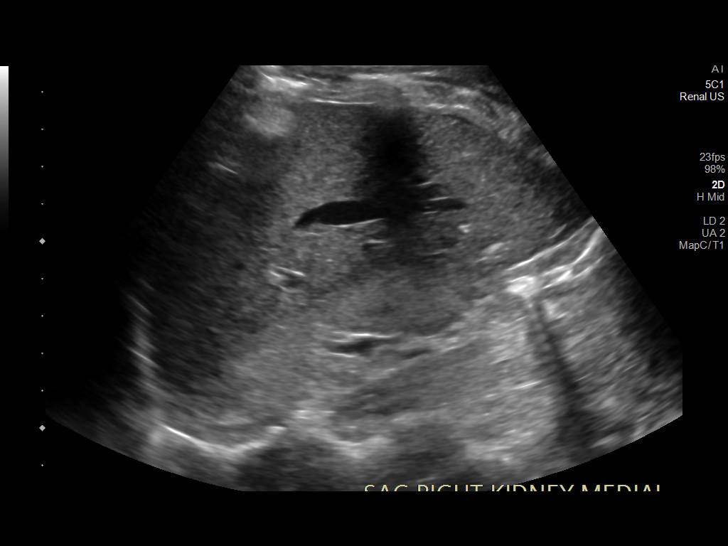
[im 12/45]
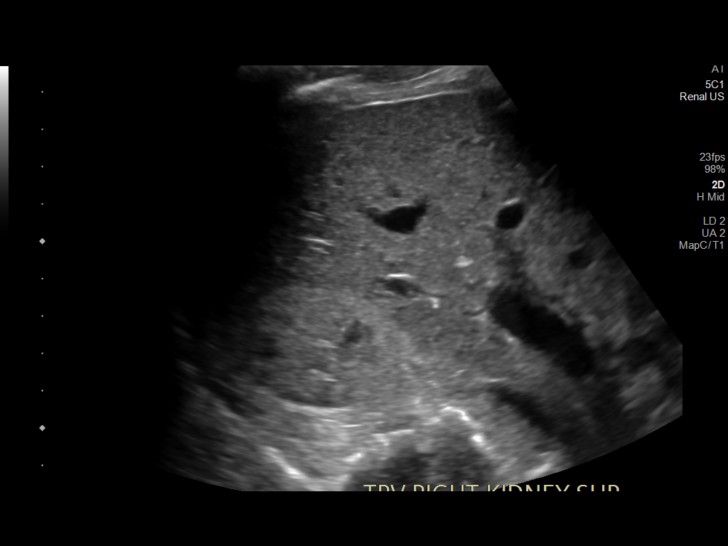
[im 15/45]
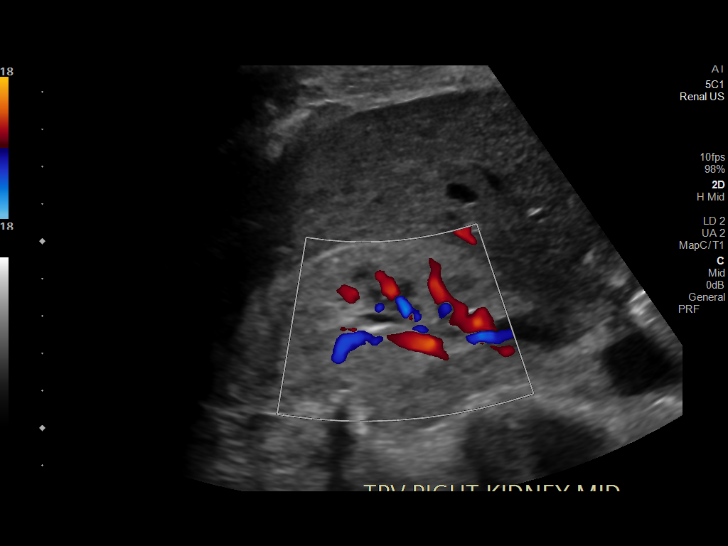
[im 19/45]
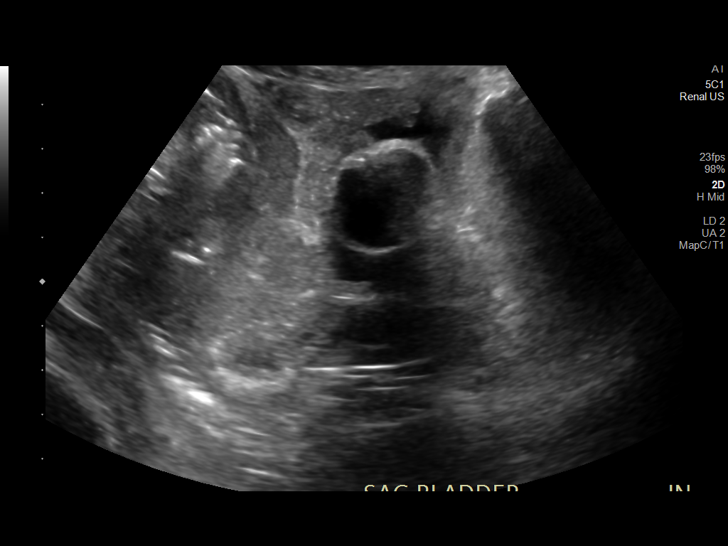
[im 23/45]
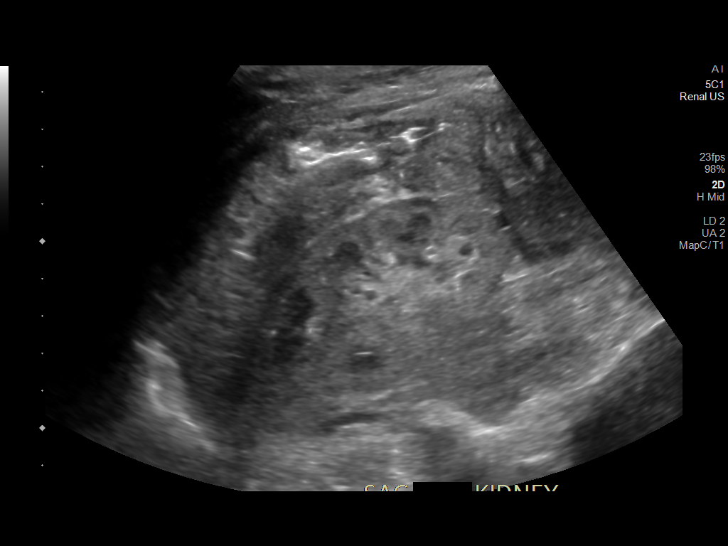
[im 26/45]
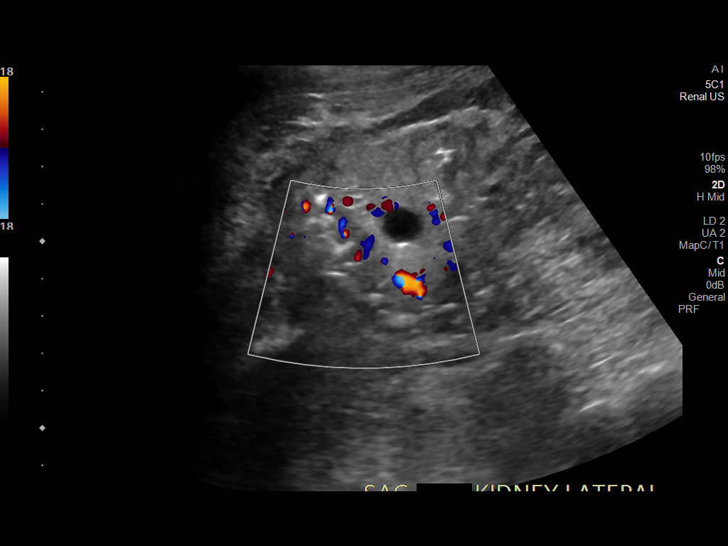
[im 30/45]
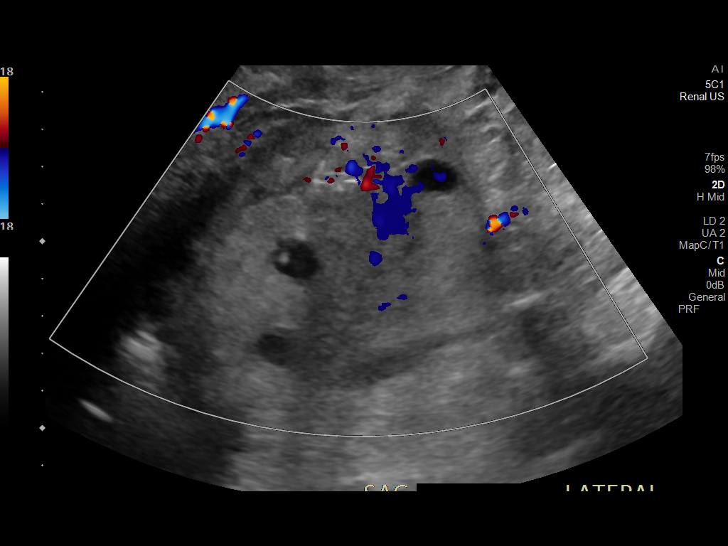
[im 34/45]
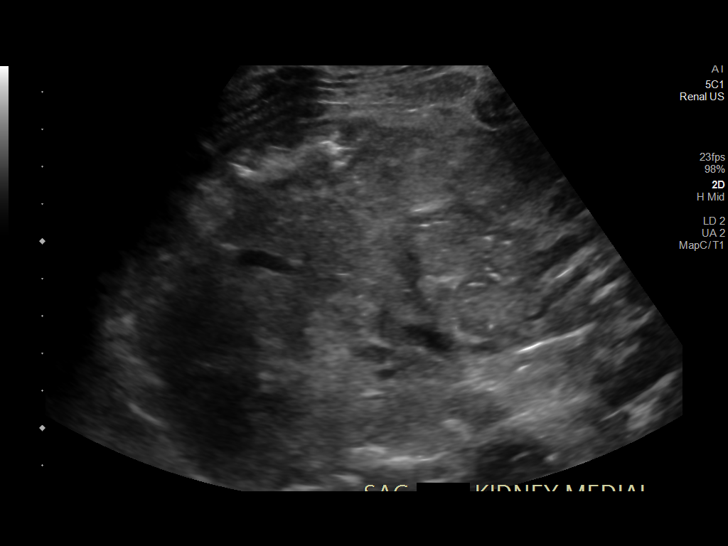
[im 37/45]
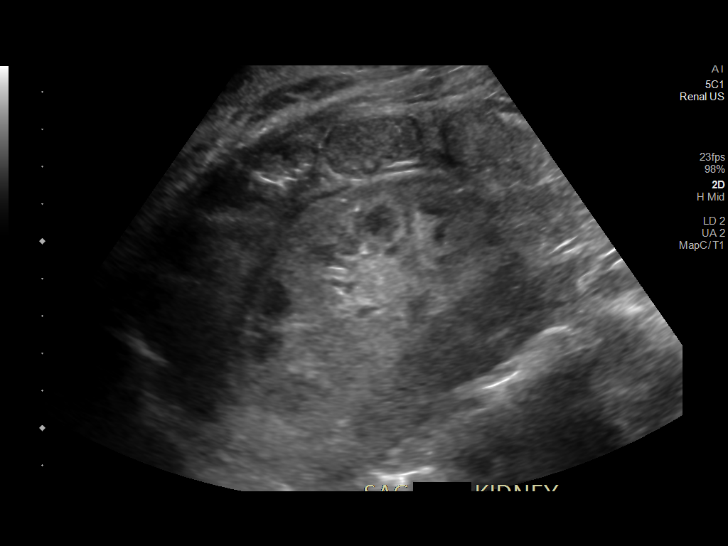
[im 41/45]
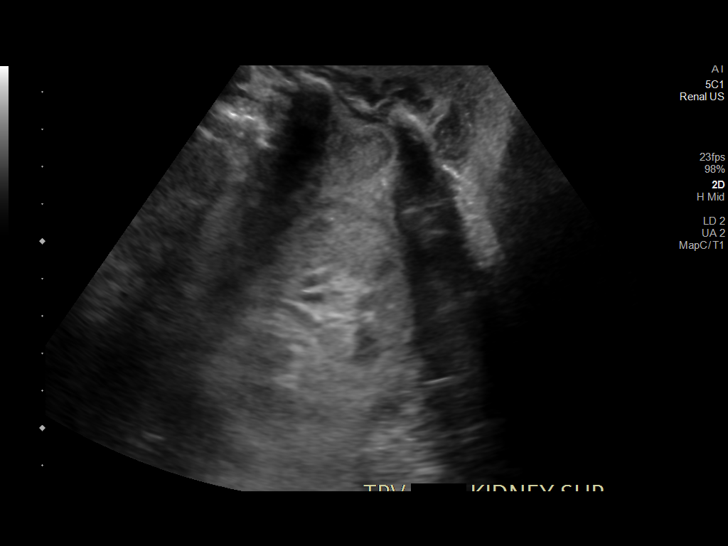
[im 45/45]
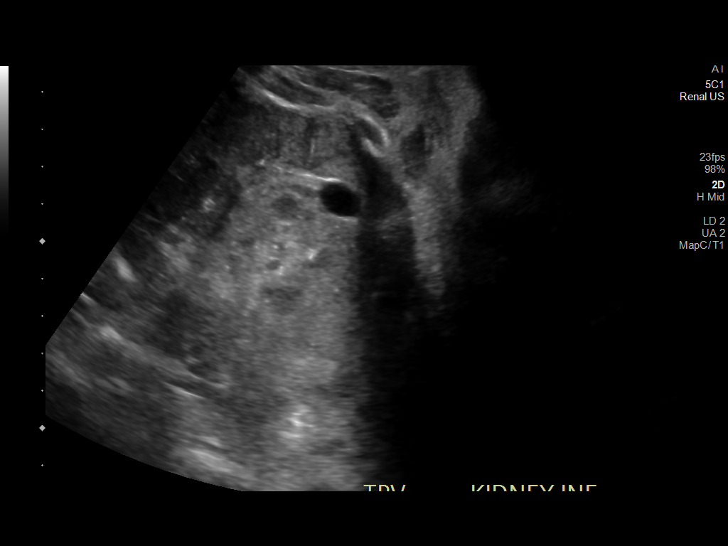

[13 of 25 positions shown; findings below may reference images not displayed]

FINDINGS: Right Kidney:

Renal measurements: 8.6 x 3.4 x 5.1 cm = volume: 76.2 mL. Right
kidney is somewhat small with markedly increased echogenicity within
the renal parenchyma. Poor corticomedullary differentiation. No
visible nephrolithiasis. Minimal pelviectasis without
hydronephrosis. No focal renal mass.

Left Kidney:

Renal measurements: 8.6 x 4.9 x 4.1 cm = volume: 89.7 mL. Left
kidney is somewhat small with increased echogenicity within the
renal parenchyma. Poor corticomedullary differentiation. No
nephrolithiasis or hydronephrosis. 1.3 x 0.8 x 0.9 cm simple cyst
present at the lower pole. Additional 1.4 x 1.3 x 1.2 cm minimally
complex but benign appearing cyst at the upper pole. Additional
small 1.1 cm simple cyst at the upper pole.

Bladder:

Decompressed with a Foley catheter in place.

Other:

None.
IMPRESSION: 1. Increased echogenicity within the renal parenchyma, consistent
with medical renal disease.
2. No hydronephrosis.
3. Few benign-appearing left renal cysts measuring up to 1.4 cm as
above.
4. Foley catheter in place with decompression of the bladder.

## 2023-07-02 IMAGING — CT CT CERVICAL SPINE W/O CM
3 of 4 series · 12 of 33 positions shown, 14 images · non-contrast
Comparison: None.

CLINICAL DATA: Fall

EXAM:
CT CERVICAL SPINE WITHOUT CONTRAST
TECHNIQUE: Multidetector CT imaging of the cervical spine was performed without
intravenous contrast. Multiplanar CT image reconstructions were also
generated.

[Series 4: c_spine 2.0 st · axial · 0.37mm/px · z∈[-206,-70]mm · 4 of 104 slices shown, 5 images]
[im 18/104  soft-tissue]
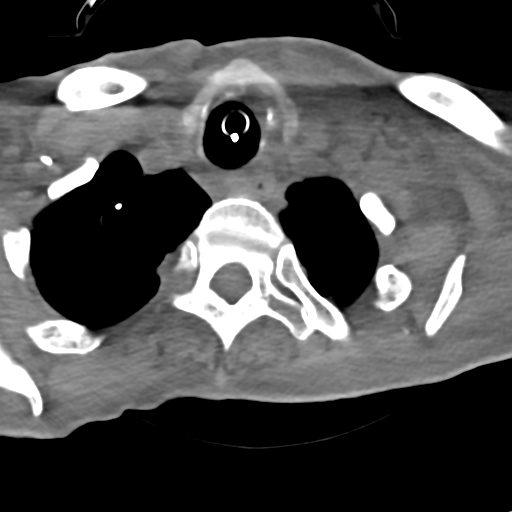
[im 18/104  bone]
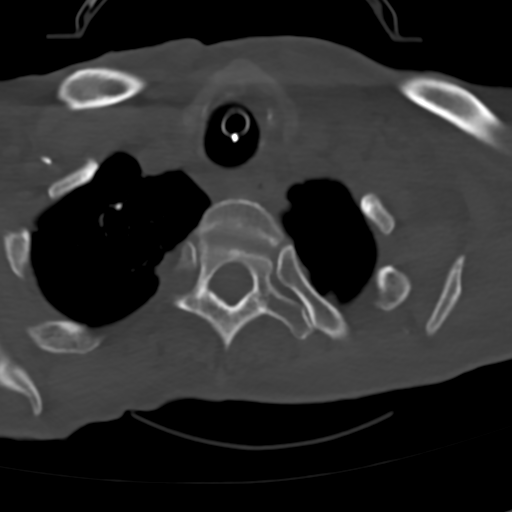
[im 35/104  bone]
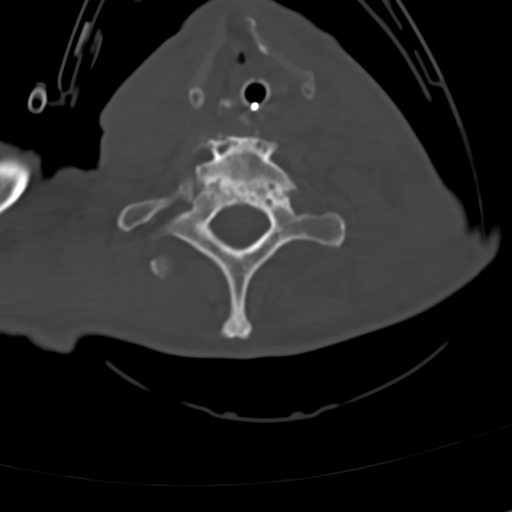
[im 69/104  bone]
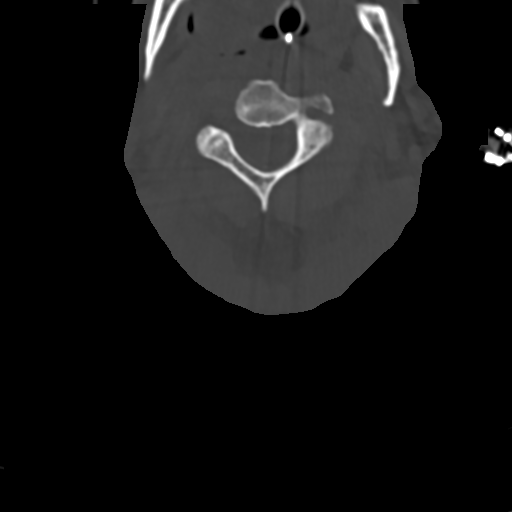
[im 86/104  bone]
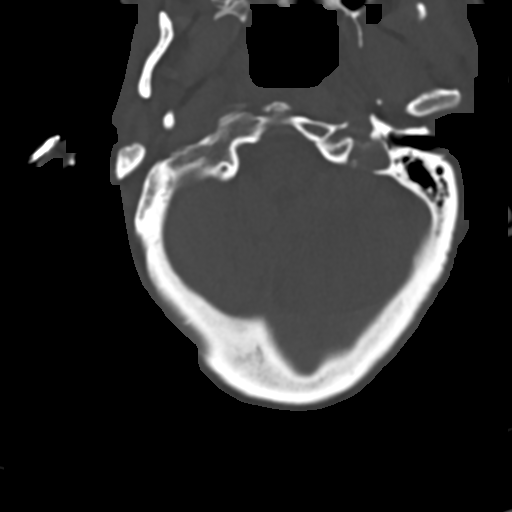

[Series 8: c_spine 2.0 sag bone · sagittal · 0.30mm/px · 5 of 61 slices shown, 6 images]
[im 21/61  bone]
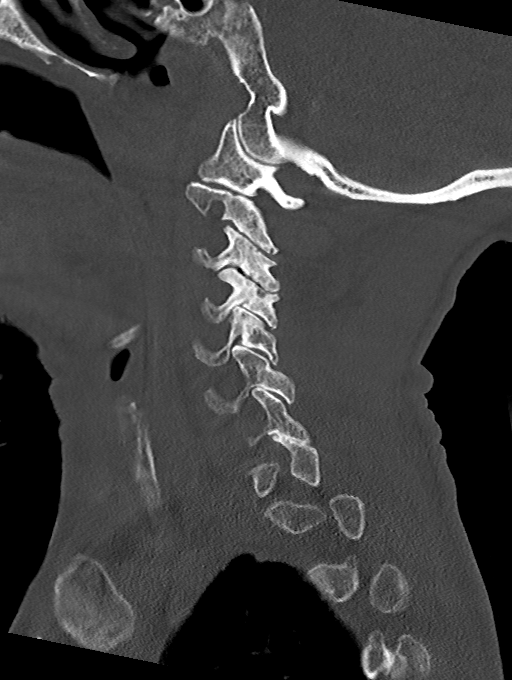
[im 26/61  bone]
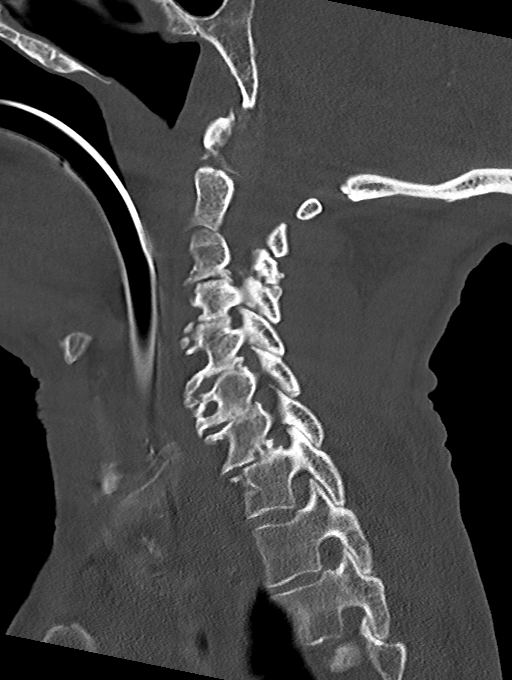
[im 31/61  soft-tissue]
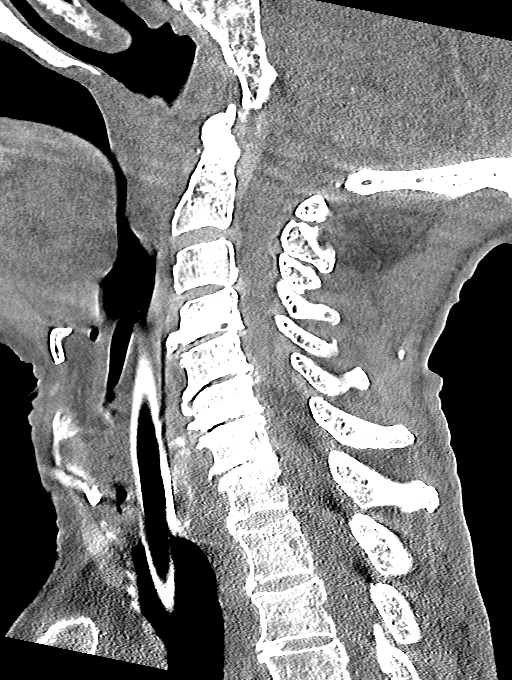
[im 31/61  bone]
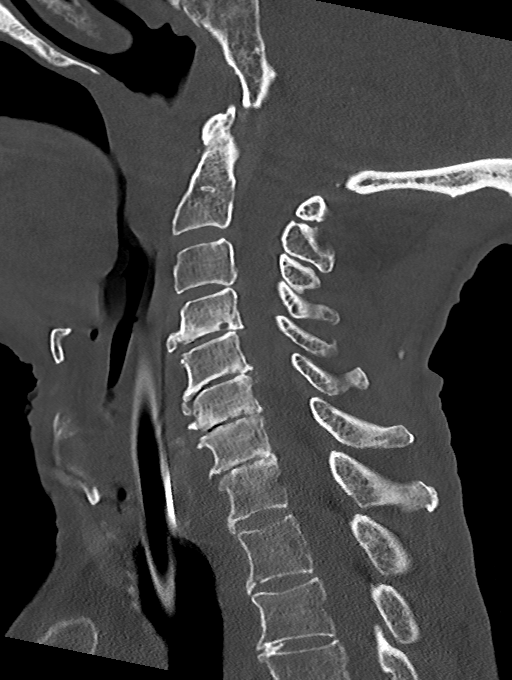
[im 36/61  bone]
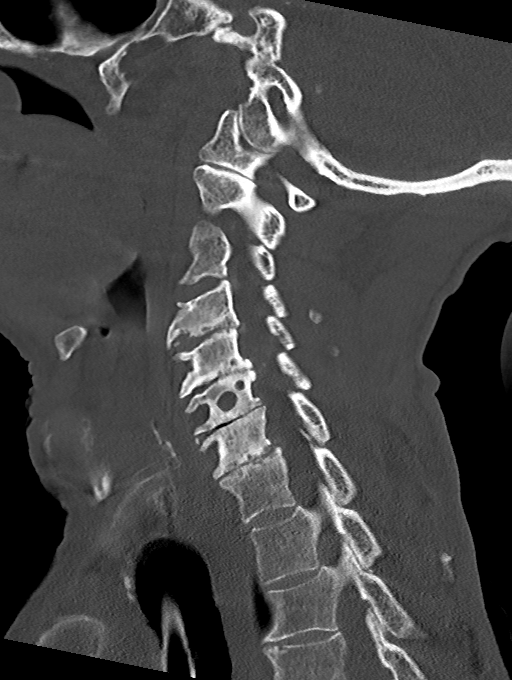
[im 41/61  bone]
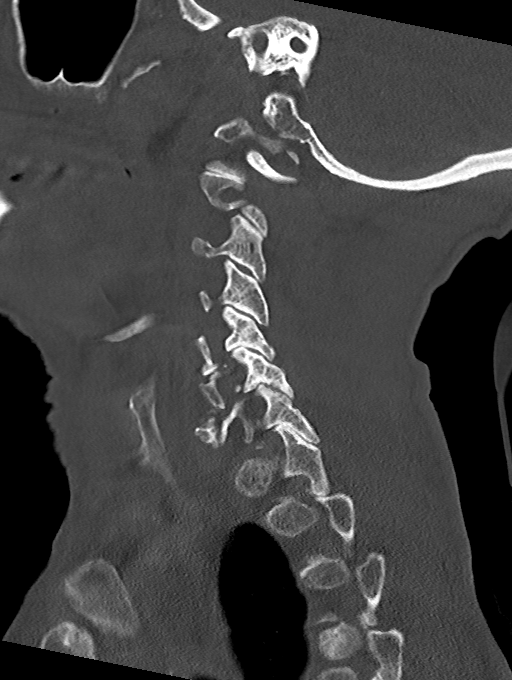

[Series 9: c_spine 2.0 cor bone · coronal · 0.23mm/px · 3 of 78 slices shown]
[im 16/78  bone]
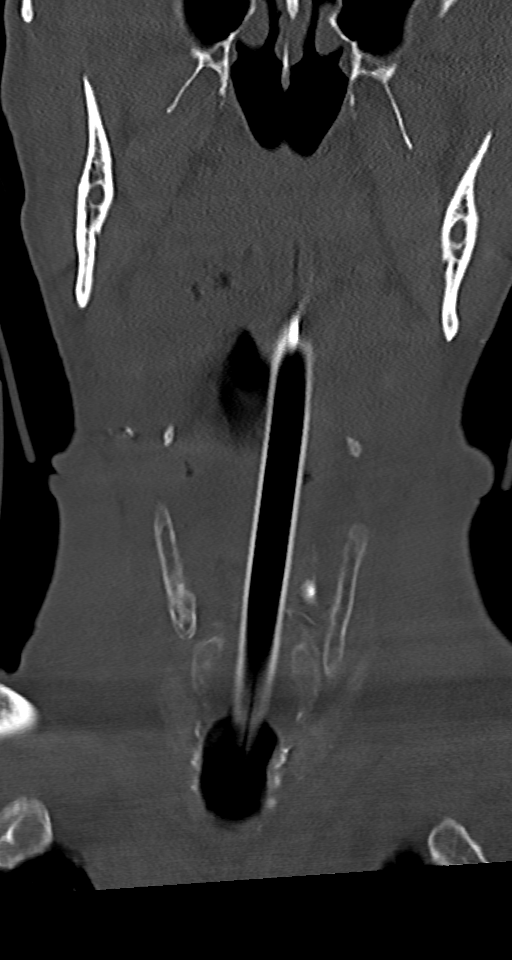
[im 31/78  bone]
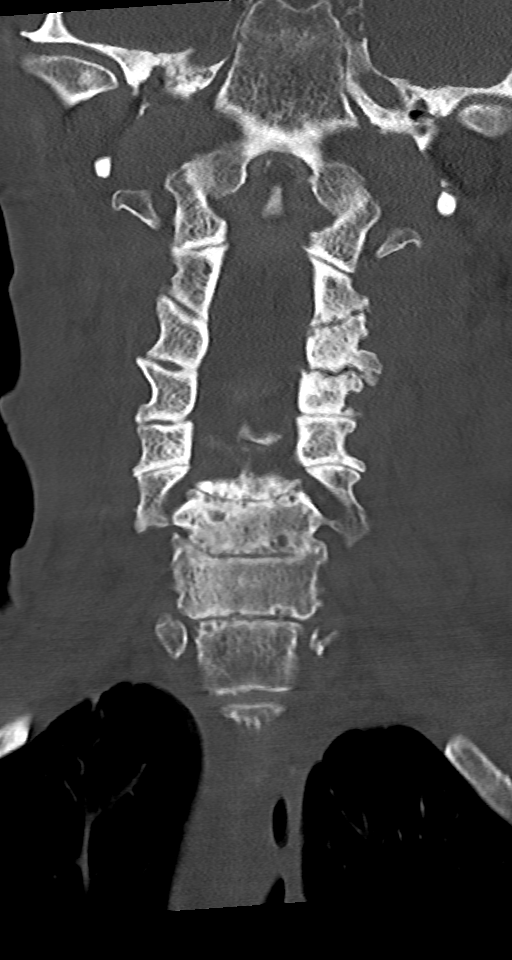
[im 47/78  bone]
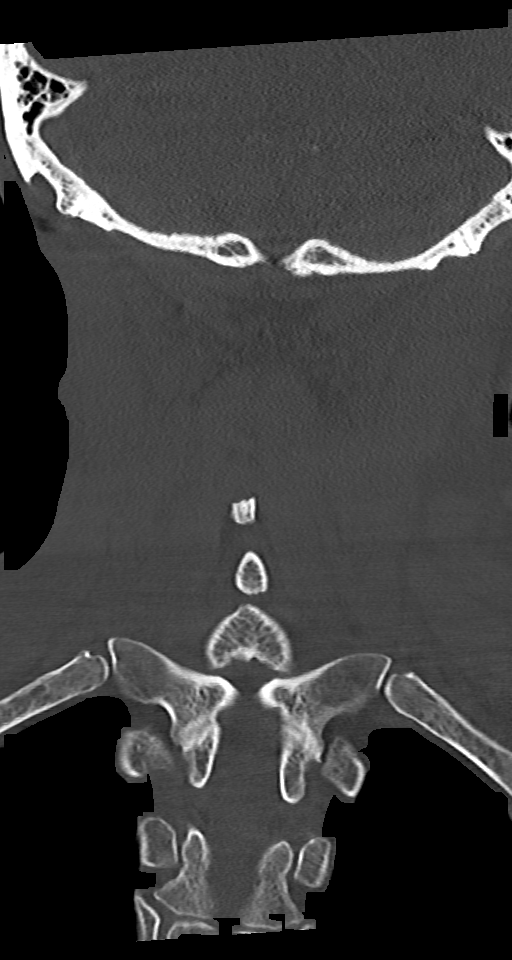

[12 of 33 positions shown; findings below may reference images not displayed]

FINDINGS: Alignment: No static subluxation. Facets are aligned. Occipital
condyles and the lateral masses of C1 and C2 are normally
approximated.

Skull base and vertebrae: No acute fracture.

Soft tissues and spinal canal: No prevertebral fluid or swelling. No
visible canal hematoma.

Disc levels: No advanced spinal canal or neural foraminal stenosis.

Upper chest: No pneumothorax, pulmonary nodule or pleural effusion.

Other: Normal visualized paraspinal cervical soft tissues.
IMPRESSION: No acute fracture or static subluxation of the cervical spine.

## 2023-07-02 IMAGING — DX DG ABD PORTABLE 1V
1 series · 1 of 1 positions shown · non-contrast
Comparison: None.

CLINICAL DATA: Orogastric tube placement.

EXAM:
PORTABLE ABDOMEN - 1 VIEW

[abdomen]
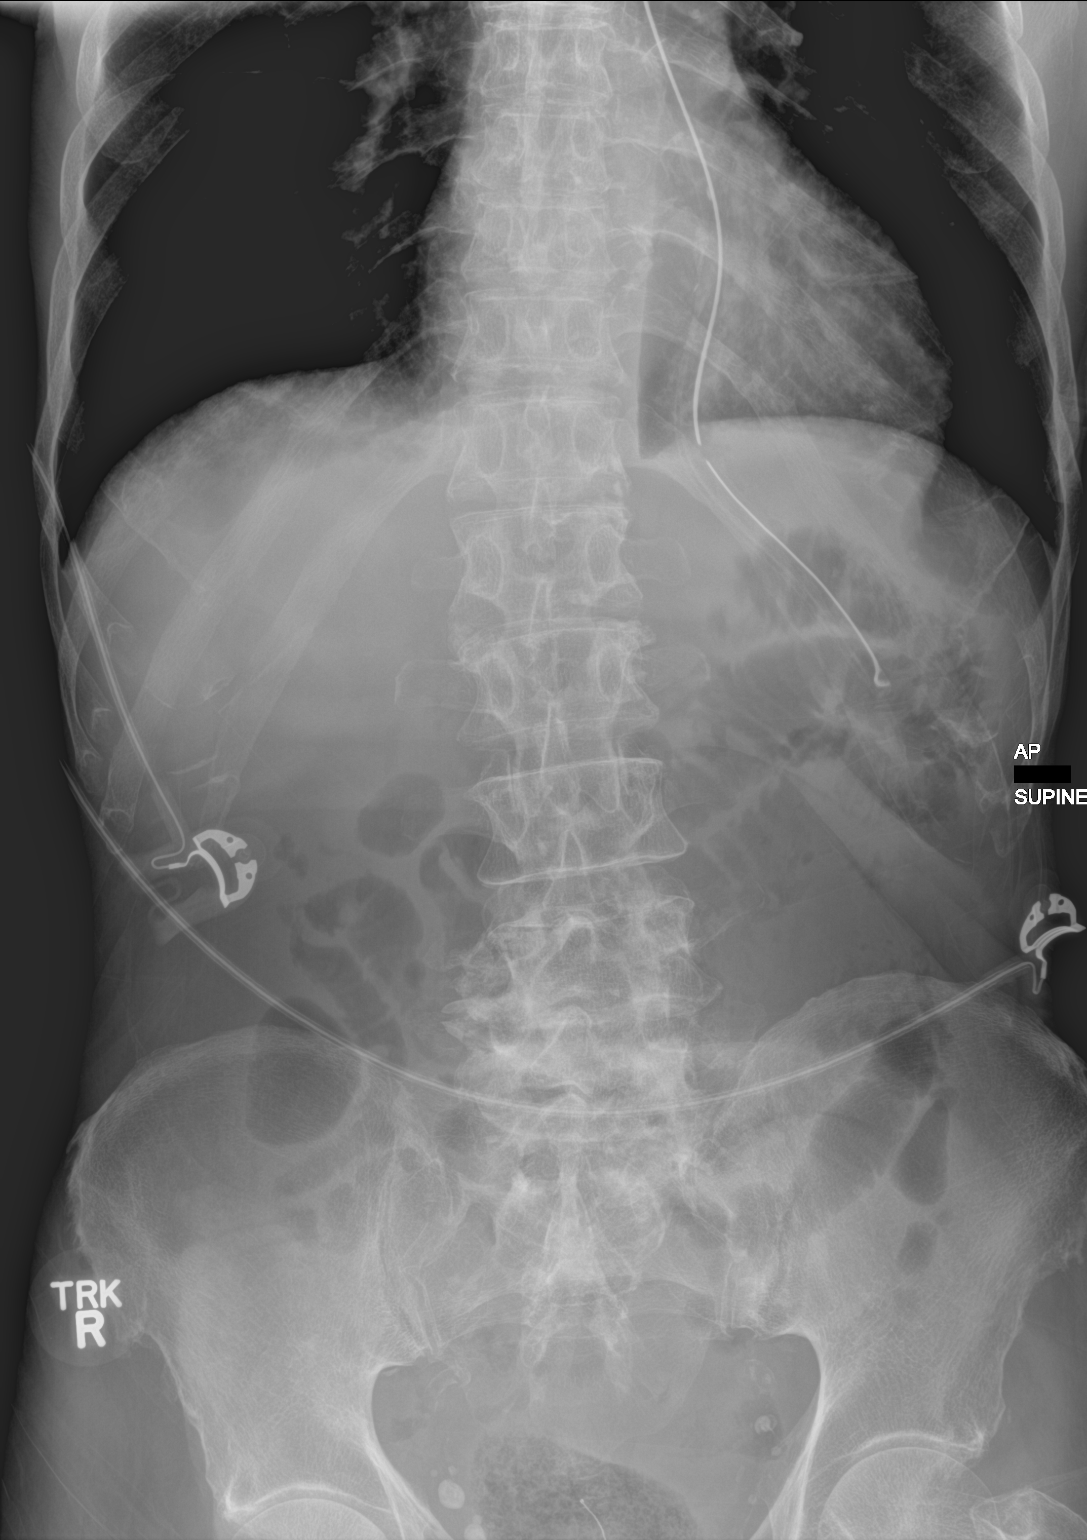

[1 of 1 positions shown; findings below may reference images not displayed]

FINDINGS: Orogastric tube is seen with the side port at the GE junction in the
distal tip in the proximal stomach. Several mildly dilated small
bowel loops are seen in the left abdomen, with a small amount of gas
in nondilated colon. This may be due to mild ileus or partial small
bowel obstruction.
IMPRESSION: Orogastric tube side-port at the GE junction, and distal tip in the
proximal stomach.

Mild ileus versus partial small bowel obstruction.
